# Patient Record
Sex: Female | Born: 1940 | Race: White | Hispanic: No | Marital: Married | Smoking: Never smoker
Health system: Southern US, Community
[De-identification: ages and names within clinical notes are randomized; demographics above are authoritative.]

## PROBLEM LIST (undated history)

## (undated) DIAGNOSIS — F419 Anxiety disorder, unspecified: Secondary | ICD-10-CM

## (undated) DIAGNOSIS — K219 Gastro-esophageal reflux disease without esophagitis: Secondary | ICD-10-CM

## (undated) DIAGNOSIS — G629 Polyneuropathy, unspecified: Secondary | ICD-10-CM

## (undated) DIAGNOSIS — I10 Essential (primary) hypertension: Secondary | ICD-10-CM

## (undated) DIAGNOSIS — I2510 Atherosclerotic heart disease of native coronary artery without angina pectoris: Secondary | ICD-10-CM

## (undated) DIAGNOSIS — M1990 Unspecified osteoarthritis, unspecified site: Secondary | ICD-10-CM

## (undated) DIAGNOSIS — T7840XA Allergy, unspecified, initial encounter: Secondary | ICD-10-CM

## (undated) DIAGNOSIS — J45909 Unspecified asthma, uncomplicated: Secondary | ICD-10-CM

## (undated) DIAGNOSIS — C801 Malignant (primary) neoplasm, unspecified: Secondary | ICD-10-CM

## (undated) DIAGNOSIS — F0390 Unspecified dementia without behavioral disturbance: Secondary | ICD-10-CM

## (undated) HISTORY — PX: ABDOMINAL HYSTERECTOMY: SHX81

## (undated) HISTORY — DX: Unspecified asthma, uncomplicated: J45.909

## (undated) HISTORY — DX: Polyneuropathy, unspecified: G62.9

## (undated) HISTORY — DX: Atherosclerotic heart disease of native coronary artery without angina pectoris: I25.10

## (undated) HISTORY — DX: Allergy, unspecified, initial encounter: T78.40XA

## (undated) HISTORY — DX: Gastro-esophageal reflux disease without esophagitis: K21.9

## (undated) HISTORY — DX: Unspecified osteoarthritis, unspecified site: M19.90

---

## 1965-06-25 HISTORY — PX: APPENDECTOMY: SHX54

## 2004-11-27 ENCOUNTER — Ambulatory Visit: Payer: Self-pay | Admitting: Internal Medicine

## 2005-05-24 ENCOUNTER — Ambulatory Visit: Payer: Self-pay | Admitting: Gastroenterology

## 2011-12-04 ENCOUNTER — Ambulatory Visit: Payer: Self-pay | Admitting: Ophthalmology

## 2011-12-18 ENCOUNTER — Ambulatory Visit: Payer: Self-pay | Admitting: Ophthalmology

## 2012-01-15 ENCOUNTER — Ambulatory Visit: Payer: Self-pay | Admitting: Ophthalmology

## 2012-08-28 ENCOUNTER — Encounter: Payer: Self-pay | Admitting: Internal Medicine

## 2012-08-28 ENCOUNTER — Ambulatory Visit (INDEPENDENT_AMBULATORY_CARE_PROVIDER_SITE_OTHER): Payer: Medicare Other | Admitting: Internal Medicine

## 2012-08-28 VITALS — BP 160/90 | HR 84 | Temp 97.8°F | Ht 61.5 in | Wt 131.5 lb

## 2012-08-28 DIAGNOSIS — K219 Gastro-esophageal reflux disease without esophagitis: Secondary | ICD-10-CM

## 2012-08-30 ENCOUNTER — Encounter: Payer: Self-pay | Admitting: Internal Medicine

## 2012-08-30 DIAGNOSIS — Z9109 Other allergy status, other than to drugs and biological substances: Secondary | ICD-10-CM | POA: Insufficient documentation

## 2012-08-30 DIAGNOSIS — Z91048 Other nonmedicinal substance allergy status: Secondary | ICD-10-CM | POA: Insufficient documentation

## 2012-08-30 NOTE — Assessment & Plan Note (Signed)
Had EGD previously.  Symptoms controlled.

## 2012-08-30 NOTE — Assessment & Plan Note (Signed)
Low cholesterol diet and exercise.  Check lipid panel.   

## 2012-08-30 NOTE — Progress Notes (Signed)
  Subjective:    Patient ID: Patricia Pacheco, female    DOB: 01/22/1941, 72 y.o.   MRN: [id number]  HPI 71 year old female with past history of GERD, allergies and hypercholesterolemia who comes in today to follow up on these issues as well as to establish care.  She states she has been doing relatively well.  Was previously seeing Dr Barnabas Lister.  He followed her mammograms and did her pelvics.  Stays active.  No cardiac symptoms with increased activity or exertion.  Previously saw Dr Juliann Pares.  Had negative stress test.  Eating and drinking well.  No nausea or vomiting.  Bowels stable. Does have some constipation.  Drinks prune juice and this helps to keep her regular.    Past Medical History  Diagnosis Date  . Asthma   . Arthritis   . GERD (gastroesophageal reflux disease)   . Allergy     allergy shots Elenore Rota)    Outpatient Encounter Prescriptions as of 08/28/2012  Medication Sig Dispense Refill  . aspirin 81 MG tablet Take 81 mg by mouth daily.      . Calcium Carbonate-Vit D-Min (CALCIUM 1200 PO) Take by mouth daily.      . Celecoxib (CELEBREX PO) Take by mouth as needed.      . COD LIVER OIL PO Take by mouth.      . Multiple Vitamin (MULTIVITAMIN) tablet Take 1 tablet by mouth daily.      . Naproxen Sodium (ALEVE PO) Take by mouth as needed.      . [DISCONTINUED] calcium & magnesium carbonates (MYLANTA) 311-232 MG per tablet Take 1 tablet by mouth daily.       No facility-administered encounter medications on file as of 08/28/2012.    Review of Systems Patient denies any headache, lightheadedness or dizziness.  Sees Dr Elenore Rota.  On allergy shots.  Does have frequent nose bleeds.  Seeing Dr Elenore Rota.  No chest pain, tightness or palpitations.  No increased shortness of breath, cough or congestion.  No nausea or vomiting.  No abdominal pain or cramping.  No bowel change, such as diarrhea, BRBPR or melana.  Constipation controlled with prune juice.  No urine change.        Objective:   Physical Exam Filed Vitals:   08/28/12 1314  BP: 160/90  Pulse: 84  Temp: 97.8 F (36.6 C)   Blood pressure recheck:  61/45  72 year old female in no acute distress.   HEENT:  Nares- clear.  Oropharynx - without lesions. NECK:  Supple.  Nontender.  No audible bruit.  HEART:  Appears to be regular. LUNGS:  No crackles or wheezing audible.  Respirations even and unlabored.  RADIAL PULSE:  Equal bilaterally.  ABDOMEN:  Soft, nontender.  Bowel sounds present and normal.  No audible abdominal bruit.  EXTREMITIES:  No increased edema present.  DP pulses palpable and equal bilaterally.          Assessment & Plan:  CARDIOVASCULAR.  Currently doing well.  Follow.  Had previous negative stress test.  FATIGUE.  Check cbc, met c and tsh.   GYN.  Previously followed by Dr Barnabas Lister.  Obtain records.  Schedule her to return for physical.   HEALTH MAINTENANCE.  Schedule a physical next visit.  Schedule mammogram.  Discussed need for colonoscopy.

## 2012-08-30 NOTE — Assessment & Plan Note (Signed)
Receiving allergy shots.  Seeing Dr Elenore Rota.  He is following her for her frequent nose bleeds.

## 2012-09-04 ENCOUNTER — Other Ambulatory Visit (INDEPENDENT_AMBULATORY_CARE_PROVIDER_SITE_OTHER): Payer: Medicare Other

## 2012-09-04 DIAGNOSIS — E7800 Pure hypercholesterolemia, unspecified: Secondary | ICD-10-CM

## 2012-09-04 DIAGNOSIS — R5383 Other fatigue: Secondary | ICD-10-CM

## 2012-09-04 LAB — COMPREHENSIVE METABOLIC PANEL
Albumin: 3.8 g/dL (ref 3.5–5.2)
Alkaline Phosphatase: 81 U/L (ref 39–117)
BUN: 20 mg/dL (ref 6–23)
Creatinine, Ser: 0.9 mg/dL (ref 0.4–1.2)
Glucose, Bld: 94 mg/dL (ref 70–99)
Potassium: 4.2 mEq/L (ref 3.5–5.1)
Total Bilirubin: 0.6 mg/dL (ref 0.3–1.2)

## 2012-09-04 LAB — CBC WITH DIFFERENTIAL/PLATELET
Basophils Relative: 0.4 % (ref 0.0–3.0)
Eosinophils Relative: 2.4 % (ref 0.0–5.0)
HCT: 36.4 % (ref 36.0–46.0)
Hemoglobin: 12.3 g/dL (ref 12.0–15.0)
Lymphs Abs: 1.4 10*3/uL (ref 0.7–4.0)
MCV: 88.8 fl (ref 78.0–100.0)
Monocytes Absolute: 0.3 10*3/uL (ref 0.1–1.0)
Neutro Abs: 3.2 10*3/uL (ref 1.4–7.7)
RBC: 4.1 Mil/uL (ref 3.87–5.11)
WBC: 5.1 10*3/uL (ref 4.5–10.5)

## 2012-09-04 LAB — LIPID PANEL
Cholesterol: 202 mg/dL — ABNORMAL HIGH (ref 0–200)
Total CHOL/HDL Ratio: 3
Triglycerides: 58 mg/dL (ref 0.0–149.0)

## 2012-09-05 ENCOUNTER — Encounter: Payer: Self-pay | Admitting: Internal Medicine

## 2012-09-05 DIAGNOSIS — M8580 Other specified disorders of bone density and structure, unspecified site: Secondary | ICD-10-CM

## 2012-09-18 ENCOUNTER — Telehealth: Payer: Self-pay | Admitting: Internal Medicine

## 2012-09-18 NOTE — Telephone Encounter (Signed)
Please advise 

## 2012-09-18 NOTE — Telephone Encounter (Signed)
Pt came in today brought her records from dr Barnabas Lister,  Pt also stated dr Lorin Picket was going to call her in a generic xanax  [facility]  [address]

## 2012-09-19 MED ORDER — ALPRAZOLAM 0.25 MG PO TABS: 0.2500 mg | ORAL_TABLET | ORAL | Status: DC | PRN

## 2012-09-19 NOTE — Telephone Encounter (Signed)
Need to confirm with pt that she has taken xanax previously.  (how often takes)?

## 2012-09-19 NOTE — Telephone Encounter (Signed)
Patient stated that she took the generic for xanax, the lowest dose, and it was prn.

## 2012-09-19 NOTE — Telephone Encounter (Signed)
Called in xanax .25mg  q day prn #20 with no refills

## 2012-10-02 ENCOUNTER — Encounter: Payer: Self-pay | Admitting: Internal Medicine

## 2012-10-10 ENCOUNTER — Encounter: Payer: Self-pay | Admitting: Internal Medicine

## 2012-10-10 DIAGNOSIS — M949 Disorder of cartilage, unspecified: Secondary | ICD-10-CM

## 2012-10-10 DIAGNOSIS — M8580 Other specified disorders of bone density and structure, unspecified site: Secondary | ICD-10-CM | POA: Insufficient documentation

## 2012-10-10 DIAGNOSIS — M899 Disorder of bone, unspecified: Secondary | ICD-10-CM | POA: Insufficient documentation

## 2012-10-13 ENCOUNTER — Encounter: Payer: Self-pay | Admitting: Internal Medicine

## 2012-10-13 ENCOUNTER — Ambulatory Visit (INDEPENDENT_AMBULATORY_CARE_PROVIDER_SITE_OTHER): Payer: Medicare Other | Admitting: Internal Medicine

## 2012-10-13 ENCOUNTER — Ambulatory Visit (INDEPENDENT_AMBULATORY_CARE_PROVIDER_SITE_OTHER)
Admission: RE | Admit: 2012-10-13 | Discharge: 2012-10-13 | Disposition: A | Discharge status: Home / Self Care | Payer: Medicare Other | Source: Ambulatory Visit | Attending: Internal Medicine | Admitting: Internal Medicine

## 2012-10-13 ENCOUNTER — Other Ambulatory Visit (HOSPITAL_COMMUNITY)
Admission: RE | Admit: 2012-10-13 | Discharge: 2012-10-13 | Disposition: A | Discharge status: Home / Self Care | Payer: Medicare Other | Source: Ambulatory Visit | Attending: Internal Medicine | Admitting: Internal Medicine

## 2012-10-13 VITALS — BP 142/78 | HR 78 | Temp 98.3°F | Resp 18 | Ht 62.0 in | Wt 133.5 lb

## 2012-10-13 DIAGNOSIS — M549 Dorsalgia, unspecified: Secondary | ICD-10-CM

## 2012-10-13 DIAGNOSIS — M949 Disorder of cartilage, unspecified: Secondary | ICD-10-CM

## 2012-10-13 DIAGNOSIS — M25552 Pain in left hip: Secondary | ICD-10-CM

## 2012-10-13 DIAGNOSIS — E7800 Pure hypercholesterolemia, unspecified: Secondary | ICD-10-CM

## 2012-10-13 DIAGNOSIS — M8580 Other specified disorders of bone density and structure, unspecified site: Secondary | ICD-10-CM

## 2012-10-13 DIAGNOSIS — M25559 Pain in unspecified hip: Secondary | ICD-10-CM

## 2012-10-13 DIAGNOSIS — Z9109 Other allergy status, other than to drugs and biological substances: Secondary | ICD-10-CM

## 2012-10-13 DIAGNOSIS — Z124 Encounter for screening for malignant neoplasm of cervix: Secondary | ICD-10-CM | POA: Insufficient documentation

## 2012-10-13 DIAGNOSIS — Z0000 Encounter for general adult medical examination without abnormal findings: Secondary | ICD-10-CM

## 2012-10-13 DIAGNOSIS — K219 Gastro-esophageal reflux disease without esophagitis: Secondary | ICD-10-CM

## 2012-10-16 ENCOUNTER — Encounter: Payer: Self-pay | Admitting: *Deleted

## 2012-10-17 ENCOUNTER — Telehealth: Payer: Self-pay | Admitting: *Deleted

## 2012-10-20 ENCOUNTER — Encounter: Payer: Self-pay | Admitting: Internal Medicine

## 2012-10-20 NOTE — Telephone Encounter (Signed)
error 

## 2012-10-20 NOTE — Progress Notes (Signed)
Subjective:    Patient ID: Patricia Pacheco, female    DOB: [birth year], 72 y.o.   MRN: [id number]  HPI 72 year old female with past history of GERD, allergies and hypercholesterolemia who comes in today to follow up on these issues as well as for a complete physical exam.  She states she has been doing relatively well.  Stays active.  No cardiac symptoms with increased activity or exertion.  Previously saw Dr Juliann Pares.  Had negative stress test.  Eating and drinking well.  No nausea or vomiting.  Bowels stable. Does have some constipation.  Drinks prune juice and this helps to keep her regular.  She stands on her feet a lot.  Describes a burning/stinging sensation in her feet.  Described as hot coals.  Some left hip pain.  She also reports since cataract surgery, her eyes have felt dry.  No dry mouth.   Seeing Dr Elenore Rota.  Receiving allergy shots.  No cough or congestion.     Past Medical History  Diagnosis Date  . Asthma   . Arthritis   . GERD (gastroesophageal reflux disease)     EGD (05/25/04) - slight presbyesophagus, mild esophagitis, mild gastritis, mild duodenitis - positive H. pylori.   . Allergy     allergy shots (Juengel)  . Neuropathy   . CAD (coronary artery disease)     cath 11/27/04 - moderated LAD disease    Outpatient Encounter Prescriptions as of 10/13/2012  Medication Sig Dispense Refill  . ALPRAZolam (XANAX) 0.25 MG tablet Take 1 tablet (0.25 mg total) by mouth every three (3) days as needed for sleep.  20 tablet  0  . aspirin 81 MG tablet Take 81 mg by mouth daily.      . Calcium Carbonate-Vit D-Min (CALCIUM 1200 PO) Take by mouth daily.      . Celecoxib (CELEBREX PO) Take by mouth as needed.      . COD LIVER OIL PO Take by mouth.      . Multiple Vitamin (MULTIVITAMIN) tablet Take 1 tablet by mouth daily.      . Naproxen Sodium (ALEVE PO) Take by mouth as needed.       No facility-administered encounter medications on file as of 10/13/2012.    Review of Systems Patient  denies any headache, lightheadedness or dizziness.  Sees Dr Elenore Rota.  On allergy shots.  Does have frequent nose bleeds.  Seeing Dr Elenore Rota.  No chest pain, tightness or palpitations.  No increased shortness of breath, cough or congestion.  No nausea or vomiting.  No abdominal pain or cramping.  No bowel change, such as diarrhea, BRBPR or melana.  Constipation controlled with prune juice.  No urine change.  Left hip pain.  Feet hot/burning sensation.  On her feet a lot.       Objective:   Physical Exam  Filed Vitals:   10/13/12 1329  BP: 142/78  Pulse: 78  Temp: 98.3 F (36.8 C)  Resp: 7   72 year old female in no acute distress.   HEENT:  Nares- clear.  Oropharynx - without lesions. NECK:  Supple.  Nontender.  No audible bruit.  HEART:  Appears to be regular. LUNGS:  No crackles or wheezing audible.  Respirations even and unlabored.  RADIAL PULSE:  Equal bilaterally.    BREASTS:  No nipple discharge or nipple retraction present.  Could not appreciate any distinct nodules or axillary adenopathy.  ABDOMEN:  Soft, nontender.  Bowel sounds present and normal.  No  audible abdominal bruit.  GU:  Normal external genitalia.  Vaginal vault without lesions.  Cervix identified.  Pap performed. Could not appreciate any adnexal masses or tenderness.   RECTAL:  Heme negative.   EXTREMITIES:  No increased edema present.  DP pulses palpable and equal bilaterally. NEURO:  Minimal decreased sensation toes/feet compared to legs.            Assessment & Plan:  CARDIOVASCULAR.  Currently doing well.  Follow.  Had previous negative stress test.  GYN.  Previously followed by Dr Barnabas Lister.  Pelvic today.    MSK.  Left hip pain.  Will check L-S spine xray and left hip xray.  Tylenol as directed.  Follow.  May need ortho referral or Pt referral.  With the foot burning/stinging.  Wants to hold on more medication.  Support hose.  Follow.      HEALTH MAINTENANCE.  Physical today.  Mammogram 09/04/12 - Birads  I.  Discussed need for colonoscopy.

## 2012-10-20 NOTE — Assessment & Plan Note (Signed)
Low cholesterol diet and exercise.  Follow lipid panel.   

## 2012-10-20 NOTE — Assessment & Plan Note (Signed)
Had EGD previously.  Symptoms controlled.

## 2012-10-20 NOTE — Assessment & Plan Note (Signed)
Receiving allergy shots.  Seeing Dr Elenore Rota.  He is following her for her frequent nose bleeds.

## 2012-10-20 NOTE — Assessment & Plan Note (Signed)
Calcium and vitamin D.  Follow.    

## 2012-10-21 ENCOUNTER — Other Ambulatory Visit (INDEPENDENT_AMBULATORY_CARE_PROVIDER_SITE_OTHER): Payer: Medicare Other

## 2012-10-21 DIAGNOSIS — Z1211 Encounter for screening for malignant neoplasm of colon: Secondary | ICD-10-CM

## 2012-10-22 ENCOUNTER — Other Ambulatory Visit: Payer: Self-pay | Admitting: *Deleted

## 2012-10-22 DIAGNOSIS — Z1211 Encounter for screening for malignant neoplasm of colon: Secondary | ICD-10-CM

## 2012-11-14 ENCOUNTER — Ambulatory Visit (INDEPENDENT_AMBULATORY_CARE_PROVIDER_SITE_OTHER): Payer: Medicare Other | Admitting: Internal Medicine

## 2012-11-14 ENCOUNTER — Encounter: Payer: Self-pay | Admitting: Internal Medicine

## 2012-11-14 VITALS — BP 120/70 | HR 79 | Temp 98.4°F | Ht 62.0 in | Wt 133.0 lb

## 2012-11-14 DIAGNOSIS — E7800 Pure hypercholesterolemia, unspecified: Secondary | ICD-10-CM

## 2012-11-14 DIAGNOSIS — M8580 Other specified disorders of bone density and structure, unspecified site: Secondary | ICD-10-CM

## 2012-11-14 DIAGNOSIS — M949 Disorder of cartilage, unspecified: Secondary | ICD-10-CM

## 2012-11-14 DIAGNOSIS — R030 Elevated blood-pressure reading, without diagnosis of hypertension: Secondary | ICD-10-CM

## 2012-11-14 DIAGNOSIS — K219 Gastro-esophageal reflux disease without esophagitis: Secondary | ICD-10-CM

## 2012-11-14 DIAGNOSIS — Z9109 Other allergy status, other than to drugs and biological substances: Secondary | ICD-10-CM

## 2012-11-16 ENCOUNTER — Encounter: Payer: Self-pay | Admitting: Internal Medicine

## 2012-11-16 DIAGNOSIS — R030 Elevated blood-pressure reading, without diagnosis of hypertension: Secondary | ICD-10-CM | POA: Insufficient documentation

## 2012-11-16 NOTE — Progress Notes (Signed)
Subjective:    Patient ID: Patricia Pacheco, female    DOB: Feb 21, 1941, 72 y.o.   MRN: [id number]  HPI 72 year old female with past history of GERD, allergies and hypercholesterolemia who comes in today for a scheduled follow up.  She states she has been doing relatively well.  Stays active.  No cardiac symptoms with increased activity or exertion.  Previously saw Dr Juliann Pares.  Had negative stress test.  Had a heart catheterization seven years ago.  States everything checked out fine.  Eating and drinking well.  No nausea or vomiting.  Bowels stable.  She stands on her feet a lot.  Still having foot pain.  Has seen Dr Charlsie Merles, Dr Orland Jarred and Dr Al Corpus.  No improvement.  Would like to see a chiropractor - Dr Jeannetta Ellis.  States she was told he could help her feet.  Still some left hip pain.  She feels like this is being aggravated by her feet.  Feels if she could get something to help her feet, her hip pain would resolve.  Desires no therapy for her back or hip.  Previous xray revealed some arthritis.  Seeing Dr Elenore Rota.  Receiving allergy shots.  No cough or congestion.     Past Medical History  Diagnosis Date  . Asthma   . Arthritis   . GERD (gastroesophageal reflux disease)     EGD (05/25/04) - slight presbyesophagus, mild esophagitis, mild gastritis, mild duodenitis - positive H. pylori.   . Allergy     allergy shots (Juengel)  . Neuropathy   . CAD (coronary artery disease)     cath 11/27/04 - moderated LAD disease    Outpatient Encounter Prescriptions as of 11/14/2012  Medication Sig Dispense Refill  . ALPRAZolam (XANAX) 0.25 MG tablet Take 1 tablet (0.25 mg total) by mouth every three (3) days as needed for sleep.  20 tablet  0  . aspirin 81 MG tablet Take 81 mg by mouth daily.      . Calcium Carbonate-Vit D-Min (CALCIUM 1200 PO) Take by mouth daily.      . Celecoxib (CELEBREX PO) Take by mouth as needed.      . COD LIVER OIL PO Take by mouth.      . fexofenadine (ALLEGRA) 180 MG tablet Take 180 mg  by mouth daily.      . Multiple Vitamin (MULTIVITAMIN) tablet Take 1 tablet by mouth daily.      . Naproxen Sodium (ALEVE PO) Take by mouth as needed.       No facility-administered encounter medications on file as of 11/14/2012.    Review of Systems Patient denies any headache, lightheadedness or dizziness.  Sees Dr Elenore Rota.  On allergy shots.  Does have frequent nose bleeds.  Seeing Dr Elenore Rota.  No chest pain, tightness or palpitations.  No increased shortness of breath, cough or congestion.  No nausea or vomiting.  No abdominal pain or cramping.  No bowel change, such as diarrhea, BRBPR or melana.  Constipation controlled with prune juice.  No urine change.  Left hip pain as outlined.  Desires no further intervention.  Feet still bother her.  Plans to see a chiropractor.   Overall she feels she is doing well. Blood pressures on outside checks averaging 120-140/70-80.       Objective:   Physical Exam  Filed Vitals:   11/14/12 1429  BP: 120/70  Pulse: 79  Temp: 98.4 F (36.9 C)   Blood pressure recheck: 40/2  72 year old female  in no acute distress.   HEENT:  Nares- clear.  Oropharynx - without lesions. NECK:  Supple.  Nontender.  No audible bruit.  HEART:  Appears to be regular. LUNGS:  No crackles or wheezing audible.  Respirations even and unlabored.  RADIAL PULSE:  Equal bilaterally.   ABDOMEN:  Soft, nontender.  Bowel sounds present and normal.  No audible abdominal bruit.    EXTREMITIES:  No increased edema present.  DP pulses palpable and equal bilaterally. MSK:  Minimal pulling and discomfort in her left hip with full extension of her left leg.           Assessment & Plan:  CARDIOVASCULAR.  Currently doing well.  Follow.  Had previous negative stress test.  Also reported heart cath (seven years ago) - looked good.  Follow.   MSK.  Left hip pain.  Had left hip xray and L-S spine xray.  Some arthritis.  Discussed referral to PT.  She wants to hold on this at this time.   Tylenol as directed.  Follow.  Wants to see the chiropractor for her feet.  Feels this will help her hip.       HEALTH MAINTENANCE.  Physical 10/13/12.  Mammogram 09/04/12 - Birads I.  Discussed need for colonoscopy last visit.

## 2012-11-16 NOTE — Assessment & Plan Note (Signed)
Receiving allergy shots.  Seeing Dr Juengel.  He is following her for her frequent nose bleeds.     

## 2012-11-16 NOTE — Assessment & Plan Note (Signed)
Blood pressure as outlined.  Continue to spot check - outside office.  Hold on medication.  Follow.

## 2012-11-16 NOTE — Assessment & Plan Note (Signed)
Had EGD previously.  Symptoms controlled.  

## 2012-11-16 NOTE — Assessment & Plan Note (Signed)
Low cholesterol diet and exercise.  Follow lipid panel.  Lipid panel checked 3/131/4 revealed total cholesterol 202, triglycerides 58, HDL 65 and LDL 115.    

## 2012-11-16 NOTE — Assessment & Plan Note (Signed)
Calcium and vitamin D.  Follow.

## 2013-01-20 ENCOUNTER — Ambulatory Visit: Payer: Medicare Other | Admitting: Pulmonary Disease

## 2013-02-17 ENCOUNTER — Ambulatory Visit: Payer: Medicare Other | Admitting: Internal Medicine

## 2013-04-14 ENCOUNTER — Ambulatory Visit: Payer: Medicare Other | Admitting: Internal Medicine

## 2013-04-23 ENCOUNTER — Ambulatory Visit (INDEPENDENT_AMBULATORY_CARE_PROVIDER_SITE_OTHER): Payer: Medicare Other | Admitting: Internal Medicine

## 2013-04-23 ENCOUNTER — Encounter: Payer: Self-pay | Admitting: Internal Medicine

## 2013-04-23 VITALS — BP 120/80 | HR 84 | Temp 98.5°F | Ht 62.0 in | Wt 128.8 lb

## 2013-04-23 DIAGNOSIS — M25552 Pain in left hip: Secondary | ICD-10-CM

## 2013-04-23 DIAGNOSIS — R030 Elevated blood-pressure reading, without diagnosis of hypertension: Secondary | ICD-10-CM

## 2013-04-23 DIAGNOSIS — Z9109 Other allergy status, other than to drugs and biological substances: Secondary | ICD-10-CM

## 2013-04-23 DIAGNOSIS — M25561 Pain in right knee: Secondary | ICD-10-CM

## 2013-04-23 DIAGNOSIS — E7800 Pure hypercholesterolemia, unspecified: Secondary | ICD-10-CM

## 2013-04-23 DIAGNOSIS — M899 Disorder of bone, unspecified: Secondary | ICD-10-CM

## 2013-04-23 DIAGNOSIS — IMO0001 Reserved for inherently not codable concepts without codable children: Secondary | ICD-10-CM

## 2013-04-23 DIAGNOSIS — M25569 Pain in unspecified knee: Secondary | ICD-10-CM

## 2013-04-23 DIAGNOSIS — M8580 Other specified disorders of bone density and structure, unspecified site: Secondary | ICD-10-CM

## 2013-04-23 DIAGNOSIS — K219 Gastro-esophageal reflux disease without esophagitis: Secondary | ICD-10-CM

## 2013-04-23 DIAGNOSIS — M25559 Pain in unspecified hip: Secondary | ICD-10-CM

## 2013-04-26 ENCOUNTER — Encounter: Payer: Self-pay | Admitting: Internal Medicine

## 2013-04-26 NOTE — Assessment & Plan Note (Signed)
Low cholesterol diet and exercise.  Follow lipid panel.  Lipid panel checked 3/131/4 revealed total cholesterol 202, triglycerides 58, HDL 65 and LDL 115.    

## 2013-04-26 NOTE — Progress Notes (Signed)
Subjective:    Patient ID: Patricia Pacheco, female    DOB: August 21, 1940, 72 y.o.   MRN: [id number]  HPI 72 year old female with past history of GERD, allergies and hypercholesterolemia who comes in today for a scheduled follow up.  She states she has been doing relatively well.  Stays active.  No cardiac symptoms with increased activity or exertion.  Previously saw Dr Juliann Pares.  Had negative stress test.  Had a heart catheterization seven years ago.  States everything checked out fine.  Eating and drinking well.  No nausea or vomiting.  Bowels stable.  She stands on her feet a lot.  Still having foot pain.  Has seen Dr Charlsie Merles, Dr Orland Jarred and Dr Al Corpus.  No improvement.  Would like to see a chiropractor - Dr Jeannetta Ellis.  States she was told he could help her feet.  She is contemplating going to see him.  Still some left hip pain.   Previous xray revealed some arthritis.  Seeing Dr Elenore Rota.  Receiving allergy shots.  No cough or congestion.  Overall doing relatively well.    Past Medical History  Diagnosis Date  . Asthma   . Arthritis   . GERD (gastroesophageal reflux disease)     EGD (05/25/04) - slight presbyesophagus, mild esophagitis, mild gastritis, mild duodenitis - positive H. pylori.   . Allergy     allergy shots (Juengel)  . Neuropathy   . CAD (coronary artery disease)     cath 11/27/04 - moderated LAD disease    Outpatient Encounter Prescriptions as of 04/23/2013  Medication Sig  . ALPRAZolam (XANAX) 0.25 MG tablet Take 1 tablet (0.25 mg total) by mouth every three (3) days as needed for sleep.  Marland Kitchen amoxicillin (AMOXIL) 875 MG tablet Take 875 mg by mouth 2 (two) times daily.  Marland Kitchen aspirin 81 MG tablet Take 81 mg by mouth daily.  . Calcium Carbonate-Vit D-Min (CALCIUM 1200 PO) Take by mouth daily.  . Celecoxib (CELEBREX PO) Take by mouth as needed.  . COD LIVER OIL PO Take by mouth.  . fexofenadine (ALLEGRA) 180 MG tablet Take 180 mg by mouth daily.  . Multiple Vitamin (MULTIVITAMIN) tablet Take 1  tablet by mouth daily.  . Naproxen Sodium (ALEVE PO) Take by mouth as needed.  . pseudoephedrine-guaifenesin (MUCINEX D) 60-600 MG per tablet Take 1 tablet by mouth every 12 (twelve) hours.    Review of Systems Patient denies any headache, lightheadedness or dizziness.  Sees Dr Elenore Rota.  On allergy shots.   No chest pain, tightness or palpitations.  No increased shortness of breath, cough or congestion.  No nausea or vomiting.  No abdominal pain or cramping.  No bowel change, such as diarrhea, BRBPR or melana.  Constipation controlled with prune juice.  No urine change.  Left hip pain as outlined.   Feet still bother her.  Plans to see a chiropractor.   Overall she feels she is doing well.       Objective:   Physical Exam  Filed Vitals:   04/23/13 0954  BP: 120/80  Pulse: 84  Temp: 98.5 F (36.9 C)   Blood pressure recheck: 69/11  72 year old female in no acute distress.   HEENT:  Nares- clear.  Oropharynx - without lesions. NECK:  Supple.  Nontender.  No audible bruit.  HEART:  Appears to be regular. LUNGS:  No crackles or wheezing audible.  Respirations even and unlabored.  RADIAL PULSE:  Equal bilaterally.   ABDOMEN:  Soft, nontender.  Bowel sounds present and normal.  No audible abdominal bruit.    EXTREMITIES:  No increased edema present.  DP pulses palpable and equal bilaterally. MSK:  Minimal pulling and discomfort in her left hip with full extension of her left leg.           Assessment & Plan:  CARDIOVASCULAR.  Currently doing well.  Follow.  Had previous negative stress test.  Also reported heart cath (seven years ago) - looked good.  Follow.   MSK.  Left hip pain.  Had left hip xray and L-S spine xray.  Some arthritis.  Discussed referral to PT.  She was agreeable.  Referral made.         HEALTH MAINTENANCE.  Physical 10/13/12.  Mammogram 09/04/12 - Birads I.  Have discussed need for colonoscopy.

## 2013-04-26 NOTE — Assessment & Plan Note (Signed)
Had EGD previously.  Symptoms controlled.

## 2013-04-26 NOTE — Assessment & Plan Note (Signed)
Receiving allergy shots.  Seeing Dr Elenore Rota.  He is following her for her frequent nose bleeds.  Symptoms controlled.

## 2013-04-26 NOTE — Assessment & Plan Note (Signed)
Blood pressure as outlined.  Continue to spot check - outside office.  Hold on medication.  Follow.  Better today.     

## 2013-04-26 NOTE — Assessment & Plan Note (Signed)
Continue vitamin D.  Follow.    

## 2013-06-23 ENCOUNTER — Telehealth: Payer: Self-pay | Admitting: Emergency Medicine

## 2013-06-23 ENCOUNTER — Ambulatory Visit: Payer: Medicare Other

## 2013-06-23 ENCOUNTER — Encounter (INDEPENDENT_AMBULATORY_CARE_PROVIDER_SITE_OTHER): Payer: Self-pay

## 2013-06-23 DIAGNOSIS — Z23 Encounter for immunization: Secondary | ICD-10-CM

## 2013-06-23 NOTE — Telephone Encounter (Signed)
Pt insurance changing to [employer]. Pt needs referral for [facility], [facility] and [facility]. Please advise as we do not have any referrals on file for the pt. We will also need a progress note or some documentation of why the patient needs to be seen by these other MD's.

## 2013-06-24 NOTE — Telephone Encounter (Signed)
See my last note.

## 2013-06-29 NOTE — Telephone Encounter (Signed)
Noted.  Just let me know what I need to do.

## 2013-06-29 NOTE — Telephone Encounter (Signed)
Referral for [facility] for the below MD's are in progress.

## 2013-07-02 NOTE — Telephone Encounter (Signed)
Patient has been approved to see Dr. Francisca December, 4 visits and the referral exp on 09/28/13. Patient was approved to see Dr. Kathyrn Sheriff for 4 visits and its exp 09/28/13. Pt does not need referral for eye glasses or eye exam.

## 2013-07-22 ENCOUNTER — Encounter: Payer: Self-pay | Admitting: Internal Medicine

## 2013-07-22 ENCOUNTER — Telehealth: Payer: Self-pay | Admitting: Internal Medicine

## 2013-07-22 ENCOUNTER — Ambulatory Visit (INDEPENDENT_AMBULATORY_CARE_PROVIDER_SITE_OTHER): Payer: Medicare HMO | Admitting: Internal Medicine

## 2013-07-22 VITALS — BP 130/90 | HR 81 | Temp 98.4°F | Ht 62.0 in | Wt 127.5 lb

## 2013-07-22 DIAGNOSIS — IMO0001 Reserved for inherently not codable concepts without codable children: Secondary | ICD-10-CM

## 2013-07-22 DIAGNOSIS — M25552 Pain in left hip: Secondary | ICD-10-CM

## 2013-07-22 DIAGNOSIS — M899 Disorder of bone, unspecified: Secondary | ICD-10-CM

## 2013-07-22 DIAGNOSIS — Z1211 Encounter for screening for malignant neoplasm of colon: Secondary | ICD-10-CM

## 2013-07-22 DIAGNOSIS — M79673 Pain in unspecified foot: Secondary | ICD-10-CM

## 2013-07-22 DIAGNOSIS — M949 Disorder of cartilage, unspecified: Secondary | ICD-10-CM

## 2013-07-22 DIAGNOSIS — M79609 Pain in unspecified limb: Secondary | ICD-10-CM

## 2013-07-22 DIAGNOSIS — Z9109 Other allergy status, other than to drugs and biological substances: Secondary | ICD-10-CM

## 2013-07-22 DIAGNOSIS — K219 Gastro-esophageal reflux disease without esophagitis: Secondary | ICD-10-CM

## 2013-07-22 DIAGNOSIS — R030 Elevated blood-pressure reading, without diagnosis of hypertension: Secondary | ICD-10-CM

## 2013-07-22 DIAGNOSIS — M8580 Other specified disorders of bone density and structure, unspecified site: Secondary | ICD-10-CM

## 2013-07-22 DIAGNOSIS — M25559 Pain in unspecified hip: Secondary | ICD-10-CM

## 2013-07-22 DIAGNOSIS — E7800 Pure hypercholesterolemia, unspecified: Secondary | ICD-10-CM

## 2013-07-22 MED ORDER — CELECOXIB 200 MG PO CAPS: 200.0000 mg | ORAL_CAPSULE | Freq: Every day | ORAL | Status: DC | PRN

## 2013-07-22 MED ORDER — ALPRAZOLAM 0.25 MG PO TABS: 0.2500 mg | ORAL_TABLET | Freq: Every day | ORAL | Status: DC | PRN

## 2013-07-22 NOTE — Progress Notes (Signed)
Pre-visit discussion using our clinic review tool. No additional management support is needed unless otherwise documented below in the visit note.  

## 2013-07-22 NOTE — Progress Notes (Signed)
Subjective:    Patient ID: Patricia Pacheco, female    DOB: Dec 17, 1940, 73 y.o.   MRN: [id number]  HPI 73 year old female with past history of GERD, allergies and hypercholesterolemia who comes in today for a scheduled follow up.  She states she has been doing relatively well.  Stays active.  No cardiac symptoms with increased activity or exertion.  Previously saw Dr Clayborn Bigness.  Had negative stress test.  Had a heart catheterization seven years ago.  States everything checked out fine.  Eating and drinking well.  No nausea or vomiting.  Bowels stable.  She stands on her feet a lot.  Still having foot pain.  Has seen Dr Paulla Dolly, Dr Elvina Mattes and Dr Milinda Pointer.  No improvement.  Now seeing a chiropractor - Dr Joyce Copa.  He is helping her pain.  He has been working with her hip.  Also has ordered pads for her feet.  Due to come in soon.  She notices improvement in her hip, until she is on her feet.  She is hoping if he can continue to work with her hip and help her with the pads and her feet, that she will note continued improvement.   Seeing Dr Kathyrn Sheriff.  Receiving allergy shots.  No cough or congestion.  Overall doing relatively well.  Recently evaluated by ENT.  Recommended sudafed for her congestion and stuffiness.  Was started on omeprazole for acid reflux.  Doing better.     Past Medical History  Diagnosis Date  . Asthma   . Arthritis   . GERD (gastroesophageal reflux disease)     EGD (05/25/04) - slight presbyesophagus, mild esophagitis, mild gastritis, mild duodenitis - positive H. pylori.   . Allergy     allergy shots (Juengel)  . Neuropathy   . CAD (coronary artery disease)     cath 11/27/04 - moderated LAD disease    Outpatient Encounter Prescriptions as of 07/22/2013  Medication Sig  . ALPRAZolam (XANAX) 0.25 MG tablet Take 1 tablet (0.25 mg total) by mouth every three (3) days as needed for sleep.  Marland Kitchen aspirin 81 MG tablet Take 81 mg by mouth daily.  . Calcium Carbonate-Vit D-Min (CALCIUM 1200 PO) Take by  mouth daily.  . Celecoxib (CELEBREX PO) Take by mouth as needed.  . COD LIVER OIL PO Take by mouth.  . fexofenadine (ALLEGRA) 180 MG tablet Take 180 mg by mouth daily.  . Multiple Vitamin (MULTIVITAMIN) tablet Take 1 tablet by mouth daily.  . Naproxen Sodium (ALEVE PO) Take by mouth as needed.  Marland Kitchen omeprazole (PRILOSEC) 40 MG capsule Take 40 mg by mouth daily.  . pseudoephedrine (SUDAFED) 60 MG tablet Take 60 mg by mouth every 4 (four) hours as needed for congestion.  . [DISCONTINUED] amoxicillin (AMOXIL) 875 MG tablet Take 875 mg by mouth 2 (two) times daily.  . [DISCONTINUED] pseudoephedrine-guaifenesin (MUCINEX D) 60-600 MG per tablet Take 1 tablet by mouth every 12 (twelve) hours.    Review of Systems Patient denies any headache, lightheadedness or dizziness.  Sees Dr Kathyrn Sheriff.  On allergy shots.   No chest pain, tightness or palpitations.  No increased shortness of breath, cough or congestion.  No nausea or vomiting.  No abdominal pain or cramping.  No bowel change, such as diarrhea, BRBPR or melana.  Constipation controlled with prune juice.  No urine change.  Left hip pain as outlined.   Feet still bother her.  Seeing a Restaurant manager, fast food.  See above.  Objective:   Physical Exam  Filed Vitals:   07/22/13 1339  BP: 130/90  Pulse: 81  Temp: 98.4 F (36.9 C)   Blood pressure recheck: 28/33  73 year old female in no acute distress.   HEENT:  Nares- clear.  Oropharynx - without lesions. NECK:  Supple.  Nontender.  No audible bruit.  HEART:  Appears to be regular. LUNGS:  No crackles or wheezing audible.  Respirations even and unlabored.  RADIAL PULSE:  Equal bilaterally.   ABDOMEN:  Soft, nontender.  Bowel sounds present and normal.  No audible abdominal bruit.    EXTREMITIES:  No increased edema present.  DP pulses palpable and equal bilaterally. MSK:  Minimal pulling and discomfort in her left hip with full extension of her left leg.           Assessment & Plan:   CARDIOVASCULAR.  Currently doing well.  Follow.  Had previous negative stress test.  Also reported heart cath (seven years ago) - looked good.  Follow.   MSK.  Left hip pain.  Had left hip xray and L-S spine xray.  Some arthritis.  Has tried physical therapy.  Seeing a chiropractor now.  Working with her hip and feet.  Planning to get pads for her feet.  Continue her f/u with the chiropractor.     HEALTH MAINTENANCE.  Physical 10/13/12.  Mammogram 09/04/12 - Birads I.  Have discussed need for colonoscopy.  She is agreeable.  Will refer to GI for evaluation.

## 2013-07-22 NOTE — Telephone Encounter (Signed)
Pt has already been approved 4 visits by her insurance company that expires in April- this was all that was approved. Since having the referral on file as this point, the office will need to contact us if any further visits are needed. I have lvm for the patient to call our office to make her aware.

## 2013-07-22 NOTE — Assessment & Plan Note (Addendum)
Blood pressure as outlined.  Continue to spot check - outside office.  Hold on medication.  Follow.  Better today.

## 2013-07-22 NOTE — Assessment & Plan Note (Addendum)
Low cholesterol diet and exercise.  Follow lipid panel.  Lipid panel checked 3/131/4 revealed total cholesterol 202, triglycerides 58, HDL 65 and LDL 115.

## 2013-07-22 NOTE — Telephone Encounter (Signed)
Pt came in today needing to get a referral for her allergy shots.  She gets these every week [facility]  They told her she would have to get approval for 5 visits in February  4 visit are for allergy shots and 1 is to see dr Elane Fritz in [place] Please advise pt when this is done  Pt wanted to know if she needs to do this every month since she gets shots every week ???

## 2013-07-24 ENCOUNTER — Ambulatory Visit: Payer: Medicare Other | Admitting: Internal Medicine

## 2013-07-26 ENCOUNTER — Encounter: Payer: Self-pay | Admitting: Internal Medicine

## 2013-07-26 NOTE — Assessment & Plan Note (Signed)
Had EGD previously.  On omeprazole.  Follow.   

## 2013-07-26 NOTE — Assessment & Plan Note (Signed)
Receiving allergy shots.  Seeing Dr Juengel.   Symptoms controlled.  Follow.    

## 2013-07-26 NOTE — Assessment & Plan Note (Signed)
Continue vitamin D and weight bearing exercise.  Follow.   

## 2013-07-29 NOTE — Telephone Encounter (Signed)
Pt was approved to see [facility] with 12 visits, exp 09/28/2013. Auth # [id number]

## 2013-07-30 NOTE — Telephone Encounter (Signed)
Patient has also been approved to see Dr. Vira Agar with 4 visits, exp 10/31/13. Auth # [id number]

## 2013-08-28 ENCOUNTER — Telehealth: Payer: Self-pay | Admitting: Emergency Medicine

## 2013-08-28 NOTE — Telephone Encounter (Signed)
Pt has been approved to see [facility] with 4 visits exp 11/27/13

## 2013-10-01 ENCOUNTER — Telehealth: Payer: Self-pay | Admitting: Emergency Medicine

## 2013-10-01 NOTE — Telephone Encounter (Signed)
Patient came in to ask for her referral to be extended for the next 3 months for [facility] for allergy shots. Referral underway to [facility]

## 2013-10-01 NOTE — Telephone Encounter (Signed)
Pt approved with 12 visits exp 12/28/13 to see [facility]. Auth # [id number]

## 2013-10-08 ENCOUNTER — Ambulatory Visit: Payer: Self-pay | Admitting: Gastroenterology

## 2013-10-08 LAB — HM COLONOSCOPY

## 2013-10-08 NOTE — Telephone Encounter (Signed)
Pt called anxious because she wants her allergy shots and [facility] is not scheduling b/c the dx code on the referral is different. They stated the dx code provided was for a kidney issue. I fixed this issue when the fax came back across on 10/01/13 and ask [employer] to fix the issue. They have fixed the issue per their office and have faxed the auth back. I have received it as well on Friday 10/02/13, it is in the pile to be scanned. I have spoken with [employer] again to ask them to re-fax the referral auth to [facility] so they can schedule the patient. I called [facility] to make them aware [employer] has faxed the updated dx code but same Auth # [id number], including the last time just now that it has been faxed. I explained to them that the pt is very anxious to get this shot and I would greatly appreciate if they schedule her asap.

## 2013-10-09 NOTE — Telephone Encounter (Signed)
Pt approved to se Dr. Candace Cruise with 4 visits exp 01/01/14. Auth # [id number]

## 2013-10-09 NOTE — Telephone Encounter (Signed)
Referral to [employer] underway for Dr.Oh

## 2013-10-14 ENCOUNTER — Encounter: Payer: Self-pay | Admitting: Internal Medicine

## 2013-10-15 ENCOUNTER — Encounter: Payer: Self-pay | Admitting: Internal Medicine

## 2013-10-15 DIAGNOSIS — K5730 Diverticulosis of large intestine without perforation or abscess without bleeding: Secondary | ICD-10-CM | POA: Insufficient documentation

## 2013-10-15 DIAGNOSIS — K5790 Diverticulosis of intestine, part unspecified, without perforation or abscess without bleeding: Secondary | ICD-10-CM | POA: Insufficient documentation

## 2013-10-29 ENCOUNTER — Telehealth: Payer: Self-pay

## 2013-10-29 ENCOUNTER — Ambulatory Visit (INDEPENDENT_AMBULATORY_CARE_PROVIDER_SITE_OTHER): Payer: Medicare HMO | Admitting: Internal Medicine

## 2013-10-29 ENCOUNTER — Encounter: Payer: Self-pay | Admitting: Internal Medicine

## 2013-10-29 VITALS — BP 130/80 | HR 74 | Temp 98.3°F | Ht 61.0 in | Wt 124.8 lb

## 2013-10-29 DIAGNOSIS — Z9109 Other allergy status, other than to drugs and biological substances: Secondary | ICD-10-CM

## 2013-10-29 DIAGNOSIS — L989 Disorder of the skin and subcutaneous tissue, unspecified: Secondary | ICD-10-CM

## 2013-10-29 DIAGNOSIS — K5730 Diverticulosis of large intestine without perforation or abscess without bleeding: Secondary | ICD-10-CM

## 2013-10-29 DIAGNOSIS — R079 Chest pain, unspecified: Secondary | ICD-10-CM

## 2013-10-29 DIAGNOSIS — E7800 Pure hypercholesterolemia, unspecified: Secondary | ICD-10-CM

## 2013-10-29 DIAGNOSIS — K5790 Diverticulosis of intestine, part unspecified, without perforation or abscess without bleeding: Secondary | ICD-10-CM

## 2013-10-29 DIAGNOSIS — M899 Disorder of bone, unspecified: Secondary | ICD-10-CM

## 2013-10-29 DIAGNOSIS — M949 Disorder of cartilage, unspecified: Secondary | ICD-10-CM

## 2013-10-29 DIAGNOSIS — IMO0001 Reserved for inherently not codable concepts without codable children: Secondary | ICD-10-CM

## 2013-10-29 DIAGNOSIS — K219 Gastro-esophageal reflux disease without esophagitis: Secondary | ICD-10-CM

## 2013-10-29 DIAGNOSIS — R030 Elevated blood-pressure reading, without diagnosis of hypertension: Secondary | ICD-10-CM

## 2013-10-29 DIAGNOSIS — M8580 Other specified disorders of bone density and structure, unspecified site: Secondary | ICD-10-CM

## 2013-10-29 NOTE — Progress Notes (Signed)
Pre visit review using our clinic review tool, if applicable. No additional management support is needed unless otherwise documented below in the visit note. 

## 2013-10-29 NOTE — Progress Notes (Addendum)
Subjective:    Patient ID: Patricia Pacheco, female    DOB: October 13, 1940, 73 y.o.   MRN: [id number]  HPI 73 year old female with past history of GERD, allergies and hypercholesterolemia who comes in today to follow up on these issues as well as for a complete physical exam.  She states she has been doing relatively well.  Has noticed some chest pain recently.  Feels related to stress.  Intermittent.   Previously saw Dr Clayborn Bigness.  Had negative stress test.  Had a heart catheterization seven years ago.  States everything checked out fine.  Eating and drinking well.  No nausea or vomiting.  Bowels stable.  She stands on her feet a lot.  Still having foot pain.  Has seen Dr Paulla Dolly, Dr Elvina Mattes and Dr Milinda Pointer. No improvement.  Now seeing a chiropractor - Dr Joyce Copa.  He is helping her pain.  He has been working with her hip.  Also has ordered pads for her feet. Seeing Dr Kathyrn Sheriff.  Receiving allergy shots.  No cough or congestion.  Recently evaluated by ENT.  Was started on omeprazole for acid reflux.  Doing better.     Past Medical History  Diagnosis Date  . Asthma   . Arthritis   . GERD (gastroesophageal reflux disease)     EGD (05/25/04) - slight presbyesophagus, mild esophagitis, mild gastritis, mild duodenitis - positive H. pylori.   . Allergy     allergy shots (Juengel)  . Neuropathy   . CAD (coronary artery disease)     cath 11/27/04 - moderated LAD disease    Outpatient Encounter Prescriptions as of 10/29/2013  Medication Sig  . ALPRAZolam (XANAX) 0.25 MG tablet Take 1 tablet (0.25 mg total) by mouth daily as needed for sleep.  Marland Kitchen aspirin 81 MG tablet Take 81 mg by mouth daily.  . Calcium Carbonate-Vit D-Min (CALCIUM 1200 PO) Take by mouth daily.  . celecoxib (CELEBREX) 200 MG capsule Take 1 capsule (200 mg total) by mouth daily as needed.  . COD LIVER OIL PO Take by mouth.  . fexofenadine (ALLEGRA) 180 MG tablet Take 180 mg by mouth daily.  . Multiple Vitamin (MULTIVITAMIN) tablet Take 1 tablet by  mouth daily.  . Naproxen Sodium (ALEVE PO) Take by mouth as needed.  Marland Kitchen omeprazole (PRILOSEC) 40 MG capsule Take 40 mg by mouth daily.  . pseudoephedrine (SUDAFED) 60 MG tablet Take 60 mg by mouth every 4 (four) hours as needed for congestion.    Review of Systems Patient denies any headache, lightheadedness or dizziness.  Sees Dr Kathyrn Sheriff.  On allergy shots.   Does report some chest pain.  She feels related to stress.   No increased shortness of breath, cough or congestion.  No nausea or vomiting.  No abdominal pain or cramping.  No bowel change, such as diarrhea, BRBPR or melana.  Constipation controlled with prune juice.  No urine change.  Seeing a Restaurant manager, fast food.  See above.        Objective:   Physical Exam  Filed Vitals:   10/29/13 1332  BP: 130/80  Pulse: 74  Temp: 98.3 F (80.43 C)   73 year old female in no acute distress.   HEENT:  Nares- clear.  Oropharynx - without lesions. NECK:  Supple.  Nontender.  No audible bruit.  HEART:  Appears to be regular. LUNGS:  No crackles or wheezing audible.  Respirations even and unlabored.  RADIAL PULSE:  Equal bilaterally.    BREASTS:  No nipple discharge or  nipple retraction present.  Could not appreciate any distinct nodules or axillary adenopathy.  ABDOMEN:  Soft, nontender.  Bowel sounds present and normal.  No audible abdominal bruit.  GU:  Not performed.    EXTREMITIES:  No increased edema present.  DP pulses palpable and equal bilaterally.          Assessment & Plan:  CARDIOVASCULAR.  Had previous negative stress test.  Also reported heart cath (seven years ago) - looked good.  Has noticed chest pain recently.  EKG obtained and revealed SR with no acute ischemic changes.  Given symptoms and given has been seven years since last cardiac evaluation, wanted to refer to cardiology for further evaluation.  She declined.  Wants to monitor for now.  Will notify me if she changes her mind or if problems.    MSK.  Left hip pain.  Had left  hip xray and L-S spine xray.  Some arthritis.  Has tried physical therapy.  Seeing a chiropractor now.  Has been working with her hip and feet.  Continues to f/u with her chiropractor.      HEALTH MAINTENANCE.  Physical today.  Mammogram 09/04/12 - Birads I.  She will schedule mammogram.  States had a colonoscopy.  Performed by Dr Candace Cruise.  Colonoscopy 10/08/13 - few small mouthed diverticula.

## 2013-11-02 ENCOUNTER — Encounter: Payer: Self-pay | Admitting: Internal Medicine

## 2013-11-02 NOTE — Assessment & Plan Note (Signed)
Low cholesterol diet and exercise.  Follow lipid panel.   

## 2013-11-02 NOTE — Assessment & Plan Note (Signed)
Had EGD previously.  On omeprazole.  Follow.   

## 2013-11-02 NOTE — Assessment & Plan Note (Signed)
Colonoscopy as outlined.  Recommended f/u colonoscopy in 5 years.

## 2013-11-02 NOTE — Assessment & Plan Note (Signed)
Receiving allergy shots.  Seeing Dr Juengel.   Symptoms controlled.  Follow.    

## 2013-11-02 NOTE — Assessment & Plan Note (Signed)
Continue vitamin D and weight bearing exercise.  Follow.   

## 2013-11-02 NOTE — Assessment & Plan Note (Signed)
Blood pressure as outlined.  Continue to spot check - outside office.  Hold on medication.  Follow.  Better today.

## 2013-11-04 DIAGNOSIS — L989 Disorder of the skin and subcutaneous tissue, unspecified: Secondary | ICD-10-CM | POA: Insufficient documentation

## 2013-11-04 NOTE — Telephone Encounter (Signed)
Error

## 2013-11-04 NOTE — Addendum Note (Signed)
Addended by: Alisa Graff on: 11/04/2013 06:20 PM   Modules accepted: Orders

## 2013-11-04 NOTE — Assessment & Plan Note (Signed)
Has multiple lesions on her legs that she would like evaluated.  Will refer to Dr Phillip Heal.

## 2013-11-10 ENCOUNTER — Telehealth: Payer: Self-pay | Admitting: Internal Medicine

## 2013-11-10 NOTE — Telephone Encounter (Signed)
Received fax from [facility] back Auth# [id number] Treating Provider Ana Benitez-Graham # of Visits 6 Start Date 5.20.15 End Date 8.18.15 CPT [phone] DX 709.9

## 2013-11-27 LAB — HM MAMMOGRAPHY: HM Mammogram: NEGATIVE

## 2013-12-01 ENCOUNTER — Encounter: Payer: Self-pay | Admitting: Internal Medicine

## 2013-12-07 ENCOUNTER — Telehealth: Payer: Self-pay

## 2013-12-07 NOTE — Telephone Encounter (Signed)
[employer] Faxed authorization for treating provider - Dr.Paul Kathyrn Sheriff  #[id number] 6 visits authorized - exp 06/03/14 DX- 995.3 Allergy

## 2013-12-07 NOTE — Telephone Encounter (Signed)
Noted  

## 2013-12-24 ENCOUNTER — Other Ambulatory Visit (INDEPENDENT_AMBULATORY_CARE_PROVIDER_SITE_OTHER): Payer: Medicare HMO

## 2013-12-24 DIAGNOSIS — IMO0001 Reserved for inherently not codable concepts without codable children: Secondary | ICD-10-CM

## 2013-12-24 DIAGNOSIS — M899 Disorder of bone, unspecified: Secondary | ICD-10-CM

## 2013-12-24 DIAGNOSIS — K219 Gastro-esophageal reflux disease without esophagitis: Secondary | ICD-10-CM

## 2013-12-24 DIAGNOSIS — M8580 Other specified disorders of bone density and structure, unspecified site: Secondary | ICD-10-CM

## 2013-12-24 DIAGNOSIS — M949 Disorder of cartilage, unspecified: Secondary | ICD-10-CM

## 2013-12-24 DIAGNOSIS — R030 Elevated blood-pressure reading, without diagnosis of hypertension: Secondary | ICD-10-CM

## 2013-12-24 DIAGNOSIS — E7800 Pure hypercholesterolemia, unspecified: Secondary | ICD-10-CM

## 2013-12-24 LAB — LIPID PANEL
Cholesterol: 196 mg/dL (ref 0–200)
HDL: 62 mg/dL (ref >39.00)
LDL CALC: 117 mg/dL — AB (ref 0–99)
NONHDL: 134
Total CHOL/HDL Ratio: 3
Triglycerides: 84 mg/dL (ref 0.0–149.0)
VLDL: 16.8 mg/dL (ref 0.0–40.0)

## 2013-12-24 LAB — COMPREHENSIVE METABOLIC PANEL
ALBUMIN: 4 g/dL (ref 3.5–5.2)
ALK PHOS: 77 U/L (ref 39–117)
ALT: 13 U/L (ref 0–35)
AST: 21 U/L (ref 0–37)
BILIRUBIN TOTAL: 0.5 mg/dL (ref 0.2–1.2)
BUN: 17 mg/dL (ref 6–23)
CO2: 31 mEq/L (ref 19–32)
Calcium: 9.4 mg/dL (ref 8.4–10.5)
Chloride: 106 mEq/L (ref 96–112)
Creatinine, Ser: 1 mg/dL (ref 0.4–1.2)
GFR: 60.33 mL/min (ref >60.00)
GLUCOSE: 96 mg/dL (ref 70–99)
POTASSIUM: 3.8 meq/L (ref 3.5–5.1)
SODIUM: 141 meq/L (ref 135–145)
TOTAL PROTEIN: 7.1 g/dL (ref 6.0–8.3)

## 2013-12-24 LAB — CBC WITH DIFFERENTIAL/PLATELET
BASOS ABS: 0 10*3/uL (ref 0.0–0.1)
Basophils Relative: 0.6 % (ref 0.0–3.0)
EOS ABS: 0.2 10*3/uL (ref 0.0–0.7)
Eosinophils Relative: 4.3 % (ref 0.0–5.0)
HEMATOCRIT: 35.8 % — AB (ref 36.0–46.0)
Hemoglobin: 12.1 g/dL (ref 12.0–15.0)
LYMPHS ABS: 1.7 10*3/uL (ref 0.7–4.0)
Lymphocytes Relative: 41 % (ref 12.0–46.0)
MCHC: 34 g/dL (ref 30.0–36.0)
MCV: 88.1 fl (ref 78.0–100.0)
MONO ABS: 0.3 10*3/uL (ref 0.1–1.0)
Monocytes Relative: 6.9 % (ref 3.0–12.0)
NEUTROS PCT: 47.2 % (ref 43.0–77.0)
Neutro Abs: 1.9 10*3/uL (ref 1.4–7.7)
PLATELETS: 174 10*3/uL (ref 150.0–400.0)
RBC: 4.06 Mil/uL (ref 3.87–5.11)
RDW: 13.8 % (ref 11.5–15.5)
WBC: 4.1 10*3/uL (ref 4.0–10.5)

## 2013-12-24 LAB — TSH: TSH: 2.06 u[IU]/mL (ref 0.35–4.50)

## 2013-12-25 LAB — VITAMIN D 25 HYDROXY (VIT D DEFICIENCY, FRACTURES): VIT D 25 HYDROXY: 35 ng/mL (ref 30–89)

## 2013-12-29 ENCOUNTER — Ambulatory Visit (INDEPENDENT_AMBULATORY_CARE_PROVIDER_SITE_OTHER): Payer: Medicare HMO | Admitting: Internal Medicine

## 2013-12-29 ENCOUNTER — Encounter: Payer: Self-pay | Admitting: Internal Medicine

## 2013-12-29 VITALS — BP 120/70 | HR 71 | Temp 98.3°F | Ht 61.0 in | Wt 126.5 lb

## 2013-12-29 DIAGNOSIS — M899 Disorder of bone, unspecified: Secondary | ICD-10-CM

## 2013-12-29 DIAGNOSIS — M8580 Other specified disorders of bone density and structure, unspecified site: Secondary | ICD-10-CM

## 2013-12-29 DIAGNOSIS — R030 Elevated blood-pressure reading, without diagnosis of hypertension: Secondary | ICD-10-CM

## 2013-12-29 DIAGNOSIS — L989 Disorder of the skin and subcutaneous tissue, unspecified: Secondary | ICD-10-CM

## 2013-12-29 DIAGNOSIS — M949 Disorder of cartilage, unspecified: Secondary | ICD-10-CM

## 2013-12-29 DIAGNOSIS — Z9109 Other allergy status, other than to drugs and biological substances: Secondary | ICD-10-CM

## 2013-12-29 DIAGNOSIS — R5383 Other fatigue: Secondary | ICD-10-CM

## 2013-12-29 DIAGNOSIS — IMO0001 Reserved for inherently not codable concepts without codable children: Secondary | ICD-10-CM

## 2013-12-29 DIAGNOSIS — R5381 Other malaise: Secondary | ICD-10-CM

## 2013-12-29 DIAGNOSIS — E7800 Pure hypercholesterolemia, unspecified: Secondary | ICD-10-CM

## 2013-12-29 DIAGNOSIS — K219 Gastro-esophageal reflux disease without esophagitis: Secondary | ICD-10-CM

## 2013-12-29 DIAGNOSIS — K5730 Diverticulosis of large intestine without perforation or abscess without bleeding: Secondary | ICD-10-CM

## 2013-12-29 NOTE — Progress Notes (Signed)
Pre visit review using our clinic review tool, if applicable. No additional management support is needed unless otherwise documented below in the visit note. 

## 2013-12-31 ENCOUNTER — Telehealth: Payer: Self-pay

## 2013-12-31 NOTE — Telephone Encounter (Signed)
The patient called hoping to get her Mcarthur Rossetti authorized for shots.  I called the pt back to find out the exact shots she was getting and which dr she is using.  Hoping to hear back from the pt asap.   Thanks!

## 2014-01-03 ENCOUNTER — Encounter: Payer: Self-pay | Admitting: Internal Medicine

## 2014-01-03 DIAGNOSIS — R5381 Other malaise: Secondary | ICD-10-CM | POA: Insufficient documentation

## 2014-01-03 DIAGNOSIS — R5383 Other fatigue: Secondary | ICD-10-CM | POA: Insufficient documentation

## 2014-01-03 NOTE — Assessment & Plan Note (Signed)
Continue vitamin D and weight bearing exercise.  Follow.   

## 2014-01-03 NOTE — Assessment & Plan Note (Signed)
Seeing Dr Phillip Heal.

## 2014-01-03 NOTE — Assessment & Plan Note (Signed)
Low cholesterol diet and exercise.  Follow lipid panel.  Recent LDL 111.  Follow.

## 2014-01-03 NOTE — Progress Notes (Signed)
Subjective:    Patient ID: Patricia Pacheco, female    DOB: 09-Nov-1940, 73 y.o.   MRN: [id number]  HPI 73 year old female with past history of GERD, allergies and hypercholesterolemia who comes in today for a scheduled follow up.   She states she has been doing relatively well.  Had previously noticed some chest pain recently.  See last note for details.  Felt to be related to stress.  Intermittent.   Previously saw Dr Clayborn Bigness.  Had negative stress test.  Had a heart catheterization seven years ago.  States everything checked out fine.  Discussed with her today.  She reports fatigue, but no significant chest pain.  Still desires no further w/up.   Eating and drinking well.  No nausea or vomiting.  Bowels stable. Seeing Dr Kathyrn Sheriff.  Receiving allergy shots.  No cough or congestion.  On omeprazole for acid reflux.  Doing better.  Injured her left index finger.  Had stitches.  Removed.  Still some tender.  Overall better.     Past Medical History  Diagnosis Date  . Asthma   . Arthritis   . GERD (gastroesophageal reflux disease)     EGD (05/25/04) - slight presbyesophagus, mild esophagitis, mild gastritis, mild duodenitis - positive H. pylori.   . Allergy     allergy shots (Juengel)  . Neuropathy   . CAD (coronary artery disease)     cath 11/27/04 - moderated LAD disease    Outpatient Encounter Prescriptions as of 12/29/2013  Medication Sig  . ALPRAZolam (XANAX) 0.25 MG tablet Take 1 tablet (0.25 mg total) by mouth daily as needed for sleep.  Marland Kitchen aspirin 81 MG tablet Take 81 mg by mouth daily.  . Calcium Carbonate-Vit D-Min (CALCIUM 1200 PO) Take by mouth daily.  . celecoxib (CELEBREX) 200 MG capsule Take 1 capsule (200 mg total) by mouth daily as needed.  . COD LIVER OIL PO Take by mouth.  . fexofenadine (ALLEGRA) 180 MG tablet Take 180 mg by mouth daily.  . Multiple Vitamin (MULTIVITAMIN) tablet Take 1 tablet by mouth daily.  . Naproxen Sodium (ALEVE PO) Take by mouth as needed.  Marland Kitchen omeprazole  (PRILOSEC) 40 MG capsule Take 40 mg by mouth daily.  . pseudoephedrine (SUDAFED) 60 MG tablet Take 60 mg by mouth every 4 (four) hours as needed for congestion.    Review of Systems Patient denies any headache, lightheadedness or dizziness.  Sees Dr Kathyrn Sheriff.  On allergy shots.   No significant chest pain reported today.   No increased shortness of breath, cough or congestion.  No nausea or vomiting.  No abdominal pain or cramping.  On omeprazole for acid reflux.  No bowel change, such as diarrhea, BRBPR or melana.  Constipation controlled with prune juice.  No urine change.  Fatigue as outlined.        Objective:   Physical Exam  Filed Vitals:   12/29/13 1139  BP: 120/70  Pulse: 71  Temp: 98.3 F (45.92 C)   73 year old female in no acute distress.   HEENT:  Nares- clear.  Oropharynx - without lesions. NECK:  Supple.  Nontender.  No audible bruit.  HEART:  Appears to be regular. LUNGS:  No crackles or wheezing audible.  Respirations even and unlabored.  RADIAL PULSE:  Equal bilaterally.   ABDOMEN:  Soft, nontender.  Bowel sounds present and normal.  No audible abdominal bruit.   EXTREMITIES:  No increased edema present.  DP pulses palpable and equal bilaterally.  Assessment & Plan:  CARDIOVASCULAR.  Had previous negative stress test.  Also reported heart cath (seven years ago) - looked good.  Given previous symptoms and given has been seven years since last cardiac evaluation, wanted to refer to cardiology for further evaluation.  She declined.  Discussed again today.  Wants to monitor for now.  Will notify me if she changes her mind or if problems.  No significant pain reported today.    MSK.  Left hip pain.  Had left hip xray and L-S spine xray.  Some arthritis.  Has tried physical therapy.  Has seen a chiropractor.  Appears to be stable.      HEALTH MAINTENANCE.  Physical 10/29/13.  Mammogram 11/27/13 - Birads I.   States had a colonoscopy.  Performed by Dr Candace Cruise.  Colonoscopy  10/08/13 - few small mouthed diverticula.     I spent 25 minutes with the patient and more than 50% of the time was spent in consultation regarding the above.

## 2014-01-03 NOTE — Assessment & Plan Note (Signed)
Felt to be multifactorial.  Recent cbc, tsh and metabolic panel ok.  She desires to monitor for now.  Follow.  Desires no further w/up.

## 2014-01-03 NOTE — Assessment & Plan Note (Signed)
Had EGD previously.  On omeprazole.  Follow.

## 2014-01-03 NOTE — Assessment & Plan Note (Signed)
Colonoscopy as outlined.  Recommended f/u colonoscopy in 5 years.  Due 09/2018.

## 2014-01-03 NOTE — Assessment & Plan Note (Signed)
Receiving allergy shots.  Seeing Dr Kathyrn Sheriff.   Symptoms controlled.  Follow.

## 2014-01-03 NOTE — Assessment & Plan Note (Signed)
Blood pressure as outlined.  Appears to be under reasonable control.  Continue to spot check - outside office.  Hold on medication.  Follow.

## 2014-04-13 ENCOUNTER — Telehealth: Payer: Self-pay | Admitting: Internal Medicine

## 2014-04-13 NOTE — Telephone Encounter (Signed)
FYI

## 2014-04-13 NOTE — Telephone Encounter (Signed)
Patient Information:  Caller Name: [place]  Phone: [phone]  Patient: Patricia Pacheco  Gender: Female  DOB: Jun 29, 1940  Age: 73 Years  PCP: Einar Pheasant  Office Follow Up:  Does the office need to follow up with this patient?: No  Instructions For The Office: N/Pacheco   Symptoms  Reason For Call & Symptoms: Martin Majestic out to put sheets on plants Sunday 10/18 to prevent freeze.  Dropped Pacheco sheet and felt it get tangled around feet, knew she was going down - landed on concrete rocki proch.  Landed  on her Right side with injury to shoulder, arm, hip and side to pack of head hit hard.  Immediately knot came up so applied ice which helped headache and to decrease swelling, currently size of golf ball.  No LOC.  Has had headache since fall that was worse last night 10/19 and hurt to even lay head on pillow.  Better since getting up today 10/20.  Reviewed Health History In EMR: Yes  Reviewed Medications In EMR: Yes  Reviewed Allergies In EMR: Yes  Reviewed Surgeries / Procedures: Yes  Date of Onset of Symptoms: 04/11/2014  Guideline(s) Used:  Head Injury  Disposition Per Guideline:   Home Care  Reason For Disposition Reached:   Minor head injury  Advice Given:  Reassurance - Direct Blow (Contusion, Bruise)  This sounds like Pacheco scalp injury rather than Pacheco brain injury or concussion. Treatment at home should be safe. Pacheco direct blow to your scalp can cause Pacheco contusion. Contusion is the medical term for bruise.  Symptoms are mild pain, swelling, and/or bruising. Sometimes there can also be mild dizziness and nausea.  Apply Pacheco Cold Pack:  Apply Pacheco cold pack or an ice bag (wrapped in Pacheco moist towel) to the area for 20 minutes. Repeat in 1 hour, then every 4 hours while awake.  Continue this for the first 48 hours after an injury.  This will help decrease pain and swelling.  Apply Heat to the Area:  Beginning 48 hours after an injury, apply Pacheco warm washcloth or heating pad for 10 minutes three times Pacheco  day.  Expected Course:  Most head trauma only causes an injury to the scalp. Pain, swelling, and bruising usually start to get better 2 to 3 days after an injury.  Swelling most often is gone after 1 week.  Bruises fade away slowly over 1-2 weeks.  It may take 2 weeks for pain and tenderness of the injured area to go away.  Call Back If:  Severe headache  Extremity weakness or numbness occurs  Slurred speech or blurred vision occurs  Vomiting occurs  You become worse.  Patient Will Follow Care Advice:  YES

## 2014-04-13 NOTE — Telephone Encounter (Signed)
Reviewed call a nurse message.  Pt needs to keep [place] posted.  If any persistent pain or problems, needs eval.  Call with update tomorrow.

## 2014-04-15 NOTE — Telephone Encounter (Signed)
LMTCB with update from yesterdays incident/call

## 2014-04-16 NOTE — Telephone Encounter (Signed)
Pt is still having headaches & soreness but they are improving daily. Doesn't feel that anything is broken. Pt states that she has followed CAN recommendations.

## 2014-04-16 NOTE — Telephone Encounter (Signed)
Pt states that she does not need to be seen.

## 2014-04-16 NOTE — Telephone Encounter (Signed)
Confirm pt does not feel needs to be seen.  If any acute change or problems, needs eval.

## 2014-04-27 ENCOUNTER — Emergency Department: Payer: Self-pay | Admitting: Emergency Medicine

## 2014-04-27 NOTE — Telephone Encounter (Signed)
If persistent headache, I do feel needs to be evaluated.  I am unable to work in today, but with the fall and persistent headache, I would like for her to be seen today.  Urgent care and I can f/u after.  Thanks.

## 2014-04-27 NOTE — Telephone Encounter (Signed)
Left message for pt to return my call.

## 2014-04-27 NOTE — Telephone Encounter (Signed)
Patient Information:  Caller Name: [place]  Phone: [phone]  Patient: Jake Bathe  Gender: Female  DOB: August 17, 1940  Age: 73 Years  PCP: Einar Pheasant  Office Follow Up:  Does the office need to follow up with this patient?: Yes  Instructions For The Office: Please call for an appointment today or tomorrow. Pt would like to be seen.  RN Note:  Pt is calling back after a fall on 04/11/14 she she got tangled up in a sheet while winertizing her flowers. Hit very hard on her R side. Bruising to her R side has lessened. Still having a mild headache.   Symptoms  Reason For Call & Symptoms: Fall  Reviewed Health History In EMR: Yes  Reviewed Medications In EMR: Yes  Reviewed Allergies In EMR: Yes  Reviewed Surgeries / Procedures: Yes  Date of Onset of Symptoms: 04/17/2014  Guideline(s) Used:  Head Injury  Disposition Per Guideline:   See Today or Tomorrow in Office  Reason For Disposition Reached:   After 3 days and headache persists  Advice Given:  Call Back If:  Severe headache  Extremity weakness or numbness occurs  Slurred speech or blurred vision occurs  Vomiting occurs  You become worse.  Patient Will Follow Care Advice:  YES

## 2014-04-27 NOTE — Telephone Encounter (Signed)
Please advise 

## 2014-04-27 NOTE — Telephone Encounter (Signed)
Pt notified and  verbalized understanding. Will call back to schedule follow up after UC evaluation today.

## 2014-04-28 ENCOUNTER — Telehealth: Payer: Self-pay | Admitting: Internal Medicine

## 2014-04-28 NOTE — Telephone Encounter (Signed)
Pt called to stated that she was seen in the ER after falling and hitting her head. Pt stated that she had spoken with the triage nurse and Latoya-CMA who advise her to go to the ER. Pt stated she has a CT scan and was diagnosed with a concussion/msn

## 2014-04-28 NOTE — Telephone Encounter (Signed)
Sent for records

## 2014-04-28 NOTE — Telephone Encounter (Signed)
Pt states that no appt is needed at this time.

## 2014-04-28 NOTE — Telephone Encounter (Signed)
Ok.  Thanks.  I am glad that she went and now we know CT ok.  Does she need f/u with me?  Keep [place] posted.

## 2014-05-03 ENCOUNTER — Ambulatory Visit: Payer: Medicare HMO

## 2014-05-03 ENCOUNTER — Ambulatory Visit (INDEPENDENT_AMBULATORY_CARE_PROVIDER_SITE_OTHER): Payer: Medicare HMO | Admitting: *Deleted

## 2014-05-03 ENCOUNTER — Other Ambulatory Visit (INDEPENDENT_AMBULATORY_CARE_PROVIDER_SITE_OTHER): Payer: Medicare HMO

## 2014-05-03 DIAGNOSIS — E7800 Pure hypercholesterolemia, unspecified: Secondary | ICD-10-CM

## 2014-05-03 DIAGNOSIS — Z23 Encounter for immunization: Secondary | ICD-10-CM

## 2014-05-03 LAB — LIPID PANEL
CHOL/HDL RATIO: 3
CHOLESTEROL: 194 mg/dL (ref 0–200)
HDL: 55.6 mg/dL (ref >39.00)
LDL Cholesterol: 123 mg/dL — ABNORMAL HIGH (ref 0–99)
NonHDL: 138.4
TRIGLYCERIDES: 77 mg/dL (ref 0.0–149.0)
VLDL: 15.4 mg/dL (ref 0.0–40.0)

## 2014-05-03 LAB — COMPREHENSIVE METABOLIC PANEL
ALT: 23 U/L (ref 0–35)
AST: 24 U/L (ref 0–37)
Albumin: 3.6 g/dL (ref 3.5–5.2)
Alkaline Phosphatase: 75 U/L (ref 39–117)
BUN: 20 mg/dL (ref 6–23)
CALCIUM: 9.3 mg/dL (ref 8.4–10.5)
CHLORIDE: 107 meq/L (ref 96–112)
CO2: 24 mEq/L (ref 19–32)
Creatinine, Ser: 0.8 mg/dL (ref 0.4–1.2)
GFR: 80.14 mL/min (ref >60.00)
Glucose, Bld: 88 mg/dL (ref 70–99)
POTASSIUM: 3.7 meq/L (ref 3.5–5.1)
SODIUM: 142 meq/L (ref 135–145)
Total Bilirubin: 0.6 mg/dL (ref 0.2–1.2)
Total Protein: 7.1 g/dL (ref 6.0–8.3)

## 2014-05-04 ENCOUNTER — Ambulatory Visit: Payer: Medicare HMO

## 2014-05-05 ENCOUNTER — Ambulatory Visit: Payer: Medicare HMO | Admitting: Internal Medicine

## 2014-05-10 ENCOUNTER — Telehealth: Payer: Self-pay | Admitting: Internal Medicine

## 2014-05-10 NOTE — Telephone Encounter (Signed)
left msg to call office to reschedule appt 11/16.msn

## 2014-05-27 ENCOUNTER — Ambulatory Visit (INDEPENDENT_AMBULATORY_CARE_PROVIDER_SITE_OTHER): Payer: Medicare HMO | Admitting: Internal Medicine

## 2014-05-27 VITALS — BP 130/80 | HR 83 | Temp 98.5°F | Ht 61.0 in | Wt 128.5 lb

## 2014-05-27 DIAGNOSIS — K219 Gastro-esophageal reflux disease without esophagitis: Secondary | ICD-10-CM

## 2014-05-27 DIAGNOSIS — J019 Acute sinusitis, unspecified: Secondary | ICD-10-CM

## 2014-05-27 MED ORDER — AMOXICILLIN-POT CLAVULANATE 875-125 MG PO TABS: 1.0000 | ORAL_TABLET | Freq: Two times a day (BID) | ORAL | Status: DC

## 2014-05-27 NOTE — Progress Notes (Signed)
Pre visit review using our clinic review tool, if applicable. No additional management support is needed unless otherwise documented below in the visit note. 

## 2014-05-27 NOTE — Patient Instructions (Signed)
Saline nasal spray - flush nose at least 2-3x/day  nasacort nasal spray - 2 sprays each nostril one time per day.  Do this in the evening.   Robitussin as directed.   

## 2014-05-31 ENCOUNTER — Encounter: Payer: Self-pay | Admitting: Internal Medicine

## 2014-05-31 DIAGNOSIS — J329 Chronic sinusitis, unspecified: Secondary | ICD-10-CM | POA: Insufficient documentation

## 2014-05-31 NOTE — Progress Notes (Signed)
Subjective:    Patient ID: Patricia Pacheco, female    DOB: [birth year], 73 y.o.   MRN: [id number]  Sinusitis  73 year old female with past history of GERD, allergies and hypercholesterolemia who comes in today as a work in with concerns regarding a sinus infection.   She states she has been doing relatively well.  Has problems intermittently with sinus infections.  States her current symptoms feel similar.  Has noticed increased post nasal drainage.  Increased nasal dripping.  Increased drainage.  Increased nasal congestion.  Some cough - productive of yellow mucus.  No sob or chest tightness.  Right ear discomfort.  No fever.  No vomiting.  Has been taking tylenol.       Past Medical History  Diagnosis Date  . Asthma   . Arthritis   . GERD (gastroesophageal reflux disease)     EGD (05/25/04) - slight presbyesophagus, mild esophagitis, mild gastritis, mild duodenitis - positive H. pylori.   . Allergy     allergy shots (Juengel)  . Neuropathy   . CAD (coronary artery disease)     cath 11/27/04 - moderated LAD disease    Outpatient Encounter Prescriptions as of 05/27/2014  Medication Sig  . ALPRAZolam (XANAX) 0.25 MG tablet Take 1 tablet (0.25 mg total) by mouth daily as needed for sleep.  Marland Kitchen aspirin 81 MG tablet Take 81 mg by mouth daily.  . Calcium Carbonate-Vit D-Min (CALCIUM 1200 PO) Take by mouth daily.  . celecoxib (CELEBREX) 200 MG capsule Take 1 capsule (200 mg total) by mouth daily as needed.  . COD LIVER OIL PO Take by mouth.  . fexofenadine (ALLEGRA) 180 MG tablet Take 180 mg by mouth daily.  . Multiple Vitamin (MULTIVITAMIN) tablet Take 1 tablet by mouth daily.  . Naproxen Sodium (ALEVE PO) Take by mouth as needed.  Marland Kitchen omeprazole (PRILOSEC) 40 MG capsule Take 40 mg by mouth daily.  . pseudoephedrine (SUDAFED) 60 MG tablet Take 60 mg by mouth every 4 (four) hours as needed for congestion.  Marland Kitchen amoxicillin-clavulanate (AUGMENTIN) 875-125 MG per tablet Take 1 tablet by mouth 2 (two)  times daily.    Review of Systems Patient denies any headache, lightheadedness or dizziness.  Sees Dr Kathyrn Sheriff.  On allergy shots.   Has the sinus symptoms as outlined.  Some increased cough.  Occasionally productive of yellow mucus.  No increased shortness of breath or chest tightness.   No nausea or vomiting.  Symptoms present over the last week and progressing.  Feels similar to her previous sinus infections.        Objective:   Physical Exam  Filed Vitals:   05/27/14 1640  BP: 130/80  Pulse: 83  Temp: 98.5 F (12.36 C)   73 year old female in no acute distress.   HEENT:  Nares- erythematous turbinates.  Oropharynx - without lesions.  Increased tenderness to palpation over the sinuses.  TMs visualized - no erythema.   NECK:  Supple.  Nontender.  HEART:  Appears to be regular. LUNGS:  No crackles or wheezing audible.  Respirations even and unlabored.         Assessment & Plan:  1. Acute sinusitis, recurrence not specified, unspecified location Treat with augmentin as directed.  Saline nasal spray and nasacort nasal spray as directed.  Unable to take mucinex, but tolerates robitussin.  Start robitussin as directed.  Rest.  Fluids.  Notify me if symptoms do not resolve.     2. Gastroesophageal reflux disease, esophagitis  presence not specified Controlled.

## 2014-06-02 ENCOUNTER — Ambulatory Visit: Payer: Medicare HMO | Admitting: Internal Medicine

## 2014-06-10 ENCOUNTER — Ambulatory Visit: Payer: Medicare HMO | Admitting: Internal Medicine

## 2014-07-26 ENCOUNTER — Encounter: Payer: Self-pay | Admitting: Internal Medicine

## 2014-07-26 ENCOUNTER — Ambulatory Visit (INDEPENDENT_AMBULATORY_CARE_PROVIDER_SITE_OTHER): Payer: PPO | Admitting: Internal Medicine

## 2014-07-26 VITALS — BP 132/70 | HR 82 | Temp 98.3°F | Ht 61.0 in

## 2014-07-26 DIAGNOSIS — E7800 Pure hypercholesterolemia, unspecified: Secondary | ICD-10-CM

## 2014-07-26 DIAGNOSIS — Z9109 Other allergy status, other than to drugs and biological substances: Secondary | ICD-10-CM

## 2014-07-26 DIAGNOSIS — Z91048 Other nonmedicinal substance allergy status: Secondary | ICD-10-CM

## 2014-07-26 DIAGNOSIS — IMO0001 Reserved for inherently not codable concepts without codable children: Secondary | ICD-10-CM

## 2014-07-26 DIAGNOSIS — M8580 Other specified disorders of bone density and structure, unspecified site: Secondary | ICD-10-CM

## 2014-07-26 DIAGNOSIS — R030 Elevated blood-pressure reading, without diagnosis of hypertension: Secondary | ICD-10-CM

## 2014-07-26 DIAGNOSIS — K219 Gastro-esophageal reflux disease without esophagitis: Secondary | ICD-10-CM

## 2014-07-26 MED ORDER — ALPRAZOLAM 0.25 MG PO TABS
0.2500 mg | ORAL_TABLET | Freq: Every day | ORAL | Status: DC | PRN
Start: 2014-07-26 — End: 2014-11-02

## 2014-07-26 NOTE — Progress Notes (Signed)
Pre visit review using our clinic review tool, if applicable. No additional management support is needed unless otherwise documented below in the visit note. 

## 2014-07-26 NOTE — Progress Notes (Signed)
Patient ID: Patricia Pacheco, female   DOB: 10-26-1940, 74 y.o.   MRN: [id number]   Subjective:    Patient ID: Patricia Pacheco, female    DOB: 1940/09/19, 74 y.o.   MRN: [id number]  HPI  Patient here for a scheduled follow up.  Has been having increased problems with allergy and sinus issues.  Recently treated for a sinus infection.  See last note for details.  Persistent increased drainage.  Feels her head is under water.  No fever.  Persistent issues despite nasal sprays and antihistamine.  Has seen Dr Kathyrn Sheriff previously.  Reports eating and drinking well.  Bowels stable.  Taking a stool softener.     Past Medical History  Diagnosis Date  . Asthma   . Arthritis   . GERD (gastroesophageal reflux disease)     EGD (05/25/04) - slight presbyesophagus, mild esophagitis, mild gastritis, mild duodenitis - positive H. pylori.   . Allergy     allergy shots (Juengel)  . Neuropathy   . CAD (coronary artery disease)     cath 11/27/04 - moderated LAD disease    Current Outpatient Prescriptions on File Prior to Visit  Medication Sig Dispense Refill  . aspirin 81 MG tablet Take 81 mg by mouth daily.    . Calcium Carbonate-Vit D-Min (CALCIUM 1200 PO) Take by mouth daily.    . celecoxib (CELEBREX) 200 MG capsule Take 1 capsule (200 mg total) by mouth daily as needed. 30 capsule 1  . COD LIVER OIL PO Take by mouth.    . fexofenadine (ALLEGRA) 180 MG tablet Take 180 mg by mouth daily.    . Multiple Vitamin (MULTIVITAMIN) tablet Take 1 tablet by mouth daily.    . Naproxen Sodium (ALEVE PO) Take by mouth as needed.    Marland Kitchen omeprazole (PRILOSEC) 40 MG capsule Take 40 mg by mouth daily.    . pseudoephedrine (SUDAFED) 60 MG tablet Take 60 mg by mouth every 4 (four) hours as needed for congestion.     No current facility-administered medications on file prior to visit.    Review of Systems  Constitutional: Negative for fever and unexpected weight change.  HENT: Positive for congestion, postnasal drip and sinus  pressure.        Feels like her head is under water.  Ear fullness.  No pain.   Respiratory: Negative for cough, chest tightness, shortness of breath and wheezing.   Cardiovascular: Negative for chest pain and palpitations.  Gastrointestinal: Negative for nausea, vomiting, abdominal pain and constipation (controlled with taking a stool softener).  Neurological: Negative for weakness and light-headedness.       Objective:    Physical Exam  Constitutional: She appears well-developed and well-nourished. No distress.  HENT:  Mouth/Throat: Oropharynx is clear and moist.  Erythematous turbinates.   Neck: Neck supple. No thyromegaly present.  Cardiovascular: Normal rate and regular rhythm.   Pulmonary/Chest: Breath sounds normal. No respiratory distress. She has no wheezes.  Abdominal: Soft. Bowel sounds are normal. There is no tenderness.  Musculoskeletal: She exhibits no edema or tenderness.  Lymphadenopathy:    She has no cervical adenopathy.  Skin: Skin is warm and dry. No rash noted.    BP 132/70 mmHg  Pulse 82  Temp(Src) 98.3 F (36.8 C) (Oral)  Ht 5\' 1"  (1.549 m)  SpO2 96%  LMP 08/29/1995 Wt Readings from Last 3 Encounters:  05/27/14 128 lb 8 oz (58.287 kg)  12/29/13 126 lb 8 oz (57.38 kg)  10/29/13 124 lb  12 oz (56.586 kg)     Lab Results  Component Value Date   WBC 4.1 12/24/2013   HGB 12.1 12/24/2013   HCT 35.8* 12/24/2013   PLT 174.0 12/24/2013   GLUCOSE 88 05/03/2014   CHOL 194 05/03/2014   TRIG 77.0 05/03/2014   HDL 55.60 05/03/2014   LDLDIRECT 115.0 09/04/2012   LDLCALC 123* 05/03/2014   ALT 23 05/03/2014   AST 24 05/03/2014   NA 142 05/03/2014   K 3.7 05/03/2014   CL 107 05/03/2014   CREATININE 0.8 05/03/2014   BUN 20 05/03/2014   CO2 24 05/03/2014   TSH 2.06 12/24/2013       Assessment & Plan:   Problem List Items Addressed This Visit    Elevated blood pressure    Blood pressure is doing well.  Follow.        Relevant Orders   Basic  metabolic panel   Environmental allergies    Persistent symptoms despite nasal sprays and antihistamine, etc.  Will refer back to ENT.  Has seen Dr Kathyrn Sheriff.        Relevant Orders   Ambulatory referral to ENT   GERD (gastroesophageal reflux disease) - Primary    Reports symptoms controlled.  Had EGD previously.  On omeprazole.        Osteopenia    Continue vitamin D and weight bearing exercise.  Last vitamin D level 12/2013 - wnl.       Pure hypercholesterolemia    Low cholesterol diet and exercise.  On niacin.  Last LDL 123.  Follow.        Relevant Orders   Hepatic function panel   Lipid panel     I spent 25 minutes with the patient and more than 50% of the time was spent in consultation regarding the above.     Einar Pheasant, MD

## 2014-07-28 ENCOUNTER — Encounter: Payer: Self-pay | Admitting: Internal Medicine

## 2014-07-28 NOTE — Assessment & Plan Note (Signed)
Reports symptoms controlled.  Had EGD previously.  On omeprazole.

## 2014-07-28 NOTE — Assessment & Plan Note (Signed)
Blood pressure is doing well.  Follow.

## 2014-07-28 NOTE — Assessment & Plan Note (Signed)
Continue vitamin D and weight bearing exercise.  Last vitamin D level 12/2013 - wnl.

## 2014-07-28 NOTE — Assessment & Plan Note (Signed)
Persistent symptoms despite nasal sprays and antihistamine, etc.  Will refer back to ENT.  Has seen Dr Kathyrn Sheriff.

## 2014-07-28 NOTE — Assessment & Plan Note (Signed)
Low cholesterol diet and exercise.  On niacin.  Last LDL 123.  Follow.

## 2014-10-17 NOTE — Op Note (Signed)
PATIENT NAME:  Patricia Pacheco, Patricia Pacheco MR#:  [id number] DATE OF BIRTH:  [birth year]  DATE OF PROCEDURE:  12/18/2011  PREOPERATIVE DIAGNOSIS: Visually significant cataract of the right eye.   POSTOPERATIVE DIAGNOSIS: Visually significant cataract of the right eye.   OPERATIVE PROCEDURE: Cataract extraction by phacoemulsification with implant of intraocular lens to right eye.   SURGEON: Birder Robson, Patricia Pacheco.   ANESTHESIA:  1. Managed anesthesia care.  2. Topical tetracaine drops followed by 2% Xylocaine jelly applied in the preoperative holding area.   COMPLICATIONS: None.   TECHNIQUE:  Stop-and-chop    DESCRIPTION OF PROCEDURE: The patient was examined and consented in the preoperative holding area where the aforementioned topical anesthesia was applied to the right eye and then brought back to the Operating Room where the right eye was prepped and draped in the usual sterile ophthalmic fashion and a lid speculum was placed. A paracentesis was created with the side port blade and the anterior chamber was filled with viscoelastic. A near clear corneal incision was performed with the steel keratome. A continuous curvilinear capsulorrhexis was performed with a cystotome followed by the capsulorrhexis forceps. Hydrodissection and hydrodelineation were carried out with BSS on a blunt cannula. The lens was removed in a stop-and-chop technique and the remaining cortical material was removed with the irrigation-aspiration handpiece. The capsular bag was inflated with viscoelastic and the Technus ZCB00 22.0-diopter lens, serial number [id number] was placed in the capsular bag without complication. The remaining viscoelastic was removed from the eye with the irrigation-aspiration handpiece. The wounds were hydrated. The anterior chamber was flushed with Miostat and the eye was inflated to physiologic pressure. The wounds were found to be water tight. The eye was dressed with Vigamox. The patient was given protective  glasses to wear throughout the day and a shield with which to sleep tonight. The patient was also given drops with which to begin a drop regimen today and will follow-up with me in one day.   ____________________________ Patricia Diones. Robinson Brinkley, Patricia Pacheco wlp:drc D: 12/18/2011 11:20:31 ET T: 12/18/2011 11:31:30 ET JOB#: [id number]  cc: Patricia Lahey L. Balian Schaller, Patricia Pacheco, <Dictator> Patricia Pacheco ELECTRONICALLY SIGNED 12/19/2011 15:07

## 2014-10-29 ENCOUNTER — Other Ambulatory Visit (INDEPENDENT_AMBULATORY_CARE_PROVIDER_SITE_OTHER): Payer: PPO

## 2014-10-29 ENCOUNTER — Telehealth: Payer: Self-pay | Admitting: Internal Medicine

## 2014-10-29 DIAGNOSIS — E7800 Pure hypercholesterolemia, unspecified: Secondary | ICD-10-CM

## 2014-10-29 DIAGNOSIS — IMO0001 Reserved for inherently not codable concepts without codable children: Secondary | ICD-10-CM

## 2014-10-29 DIAGNOSIS — R030 Elevated blood-pressure reading, without diagnosis of hypertension: Secondary | ICD-10-CM | POA: Diagnosis not present

## 2014-10-29 LAB — LIPID PANEL
Cholesterol: 193 mg/dL (ref 0–200)
HDL: 64 mg/dL (ref >39.00)
LDL Cholesterol: 105 mg/dL — ABNORMAL HIGH (ref 0–99)
NonHDL: 129
TRIGLYCERIDES: 120 mg/dL (ref 0.0–149.0)
Total CHOL/HDL Ratio: 3
VLDL: 24 mg/dL (ref 0.0–40.0)

## 2014-10-29 LAB — HEPATIC FUNCTION PANEL
ALBUMIN: 4 g/dL (ref 3.5–5.2)
ALK PHOS: 75 U/L (ref 39–117)
ALT: 15 U/L (ref 0–35)
AST: 20 U/L (ref 0–37)
BILIRUBIN DIRECT: 0.1 mg/dL (ref 0.0–0.3)
TOTAL PROTEIN: 6.7 g/dL (ref 6.0–8.3)
Total Bilirubin: 0.5 mg/dL (ref 0.2–1.2)

## 2014-10-29 LAB — BASIC METABOLIC PANEL
BUN: 16 mg/dL (ref 6–23)
CALCIUM: 9.3 mg/dL (ref 8.4–10.5)
CO2: 33 mEq/L — ABNORMAL HIGH (ref 19–32)
CREATININE: 0.86 mg/dL (ref 0.40–1.20)
Chloride: 105 mEq/L (ref 96–112)
GFR: 68.34 mL/min (ref >60.00)
GLUCOSE: 91 mg/dL (ref 70–99)
Potassium: 4 mEq/L (ref 3.5–5.1)
Sodium: 141 mEq/L (ref 135–145)

## 2014-10-29 NOTE — Telephone Encounter (Signed)
Pt wanted to know if she needed a referral for [facility]. Pt has an appt on 11/03/14. Please advise pt/msn

## 2014-10-29 NOTE — Telephone Encounter (Signed)
Per Lorriane Shire, no referral needed. Pt notified.

## 2014-11-01 ENCOUNTER — Ambulatory Visit (INDEPENDENT_AMBULATORY_CARE_PROVIDER_SITE_OTHER): Payer: PPO | Admitting: Internal Medicine

## 2014-11-01 ENCOUNTER — Encounter: Payer: Self-pay | Admitting: Internal Medicine

## 2014-11-01 VITALS — BP 152/78 | HR 76 | Temp 97.9°F | Ht 61.0 in | Wt 130.5 lb

## 2014-11-01 DIAGNOSIS — R51 Headache: Secondary | ICD-10-CM | POA: Diagnosis not present

## 2014-11-01 DIAGNOSIS — E7800 Pure hypercholesterolemia, unspecified: Secondary | ICD-10-CM

## 2014-11-01 DIAGNOSIS — M8580 Other specified disorders of bone density and structure, unspecified site: Secondary | ICD-10-CM

## 2014-11-01 DIAGNOSIS — R030 Elevated blood-pressure reading, without diagnosis of hypertension: Secondary | ICD-10-CM

## 2014-11-01 DIAGNOSIS — Z9109 Other allergy status, other than to drugs and biological substances: Secondary | ICD-10-CM

## 2014-11-01 DIAGNOSIS — R109 Unspecified abdominal pain: Secondary | ICD-10-CM | POA: Diagnosis not present

## 2014-11-01 DIAGNOSIS — H9191 Unspecified hearing loss, right ear: Secondary | ICD-10-CM | POA: Diagnosis not present

## 2014-11-01 DIAGNOSIS — Z0000 Encounter for general adult medical examination without abnormal findings: Secondary | ICD-10-CM

## 2014-11-01 DIAGNOSIS — F419 Anxiety disorder, unspecified: Secondary | ICD-10-CM | POA: Diagnosis not present

## 2014-11-01 DIAGNOSIS — Z91048 Other nonmedicinal substance allergy status: Secondary | ICD-10-CM

## 2014-11-01 DIAGNOSIS — R519 Headache, unspecified: Secondary | ICD-10-CM

## 2014-11-01 DIAGNOSIS — K219 Gastro-esophageal reflux disease without esophagitis: Secondary | ICD-10-CM

## 2014-11-01 DIAGNOSIS — IMO0001 Reserved for inherently not codable concepts without codable children: Secondary | ICD-10-CM

## 2014-11-01 MED ORDER — SERTRALINE HCL 50 MG PO TABS: 50.0000 mg | ORAL_TABLET | Freq: Every day | ORAL | Status: DC

## 2014-11-01 NOTE — Progress Notes (Signed)
Pre visit review using our clinic review tool, if applicable. No additional management support is needed unless otherwise documented below in the visit note. 

## 2014-11-01 NOTE — Progress Notes (Signed)
Patient ID: Patricia Pacheco, female   DOB: 1940-07-11, 74 y.o.   MRN: [id number]   Subjective:    Patient ID: Patricia Pacheco, female    DOB: [birth year], 74 y.o.   MRN: [id number]  HPI  Patient here for follow up on her current medical issues and for her physical exam.  She fell in 04/2014.  Was evaluated in ER.  Scan of head unrevealing.  Since the fall has continued to have persistent head pain and intermittent headache.  Headache is localized to left lateral head.  Has also noticed decreased hearing in left ear.  Saw Dr Kathyrn Sheriff.  Had hearing test.  Did have decreased hearing.  She also feels she has trouble remembering things.  Able to answer questions appropriately.  She has been receiving allergy injections from ENT.  Does not feel helping.  Still some allergy issues.  Continues to f/u with Dr Kathyrn Sheriff for this.  Also feels more anxious.  Anxious about the above - headache, etc.  Also increased anxiety with her husband's medical issues.  Feels she needs something to help level things out.  No depression.  Also reports lower pelvic/abdominal pain.  When eats, feels more bloated.  No nausea or vomiting.  Taking probiotic.  Helps some.  Acid appears to be controlled.  Bowels ok.  Takes stool softener.  Concern about prolapse.  Feels getting worse.  Request to see Dr Sharlett Iles.     Past Medical History  Diagnosis Date  . Asthma   . Arthritis   . GERD (gastroesophageal reflux disease)     EGD (05/25/04) - slight presbyesophagus, mild esophagitis, mild gastritis, mild duodenitis - positive H. pylori.   . Allergy     allergy shots (Juengel)  . Neuropathy   . CAD (coronary artery disease)     cath 11/27/04 - moderated LAD disease    Current Outpatient Prescriptions on File Prior to Visit  Medication Sig Dispense Refill  . aspirin 81 MG tablet Take 81 mg by mouth daily.    . Calcium Carbonate-Vit D-Min (CALCIUM 1200 PO) Take by mouth daily.    . celecoxib (CELEBREX) 200 MG capsule Take 1 capsule (200 mg  total) by mouth daily as needed. 30 capsule 1  . COD LIVER OIL PO Take by mouth.    . fexofenadine (ALLEGRA) 180 MG tablet Take 180 mg by mouth daily.    . Multiple Vitamin (MULTIVITAMIN) tablet Take 1 tablet by mouth daily.    . Naproxen Sodium (ALEVE PO) Take by mouth as needed.    . Omega-3 Fatty Acids (FISH OIL PO) Take by mouth daily.    . pseudoephedrine (SUDAFED) 60 MG tablet Take 60 mg by mouth every 4 (four) hours as needed for congestion.    . niacin 100 MG tablet Take 200 mg by mouth every morning.     No current facility-administered medications on file prior to visit.    Review of Systems  Constitutional: Negative for appetite change and unexpected weight change.  HENT: Positive for congestion and postnasal drip. Negative for sinus pressure.   Eyes: Negative for pain and visual disturbance.  Respiratory: Negative for cough, chest tightness and shortness of breath.   Cardiovascular: Negative for chest pain, palpitations and leg swelling.  Gastrointestinal: Positive for abdominal pain (lower abdominal pain and pelvic pain.). Negative for nausea, vomiting and diarrhea.  Genitourinary: Negative for dysuria and difficulty urinating.  Musculoskeletal: Negative for back pain and joint swelling.  Skin: Negative for color change and  rash.  Neurological: Positive for headaches. Negative for dizziness and light-headedness.  Hematological: Negative for adenopathy. Does not bruise/bleed easily.  Psychiatric/Behavioral: Negative for dysphoric mood and agitation.       Objective:    Physical Exam  Constitutional: She is oriented to person, place, and time. She appears well-developed and well-nourished. No distress.  HENT:  Nose: Nose normal.  Mouth/Throat: Oropharynx is clear and moist.  Eyes: Right eye exhibits no discharge. Left eye exhibits no discharge. No scleral icterus.  Neck: Neck supple. No thyromegaly present.  Cardiovascular: Normal rate and regular rhythm.     Pulmonary/Chest: Breath sounds normal. No accessory muscle usage. No tachypnea. No respiratory distress. She has no decreased breath sounds. She has no wheezes. She has no rhonchi. Right breast exhibits no inverted nipple, no mass, no nipple discharge and no tenderness (no axillary adenopathy). Left breast exhibits no inverted nipple, no mass, no nipple discharge and no tenderness (no axilarry adenopathy).  Abdominal: Soft. Bowel sounds are normal.  Minimal discomfort with palpation - lower abdomen/lower pelvic region.   Musculoskeletal: She exhibits no edema or tenderness.  Lymphadenopathy:    She has no cervical adenopathy.  Neurological: She is alert and oriented to person, place, and time.  Skin: Skin is warm. No rash noted.  Psychiatric: She has a normal mood and affect. Her behavior is normal.    BP 152/78 mmHg  Pulse 76  Temp(Src) 97.9 F (36.6 C)  Ht 5\' 1"  (1.549 m)  Wt 130 lb 8 oz (59.194 kg)  BMI 24.67 kg/m2  SpO2 99%  LMP 08/29/1995 Wt Readings from Last 3 Encounters:  11/01/14 130 lb 8 oz (59.194 kg)  05/27/14 128 lb 8 oz (58.287 kg)  12/29/13 126 lb 8 oz (57.38 kg)     Lab Results  Component Value Date   WBC 4.1 12/24/2013   HGB 12.1 12/24/2013   HCT 35.8* 12/24/2013   PLT 174.0 12/24/2013   GLUCOSE 91 10/29/2014   CHOL 193 10/29/2014   TRIG 120.0 10/29/2014   HDL 64.00 10/29/2014   LDLDIRECT 115.0 09/04/2012   LDLCALC 105* 10/29/2014   ALT 15 10/29/2014   AST 20 10/29/2014   NA 141 10/29/2014   K 4.0 10/29/2014   CL 105 10/29/2014   CREATININE 0.86 10/29/2014   BUN 16 10/29/2014   CO2 33* 10/29/2014   TSH 2.06 12/24/2013       Assessment & Plan:   Problem List Items Addressed This Visit    Abdominal pain    Abdominal pain/pelvic pain as outlined.  Bloated with eating.  Continue probiotic.  No nausea or vomiting.  Feels bowels stable.  Colonoscopy 10/08/13 - sigmoid diverticulosis otherwise normal.  Obtain pelvic ultrasound.  Also with prolapse,  refer to Dr Sharlett Iles for evaluation and to discuss treatment options.        Relevant Orders   US Pelvis Complete   Ambulatory referral to Urology   Anxiety    Discussed at length with her today.  Start zoloft as directed.  Follow.  Get her back in soon to reassess.        Relevant Medications   sertraline (ZOLOFT) 50 MG tablet   ALPRAZolam (XANAX) 0.25 MG tablet   Elevated blood pressure    Elevated today.  Better on recheck.  She feels related to increased anxiety.  Will treat with zoloft as outlined.  Have her spot check her pressure.  Get her back in soon to reassess.  Environmental allergies    Seeing Dr Kathyrn Sheriff.  Receiving allergy injections.  Feels not helping.  F/u with Dr Kathyrn Sheriff.       GERD (gastroesophageal reflux disease)    Appears to be controlled.       Relevant Medications   omeprazole (PRILOSEC) 40 MG capsule   Headache - Primary    Persistent right side headache since fall.  Also has noticed decreased hearing and does not feel she is remembering as well.  Given persistent pain and hearing change, etc - obtain MRI brain for further evaluation.  Further w/up pending.        Relevant Medications   sertraline (ZOLOFT) 50 MG tablet   Other Relevant Orders   MR [facility] care maintenance    Physical 11/01/14.  Mammogram 11/27/13 - Birads I.  Colonoscopy 10/08/13 - sigmoid diverticulosis otherwise normal.        Hearing loss    Feels hearing has decreased since her fall.  Saw Dr Kathyrn Sheriff.  Had hearing evaluation.  Obtain MRI as outlined.        Osteopenia    Continue vitamin D and weight bearing exercise.  Last vitamin D level 12/2013 wnl.       Pure hypercholesterolemia    Recent cholesterol check wnl.   Follow.           Einar Pheasant, MD

## 2014-11-02 ENCOUNTER — Encounter: Payer: Self-pay | Admitting: Internal Medicine

## 2014-11-02 DIAGNOSIS — H9190 Unspecified hearing loss, unspecified ear: Secondary | ICD-10-CM | POA: Insufficient documentation

## 2014-11-02 DIAGNOSIS — R51 Headache: Secondary | ICD-10-CM

## 2014-11-02 DIAGNOSIS — Z0000 Encounter for general adult medical examination without abnormal findings: Secondary | ICD-10-CM | POA: Insufficient documentation

## 2014-11-02 DIAGNOSIS — R519 Headache, unspecified: Secondary | ICD-10-CM | POA: Insufficient documentation

## 2014-11-02 DIAGNOSIS — R109 Unspecified abdominal pain: Secondary | ICD-10-CM | POA: Insufficient documentation

## 2014-11-02 DIAGNOSIS — F419 Anxiety disorder, unspecified: Secondary | ICD-10-CM | POA: Insufficient documentation

## 2014-11-02 MED ORDER — ALPRAZOLAM 0.25 MG PO TABS: 0.2500 mg | ORAL_TABLET | Freq: Every day | ORAL | Status: DC | PRN

## 2014-11-02 MED ORDER — OMEPRAZOLE 40 MG PO CPDR: 40.0000 mg | DELAYED_RELEASE_CAPSULE | Freq: Every day | ORAL | Status: DC

## 2014-11-02 NOTE — Assessment & Plan Note (Signed)
Abdominal pain/pelvic pain as outlined.  Bloated with eating.  Continue probiotic.  No nausea or vomiting.  Feels bowels stable.  Colonoscopy 10/08/13 - sigmoid diverticulosis otherwise normal.  Obtain pelvic ultrasound.  Also with prolapse, refer to Dr Sharlett Iles for evaluation and to discuss treatment options.

## 2014-11-02 NOTE — Assessment & Plan Note (Signed)
Discussed at length with her today.  Start zoloft as directed.  Follow.  Get her back in soon to reassess.

## 2014-11-02 NOTE — Assessment & Plan Note (Signed)
Feels hearing has decreased since her fall.  Saw Dr Kathyrn Sheriff.  Had hearing evaluation.  Obtain MRI as outlined.

## 2014-11-02 NOTE — Assessment & Plan Note (Signed)
Persistent right side headache since fall.  Also has noticed decreased hearing and does not feel she is remembering as well.  Given persistent pain and hearing change, etc - obtain MRI brain for further evaluation.  Further w/up pending.

## 2014-11-02 NOTE — Assessment & Plan Note (Signed)
Elevated today.  Better on recheck.  She feels related to increased anxiety.  Will treat with zoloft as outlined.  Have her spot check her pressure.  Get her back in soon to reassess.

## 2014-11-02 NOTE — Assessment & Plan Note (Signed)
Continue vitamin D and weight bearing exercise.  Last vitamin D level 12/2013 wnl.

## 2014-11-02 NOTE — Assessment & Plan Note (Signed)
Physical 11/01/14.  Mammogram 11/27/13 - Birads I.  Colonoscopy 10/08/13 - sigmoid diverticulosis otherwise normal.

## 2014-11-02 NOTE — Assessment & Plan Note (Signed)
Recent cholesterol check wnl.   Follow.

## 2014-11-02 NOTE — Assessment & Plan Note (Signed)
Appears to be controlled.

## 2014-11-02 NOTE — Assessment & Plan Note (Signed)
Seeing Dr Kathyrn Sheriff.  Receiving allergy injections.  Feels not helping.  F/u with Dr Kathyrn Sheriff.

## 2014-11-10 ENCOUNTER — Telehealth: Payer: Self-pay

## 2014-11-10 ENCOUNTER — Other Ambulatory Visit: Payer: Self-pay | Admitting: Internal Medicine

## 2014-11-10 DIAGNOSIS — R102 Pelvic and perineal pain: Secondary | ICD-10-CM

## 2014-11-10 NOTE — Telephone Encounter (Signed)
Order placed for transvaginal ultrasound.

## 2014-11-10 NOTE — Progress Notes (Signed)
Order placed for ultrasound (transvaginal)

## 2014-11-10 NOTE — Telephone Encounter (Signed)
Patient has [place] of Pel[facility] scheduled for tomorrow 5/19.  Patient needs an order for US transvaginal placed also per there request.  Please advise?

## 2014-11-11 ENCOUNTER — Ambulatory Visit
Admission: RE | Admit: 2014-11-11 | Discharge: 2014-11-11 | Disposition: A | Discharge status: Home / Self Care | Payer: PPO | Source: Ambulatory Visit | Attending: Internal Medicine | Admitting: Internal Medicine

## 2014-11-11 DIAGNOSIS — N858 Other specified noninflammatory disorders of uterus: Secondary | ICD-10-CM | POA: Diagnosis not present

## 2014-11-11 DIAGNOSIS — R102 Pelvic and perineal pain: Secondary | ICD-10-CM

## 2014-11-11 DIAGNOSIS — D259 Leiomyoma of uterus, unspecified: Secondary | ICD-10-CM | POA: Insufficient documentation

## 2014-11-11 DIAGNOSIS — R109 Unspecified abdominal pain: Secondary | ICD-10-CM

## 2014-11-11 DIAGNOSIS — R51 Headache: Secondary | ICD-10-CM

## 2014-11-11 DIAGNOSIS — R1030 Lower abdominal pain, unspecified: Secondary | ICD-10-CM | POA: Diagnosis present

## 2014-11-11 DIAGNOSIS — R519 Headache, unspecified: Secondary | ICD-10-CM

## 2014-11-11 MED ORDER — GADOBENATE DIMEGLUMINE 529 MG/ML IV SOLN
10.0000 mL | Freq: Once | INTRAVENOUS | Status: AC | PRN
  Administered 2014-11-11: 10 mL via INTRAVENOUS

## 2014-11-15 ENCOUNTER — Other Ambulatory Visit: Payer: Self-pay | Admitting: Internal Medicine

## 2014-11-15 DIAGNOSIS — N9489 Other specified conditions associated with female genital organs and menstrual cycle: Secondary | ICD-10-CM

## 2014-11-15 NOTE — Progress Notes (Signed)
Order placed for gyn referral for abnormal ultrasound - right adnexal mass

## 2014-11-18 ENCOUNTER — Telehealth: Payer: Self-pay | Admitting: *Deleted

## 2014-11-18 NOTE — Telephone Encounter (Signed)
Pt called states she would like to be seen at [facility].  The number she has is [phone].

## 2014-11-19 NOTE — Telephone Encounter (Signed)
[facility] is affiliated with [facility].  Would she be ok to see Dr Manuella Ghazi at [facility] or does she want to go to [facility].  Just let me know and I will place the order for the referral.

## 2014-11-19 NOTE — Telephone Encounter (Signed)
Spoke to pt, prefers to go to [facility], she is familiar with it as she has gone on multiple appts with her husband. Advised [facility] would call once an appt has been scheduled,  verbalized understanding

## 2014-11-20 ENCOUNTER — Other Ambulatory Visit: Payer: Self-pay | Admitting: Internal Medicine

## 2014-11-20 DIAGNOSIS — R519 Headache, unspecified: Secondary | ICD-10-CM

## 2014-11-20 DIAGNOSIS — R51 Headache: Principal | ICD-10-CM

## 2014-11-20 NOTE — Telephone Encounter (Signed)
Order placed for referral to neurology at [facility].

## 2014-11-20 NOTE — Progress Notes (Signed)
Persistent head pain and headache.  Order placed for referral to neurology.

## 2014-12-06 ENCOUNTER — Ambulatory Visit (INDEPENDENT_AMBULATORY_CARE_PROVIDER_SITE_OTHER): Payer: PPO | Admitting: Internal Medicine

## 2014-12-06 ENCOUNTER — Encounter: Payer: Self-pay | Admitting: Internal Medicine

## 2014-12-06 VITALS — BP 156/72 | HR 80 | Temp 98.0°F | Resp 14 | Ht 61.0 in | Wt 129.4 lb

## 2014-12-06 DIAGNOSIS — R51 Headache: Secondary | ICD-10-CM | POA: Diagnosis not present

## 2014-12-06 DIAGNOSIS — I10 Essential (primary) hypertension: Secondary | ICD-10-CM

## 2014-12-06 DIAGNOSIS — J3489 Other specified disorders of nose and nasal sinuses: Secondary | ICD-10-CM

## 2014-12-06 DIAGNOSIS — K219 Gastro-esophageal reflux disease without esophagitis: Secondary | ICD-10-CM | POA: Diagnosis not present

## 2014-12-06 DIAGNOSIS — R519 Headache, unspecified: Secondary | ICD-10-CM

## 2014-12-06 DIAGNOSIS — R109 Unspecified abdominal pain: Secondary | ICD-10-CM

## 2014-12-06 DIAGNOSIS — F419 Anxiety disorder, unspecified: Secondary | ICD-10-CM

## 2014-12-06 LAB — BASIC METABOLIC PANEL
BUN: 16 mg/dL (ref 6–23)
CHLORIDE: 105 meq/L (ref 96–112)
CO2: 32 meq/L (ref 19–32)
Calcium: 9.5 mg/dL (ref 8.4–10.5)
Creatinine, Ser: 0.76 mg/dL (ref 0.40–1.20)
GFR: 78.8 mL/min (ref >60.00)
Glucose, Bld: 94 mg/dL (ref 70–99)
Potassium: 3.9 mEq/L (ref 3.5–5.1)
SODIUM: 141 meq/L (ref 135–145)

## 2014-12-06 LAB — SEDIMENTATION RATE: Sed Rate: 18 mm/hr (ref 0–22)

## 2014-12-06 MED ORDER — LISINOPRIL 10 MG PO TABS: 10.0000 mg | ORAL_TABLET | Freq: Every day | ORAL | Status: DC

## 2014-12-06 MED ORDER — MUPIROCIN 2 % EX OINT: 1.0000 "application " | TOPICAL_OINTMENT | Freq: Two times a day (BID) | CUTANEOUS | Status: DC

## 2014-12-06 NOTE — Progress Notes (Signed)
Pre visit review using our clinic review tool, if applicable. No additional management support is needed unless otherwise documented below in the visit note. 

## 2014-12-06 NOTE — Progress Notes (Signed)
Patient ID: Patricia Pacheco, female   DOB: 09/25/1940, 74 y.o.   MRN: [id number]   Subjective:    Patient ID: Patricia Pacheco, female    DOB: 1940-11-24, 74 y.o.   MRN: [id number]  HPI  Patient here for a scheduled follow up.  She was recently evaluated with several concerns.  Had some lower pelvic discomfort.  Had pelvic ultrasound as outlined.  Uterine fibroids with right adnexal mass.  This was discussed with the patient.  She has an appt with Dr DeFrancesco - 12/29/14.  Already scheduled to see Dr Cheryl Flash.  Had questions about seeing both.  Due to see Dr Sharlett Iles this week.  Headache is better.  Had MRI - as outlined.  Still feels the constant pressure on top of her head.  Having some allergies.  Was wondering if this was contributing to her head pressure.  We had placed an order for referral.  She wants to see Dr Mickie Hillier.  Has seen opthalmology.  Eyes are dry.  Pressure is ok.  Also reports nasal soreness.  Bleeds intermittently.   Increased anxiety with all of this going on.  Started on zoloft a few days ago.     Past Medical History  Diagnosis Date  . Asthma   . Arthritis   . GERD (gastroesophageal reflux disease)     EGD (05/25/04) - slight presbyesophagus, mild esophagitis, mild gastritis, mild duodenitis - positive H. pylori.   . Allergy     allergy shots (Juengel)  . Neuropathy   . CAD (coronary artery disease)     cath 11/27/04 - moderated LAD disease    Current Outpatient Prescriptions on File Prior to Visit  Medication Sig Dispense Refill  . aspirin 81 MG tablet Take 81 mg by mouth daily.    . Calcium Carbonate-Vit D-Min (CALCIUM 1200 PO) Take by mouth daily.    . celecoxib (CELEBREX) 200 MG capsule Take 1 capsule (200 mg total) by mouth daily as needed. 30 capsule 1  . COD LIVER OIL PO Take by mouth.    . fexofenadine (ALLEGRA) 180 MG tablet Take 180 mg by mouth daily.    . Multiple Vitamin (MULTIVITAMIN) tablet Take 1 tablet by mouth daily.    . Naproxen Sodium  (ALEVE PO) Take by mouth as needed.    . niacin 100 MG tablet Take 200 mg by mouth every morning.    . Omega-3 Fatty Acids (FISH OIL PO) Take by mouth daily.    Marland Kitchen omeprazole (PRILOSEC) 40 MG capsule Take 1 capsule (40 mg total) by mouth daily. 30 capsule 2  . pseudoephedrine (SUDAFED) 60 MG tablet Take 60 mg by mouth every 4 (four) hours as needed for congestion.    . sertraline (ZOLOFT) 50 MG tablet Take 1 tablet (50 mg total) by mouth daily. (Patient taking differently: Take 25 mg by mouth daily. ) 30 tablet 1  . ALPRAZolam (XANAX) 0.25 MG tablet Take 1 tablet (0.25 mg total) by mouth daily as needed for sleep. (Patient not taking: Reported on 12/06/2014) 30 tablet 0   No current facility-administered medications on file prior to visit.    Review of Systems  Constitutional: Negative for appetite change and unexpected weight change.  HENT: Positive for nosebleeds (associated with nasal soreness). Negative for congestion.        Head pressure as outlined.  Overall pain better.    Eyes:       Dry eyes.   Respiratory: Negative for cough, chest tightness  and shortness of breath.   Cardiovascular: Negative for chest pain, palpitations and leg swelling.  Gastrointestinal: Negative for nausea, vomiting, abdominal pain and diarrhea.  Genitourinary: Negative for dysuria and difficulty urinating.  Skin: Negative for color change and rash.  Neurological: Positive for headaches (head pressure as outlined.  ). Negative for dizziness.  Hematological: Negative for adenopathy. Does not bruise/bleed easily.  Psychiatric/Behavioral:       Increased anxiety as outlined.         Objective:    Physical Exam  Constitutional: She appears well-developed and well-nourished. No distress.  HENT:  Mouth/Throat: Oropharynx is clear and moist.  Nasal sore - irritation.   Neck: Neck supple. No thyromegaly present.  Cardiovascular: Normal rate and regular rhythm.   Pulmonary/Chest: Breath sounds normal. No  respiratory distress. She has no wheezes.  Abdominal: Soft. Bowel sounds are normal. There is no tenderness.  Musculoskeletal: She exhibits no edema or tenderness.  Lymphadenopathy:    She has no cervical adenopathy.  Skin: No rash noted. No erythema.  Psychiatric: Her behavior is normal. Thought content normal.    BP 156/72 mmHg  Pulse 80  Temp(Src) 98 F (36.7 C) (Oral)  Resp 14  Ht 5\' 1"  (1.549 m)  Wt 129 lb 6.4 oz (58.695 kg)  BMI 24.46 kg/m2  SpO2 97%  LMP 08/29/1995 Wt Readings from Last 3 Encounters:  12/06/14 129 lb 6.4 oz (58.695 kg)  11/01/14 130 lb 8 oz (59.194 kg)  05/27/14 128 lb 8 oz (58.287 kg)     Lab Results  Component Value Date   WBC 4.1 12/24/2013   HGB 12.1 12/24/2013   HCT 35.8* 12/24/2013   PLT 174.0 12/24/2013   GLUCOSE 94 12/06/2014   CHOL 193 10/29/2014   TRIG 120.0 10/29/2014   HDL 64.00 10/29/2014   LDLDIRECT 115.0 09/04/2012   LDLCALC 105* 10/29/2014   ALT 15 10/29/2014   AST 20 10/29/2014   NA 141 12/06/2014   K 3.9 12/06/2014   CL 105 12/06/2014   CREATININE 0.76 12/06/2014   BUN 16 12/06/2014   CO2 32 12/06/2014   TSH 2.06 12/24/2013    Mr Brain W Wo Contrast  11/11/2014   CLINICAL DATA:  Normal normal headache.  Right-sided hearing loss  EXAM: MRI HEAD WITHOUT AND WITH CONTRAST  TECHNIQUE: Multiplanar, multiecho pulse sequences of the brain and surrounding structures were obtained without and with intravenous contrast.  CONTRAST:  67mL MULTIHANCE GADOBENATE DIMEGLUMINE 529 MG/ML IV SOLN  COMPARISON:  CT head 04/27/2014  FINDINGS: IAC protocol was performed including thin section imaging through the posterior fossa before and after intravenous contrast.  Seventh and eighth cranial nerves are normal. Negative for vestibular schwannoma. No mass lesion. Mastoid sinus clear. No cause for hearing loss identified  Moderate atrophy. Chronic microvascular ischemic changes are present in the white matter.  Negative for acute infarct.  Pituitary  normal in size.  Mild mucosal edema right maxillary sinus.  Negative for mass or edema. Normal enhancement postcontrast administration  IMPRESSION: Atrophy and chronic microvascular ischemia.  No acute abnormality.   Electronically Signed   By: Franchot Gallo M.D.   On: 11/11/2014 13:39   US Transvaginal Non-ob  11/11/2014   CLINICAL DATA:  Persistent pelvic and lower abdominal pain for the past 3 months; history of appendectomy  EXAM: TRANSABDOMINAL AND TRANSVAGINAL ULTRASOUND OF PELVIS  TECHNIQUE: Both transabdominal and transvaginal ultrasound examinations of the pelvis were performed. Transabdominal technique was performed for global imaging of the pelvis including  uterus, ovaries, adnexal regions, and pelvic cul-de-sac. It was necessary to proceed with endovaginal exam following the transabdominal exam to visualize the endometrium and adnexal structures.  COMPARISON:  None  FINDINGS: Uterus  Measurements: 6.0 x 3.5 x 4.4 cm. There are multiple hypoechoic structures containing calcifications in the uterine corpus. On the right 2 fibroids are visible with the largest measuring approximately 2.4 cm. On the left the largest measures 1.3 cm.  Endometrium  Thickness: 2.1 mm. The endometrium is mildly echogenic. No abnormal endometrial fluid collections are demonstrated.  Right ovary  The right ovary was not discretely demonstrated. However, there is a solid-appearing right adnexal mass measuring 3.8 x 2.8 x 3.8 cm. There is vascular flow internally and there are a few calcifications.  Left ovary  The left ovary could not be demonstrated. No left adnexal mass is demonstrated. There is no free pelvic fluid  Other findings  No free fluid.  IMPRESSION: 1. Multiple uterine fibroids. The endometrial stripe is not abnormally thickened. 2. Right adnexal mass without discrete demonstration of a normal ovary. This could reflect an enlarged right ovary or less likely be related to the adjacent uterus and reflected  pedunculated fibroid. The left ovary could not be demonstrated but no mass was observed in the left adnexal region. Further evaluation with pelvic MRI would be useful if the patient can undergo the procedure.   Electronically Signed   By: David  Martinique M.D.   On: 11/11/2014 15:07   US Pelvis Complete  11/11/2014   CLINICAL DATA:  Persistent pelvic and lower abdominal pain for the past 3 months; history of appendectomy  EXAM: TRANSABDOMINAL AND TRANSVAGINAL ULTRASOUND OF PELVIS  TECHNIQUE: Both transabdominal and transvaginal ultrasound examinations of the pelvis were performed. Transabdominal technique was performed for global imaging of the pelvis including uterus, ovaries, adnexal regions, and pelvic cul-de-sac. It was necessary to proceed with endovaginal exam following the transabdominal exam to visualize the endometrium and adnexal structures.  COMPARISON:  None  FINDINGS: Uterus  Measurements: 6.0 x 3.5 x 4.4 cm. There are multiple hypoechoic structures containing calcifications in the uterine corpus. On the right 2 fibroids are visible with the largest measuring approximately 2.4 cm. On the left the largest measures 1.3 cm.  Endometrium  Thickness: 2.1 mm. The endometrium is mildly echogenic. No abnormal endometrial fluid collections are demonstrated.  Right ovary  The right ovary was not discretely demonstrated. However, there is a solid-appearing right adnexal mass measuring 3.8 x 2.8 x 3.8 cm. There is vascular flow internally and there are a few calcifications.  Left ovary  The left ovary could not be demonstrated. No left adnexal mass is demonstrated. There is no free pelvic fluid  Other findings  No free fluid.  IMPRESSION: 1. Multiple uterine fibroids. The endometrial stripe is not abnormally thickened. 2. Right adnexal mass without discrete demonstration of a normal ovary. This could reflect an enlarged right ovary or less likely be related to the adjacent uterus and reflected pedunculated fibroid.  The left ovary could not be demonstrated but no mass was observed in the left adnexal region. Further evaluation with pelvic MRI would be useful if the patient can undergo the procedure.   Electronically Signed   By: David  Martinique M.D.   On: 11/11/2014 15:07       Assessment & Plan:   Problem List Items Addressed This Visit    Abdominal pain    Was having the abdominal pain and pelvic pain as outlined in last note.  Colonoscopy 10/08/13 - sigmoid diverticulosis, otherwise normal.  Pelvic ultrasound as outlined.  Uterine fibroids and right adnexal mass.  Due to see Dr Sharlett Iles this week.  Has appt scheduled with gyn in 12/2014.  Will see plan from Dr Sharlett Iles.  Follow.        Anxiety    Increased anxiety as outlined.  Started on zoloft.  Continue.        Essential hypertension    Blood pressure elevated.  States mostly averaging 140s at home.  Start lisinopril.  Schedule metabolic panel in 54-98 days.  Follow pressures.  Get her back in soon to reassess.       Relevant Medications   lisinopril (PRINIVIL,ZESTRIL) 10 MG tablet   Other Relevant Orders   Basic metabolic panel (Completed)   GERD (gastroesophageal reflux disease)    Controlled.        Headache - Primary    Persistent head pressure.  MRI as outlined.  Request referral to Dr Mickie Hillier.        Relevant Orders   Sedimentation rate (Completed)   Nasal lesion    bactroban as directed.  Notify me if persistent.          I spent 25 minutes with the patient and more than 50% of the time was spent in consultation regarding the above.     Einar Pheasant, MD

## 2014-12-07 ENCOUNTER — Encounter: Payer: Self-pay | Admitting: *Deleted

## 2014-12-09 ENCOUNTER — Telehealth: Payer: Self-pay | Admitting: *Deleted

## 2014-12-09 NOTE — Telephone Encounter (Signed)
Pt took Ultrasound to [facility] today & Dr. Sharlett Iles @ [facility] stated that she does need surgery & she could do it all. Pt will call & cancel appt with Dr. Cathlean Cower.

## 2014-12-13 ENCOUNTER — Encounter: Payer: Self-pay | Admitting: Internal Medicine

## 2014-12-13 ENCOUNTER — Telehealth: Payer: Self-pay | Admitting: Internal Medicine

## 2014-12-13 DIAGNOSIS — J3489 Other specified disorders of nose and nasal sinuses: Secondary | ICD-10-CM | POA: Insufficient documentation

## 2014-12-13 NOTE — Assessment & Plan Note (Signed)
Was having the abdominal pain and pelvic pain as outlined in last note.  Colonoscopy 10/08/13 - sigmoid diverticulosis, otherwise normal.  Pelvic ultrasound as outlined.  Uterine fibroids and right adnexal mass.  Due to see Dr Sharlett Iles this week.  Has appt scheduled with gyn in 12/2014.  Will see plan from Dr Sharlett Iles.  Follow.

## 2014-12-13 NOTE — Assessment & Plan Note (Signed)
Controlled.  

## 2014-12-13 NOTE — Assessment & Plan Note (Signed)
Persistent head pressure.  MRI as outlined.  Request referral to Dr Mickie Hillier.

## 2014-12-13 NOTE — Assessment & Plan Note (Addendum)
Blood pressure elevated.  States mostly averaging 140s at home.  Start lisinopril.  Schedule metabolic panel in 70-26 days.  Follow pressures.  Get her back in soon to reassess.

## 2014-12-13 NOTE — Assessment & Plan Note (Signed)
Increased anxiety as outlined.  Started on zoloft.  Continue.

## 2014-12-13 NOTE — Telephone Encounter (Signed)
I placed an order for neurology referral.  Pt has changed her mind and now wants to see Dr Mickie Hillier - [facility] (instead of Dr Manuella Ghazi).  Please schedule.  Order for referral in system.  Thanks

## 2014-12-13 NOTE — Assessment & Plan Note (Signed)
bactroban as directed.  Notify me if persistent.

## 2014-12-20 ENCOUNTER — Other Ambulatory Visit (INDEPENDENT_AMBULATORY_CARE_PROVIDER_SITE_OTHER): Payer: PPO

## 2014-12-20 ENCOUNTER — Telehealth: Payer: Self-pay | Admitting: *Deleted

## 2014-12-20 DIAGNOSIS — I10 Essential (primary) hypertension: Secondary | ICD-10-CM | POA: Diagnosis not present

## 2014-12-20 LAB — BASIC METABOLIC PANEL
BUN: 16 mg/dL (ref 6–23)
CHLORIDE: 106 meq/L (ref 96–112)
CO2: 31 mEq/L (ref 19–32)
Calcium: 9.5 mg/dL (ref 8.4–10.5)
Creatinine, Ser: 0.94 mg/dL (ref 0.40–1.20)
GFR: 61.65 mL/min (ref >60.00)
Glucose, Bld: 92 mg/dL (ref 70–99)
Potassium: 3.9 mEq/L (ref 3.5–5.1)
Sodium: 141 mEq/L (ref 135–145)

## 2014-12-20 NOTE — Telephone Encounter (Signed)
Noted. Keep us posted

## 2014-12-20 NOTE — Telephone Encounter (Signed)
Pt stopped me in the hall today after having labs drawn & wanted me to let Dr. Nicki Reaper know that things are moving quickly & she is having her pre-op done today & surgery has been scheduled for 75/16.

## 2014-12-21 ENCOUNTER — Telehealth: Payer: Self-pay | Admitting: Internal Medicine

## 2014-12-21 ENCOUNTER — Encounter: Payer: Self-pay | Admitting: *Deleted

## 2014-12-21 DIAGNOSIS — N814 Uterovaginal prolapse, unspecified: Secondary | ICD-10-CM | POA: Insufficient documentation

## 2014-12-21 NOTE — Telephone Encounter (Signed)
She will need to be seen.  Apparently needs pre op prior to her surgery.  See if she can come in Wednesday 12/22/14 - (I guess around 1:30 - if there is nurse coverage.  I will see her.  She may have to wait.  Let me know if any problems.

## 2014-12-21 NOTE — Telephone Encounter (Signed)
Patricia Pacheco with Dr. Buel Ream office to state that the pt is having a robotic hysterectomy  and Dr. Sharlett Iles [facility]) is requesting the patient have an EKG prior to surg on 12/28/14. Please advise is okay to scheduled for appt in office to get EKG done before 12/28/14.

## 2014-12-21 NOTE — Telephone Encounter (Signed)
Ok to schedule nurse visit for EKG?

## 2014-12-22 ENCOUNTER — Encounter: Payer: Self-pay | Admitting: Internal Medicine

## 2014-12-22 ENCOUNTER — Ambulatory Visit (INDEPENDENT_AMBULATORY_CARE_PROVIDER_SITE_OTHER): Payer: PPO | Admitting: Internal Medicine

## 2014-12-22 VITALS — BP 149/73 | HR 69 | Temp 98.1°F | Ht 61.0 in | Wt 128.2 lb

## 2014-12-22 DIAGNOSIS — I10 Essential (primary) hypertension: Secondary | ICD-10-CM | POA: Diagnosis not present

## 2014-12-22 DIAGNOSIS — Z01818 Encounter for other preprocedural examination: Secondary | ICD-10-CM

## 2014-12-22 DIAGNOSIS — J3489 Other specified disorders of nose and nasal sinuses: Secondary | ICD-10-CM | POA: Diagnosis not present

## 2014-12-22 DIAGNOSIS — Z01810 Encounter for preprocedural cardiovascular examination: Secondary | ICD-10-CM | POA: Diagnosis not present

## 2014-12-22 DIAGNOSIS — F419 Anxiety disorder, unspecified: Secondary | ICD-10-CM | POA: Diagnosis not present

## 2014-12-22 NOTE — Progress Notes (Signed)
Pre visit review using our clinic review tool, if applicable. No additional management support is needed unless otherwise documented below in the visit note. 

## 2014-12-22 NOTE — Telephone Encounter (Signed)
Spoke to patient, aware of appt today to see Dr. Nicki Reaper and EKG

## 2014-12-26 ENCOUNTER — Encounter: Payer: Self-pay | Admitting: Internal Medicine

## 2014-12-26 DIAGNOSIS — Z01818 Encounter for other preprocedural examination: Secondary | ICD-10-CM | POA: Insufficient documentation

## 2014-12-26 NOTE — Progress Notes (Signed)
Patient ID: Patricia Pacheco, female   DOB: 1940/08/23, 74 y.o.   MRN: [id number]   Subjective:    Patient ID: Patricia Pacheco, female    DOB: 1940-07-12, 74 y.o.   MRN: [id number]  HPI  Patient here for a pre op evaluation.  Saw Dr Sharlett Iles.  Is planning for surgery for the adnexal mass and prolapse.  See her note for details.  Had her hospital pre op yesterday.  Here for EKG.  Tries to stay active.  No cardiac symptoms with increased activity or exertion.  No sob.  No acid reflux.  Eating and drinking well.  Bowels stable.  No increased abdominal pain.  No diarrhea.     Past Medical History  Diagnosis Date  . Asthma   . Arthritis   . GERD (gastroesophageal reflux disease)     EGD (05/25/04) - slight presbyesophagus, mild esophagitis, mild gastritis, mild duodenitis - positive H. pylori.   . Allergy     allergy shots (Juengel)  . Neuropathy   . CAD (coronary artery disease)     cath 11/27/04 - moderated LAD disease    Current Outpatient Prescriptions on File Prior to Visit  Medication Sig Dispense Refill  . aspirin 81 MG tablet Take 81 mg by mouth daily.    . Calcium Carbonate-Vit D-Min (CALCIUM 1200 PO) Take by mouth daily.    . celecoxib (CELEBREX) 200 MG capsule Take 1 capsule (200 mg total) by mouth daily as needed. 30 capsule 1  . COD LIVER OIL PO Take by mouth.    . fexofenadine (ALLEGRA) 180 MG tablet Take 180 mg by mouth daily.    Marland Kitchen lisinopril (PRINIVIL,ZESTRIL) 10 MG tablet Take 1 tablet (10 mg total) by mouth daily. 30 tablet 1  . Multiple Vitamin (MULTIVITAMIN) tablet Take 1 tablet by mouth daily.    . mupirocin ointment (BACTROBAN) 2 % Place 1 application into the nose 2 (two) times daily. 22 g 0  . Naproxen Sodium (ALEVE PO) Take by mouth as needed.    . Omega-3 Fatty Acids (FISH OIL PO) Take by mouth daily.    Marland Kitchen omeprazole (PRILOSEC) 40 MG capsule Take 1 capsule (40 mg total) by mouth daily. 30 capsule 2  . pseudoephedrine (SUDAFED) 60 MG tablet Take 60 mg by mouth every 4  (four) hours as needed for congestion.    . sertraline (ZOLOFT) 50 MG tablet Take 1 tablet (50 mg total) by mouth daily. (Patient taking differently: Take 25 mg by mouth daily. ) 30 tablet 1   No current facility-administered medications on file prior to visit.    Review of Systems  Constitutional: Negative for appetite change and unexpected weight change.  HENT: Negative for congestion and sinus pressure.   Respiratory: Negative for cough, chest tightness and shortness of breath.   Cardiovascular: Negative for chest pain, palpitations and leg swelling.  Gastrointestinal: Negative for nausea, vomiting, abdominal pain and diarrhea.  Skin: Negative for color change and rash.  Neurological: Negative for dizziness, light-headedness and headaches.  Psychiatric/Behavioral: Negative for dysphoric mood and agitation.       Objective:    Physical Exam  Constitutional: She appears well-developed and well-nourished. No distress.  HENT:  Nose: Nose normal.  Mouth/Throat: Oropharynx is clear and moist.  Neck: Neck supple.  Cardiovascular: Normal rate and regular rhythm.   Pulmonary/Chest: Breath sounds normal. No respiratory distress. She has no wheezes.  Abdominal: Soft. Bowel sounds are normal. There is no tenderness.  Musculoskeletal: She exhibits no  edema or tenderness.  Lymphadenopathy:    She has no cervical adenopathy.  Skin: No rash noted. No erythema.  Psychiatric: She has a normal mood and affect. Her behavior is normal.    BP 149/73 mmHg  Pulse 69  Temp(Src) 98.1 F (36.7 C) (Oral)  Ht 5\' 1"  (1.549 m)  Wt 128 lb 4 oz (58.174 kg)  BMI 24.25 kg/m2  SpO2 100%  LMP 08/29/1995 Wt Readings from Last 3 Encounters:  12/22/14 128 lb 4 oz (58.174 kg)  12/06/14 129 lb 6.4 oz (58.695 kg)  11/01/14 130 lb 8 oz (59.194 kg)     Lab Results  Component Value Date   WBC 4.1 12/24/2013   HGB 12.1 12/24/2013   HCT 35.8* 12/24/2013   PLT 174.0 12/24/2013   GLUCOSE 92 12/20/2014    CHOL 193 10/29/2014   TRIG 120.0 10/29/2014   HDL 64.00 10/29/2014   LDLDIRECT 115.0 09/04/2012   LDLCALC 105* 10/29/2014   ALT 15 10/29/2014   AST 20 10/29/2014   NA 141 12/20/2014   K 3.9 12/20/2014   CL 106 12/20/2014   CREATININE 0.94 12/20/2014   BUN 16 12/20/2014   CO2 31 12/20/2014   TSH 2.06 12/24/2013    Mr Brain W Wo Contrast  11/11/2014   CLINICAL DATA:  Normal normal headache.  Right-sided hearing loss  EXAM: MRI HEAD WITHOUT AND WITH CONTRAST  TECHNIQUE: Multiplanar, multiecho pulse sequences of the brain and surrounding structures were obtained without and with intravenous contrast.  CONTRAST:  81mL MULTIHANCE GADOBENATE DIMEGLUMINE 529 MG/ML IV SOLN  COMPARISON:  CT head 04/27/2014  FINDINGS: IAC protocol was performed including thin section imaging through the posterior fossa before and after intravenous contrast.  Seventh and eighth cranial nerves are normal. Negative for vestibular schwannoma. No mass lesion. Mastoid sinus clear. No cause for hearing loss identified  Moderate atrophy. Chronic microvascular ischemic changes are present in the white matter.  Negative for acute infarct.  Pituitary normal in size.  Mild mucosal edema right maxillary sinus.  Negative for mass or edema. Normal enhancement postcontrast administration  IMPRESSION: Atrophy and chronic microvascular ischemia.  No acute abnormality.   Electronically Signed   By: Franchot Gallo M.D.   On: 11/11/2014 13:39   US Transvaginal Non-ob  11/11/2014   CLINICAL DATA:  Persistent pelvic and lower abdominal pain for the past 3 months; history of appendectomy  EXAM: TRANSABDOMINAL AND TRANSVAGINAL ULTRASOUND OF PELVIS  TECHNIQUE: Both transabdominal and transvaginal ultrasound examinations of the pelvis were performed. Transabdominal technique was performed for global imaging of the pelvis including uterus, ovaries, adnexal regions, and pelvic cul-de-sac. It was necessary to proceed with endovaginal exam following the  transabdominal exam to visualize the endometrium and adnexal structures.  COMPARISON:  None  FINDINGS: Uterus  Measurements: 6.0 x 3.5 x 4.4 cm. There are multiple hypoechoic structures containing calcifications in the uterine corpus. On the right 2 fibroids are visible with the largest measuring approximately 2.4 cm. On the left the largest measures 1.3 cm.  Endometrium  Thickness: 2.1 mm. The endometrium is mildly echogenic. No abnormal endometrial fluid collections are demonstrated.  Right ovary  The right ovary was not discretely demonstrated. However, there is a solid-appearing right adnexal mass measuring 3.8 x 2.8 x 3.8 cm. There is vascular flow internally and there are a few calcifications.  Left ovary  The left ovary could not be demonstrated. No left adnexal mass is demonstrated. There is no free pelvic fluid  Other findings  No free fluid.  IMPRESSION: 1. Multiple uterine fibroids. The endometrial stripe is not abnormally thickened. 2. Right adnexal mass without discrete demonstration of a normal ovary. This could reflect an enlarged right ovary or less likely be related to the adjacent uterus and reflected pedunculated fibroid. The left ovary could not be demonstrated but no mass was observed in the left adnexal region. Further evaluation with pelvic MRI would be useful if the patient can undergo the procedure.   Electronically Signed   By: David  Martinique M.D.   On: 11/11/2014 15:07   US Pelvis Complete  11/11/2014   CLINICAL DATA:  Persistent pelvic and lower abdominal pain for the past 3 months; history of appendectomy  EXAM: TRANSABDOMINAL AND TRANSVAGINAL ULTRASOUND OF PELVIS  TECHNIQUE: Both transabdominal and transvaginal ultrasound examinations of the pelvis were performed. Transabdominal technique was performed for global imaging of the pelvis including uterus, ovaries, adnexal regions, and pelvic cul-de-sac. It was necessary to proceed with endovaginal exam following the transabdominal exam  to visualize the endometrium and adnexal structures.  COMPARISON:  None  FINDINGS: Uterus  Measurements: 6.0 x 3.5 x 4.4 cm. There are multiple hypoechoic structures containing calcifications in the uterine corpus. On the right 2 fibroids are visible with the largest measuring approximately 2.4 cm. On the left the largest measures 1.3 cm.  Endometrium  Thickness: 2.1 mm. The endometrium is mildly echogenic. No abnormal endometrial fluid collections are demonstrated.  Right ovary  The right ovary was not discretely demonstrated. However, there is a solid-appearing right adnexal mass measuring 3.8 x 2.8 x 3.8 cm. There is vascular flow internally and there are a few calcifications.  Left ovary  The left ovary could not be demonstrated. No left adnexal mass is demonstrated. There is no free pelvic fluid  Other findings  No free fluid.  IMPRESSION: 1. Multiple uterine fibroids. The endometrial stripe is not abnormally thickened. 2. Right adnexal mass without discrete demonstration of a normal ovary. This could reflect an enlarged right ovary or less likely be related to the adjacent uterus and reflected pedunculated fibroid. The left ovary could not be demonstrated but no mass was observed in the left adnexal region. Further evaluation with pelvic MRI would be useful if the patient can undergo the procedure.   Electronically Signed   By: David  Martinique M.D.   On: 11/11/2014 15:07       Assessment & Plan:   Problem List Items Addressed This Visit    Anxiety    Increased anxiety as outlined.  Follow.  Will hold on any further intervention.  Follow.       Essential hypertension    Blood pressure as outlined.  Some increased stress related to the procedure, etc.  On lisinopril.  Hold on making any changes at this time.  Follow.  Will need close intra op and post op monitoring of her heart rate and blood pressure to avoid extremes.        Nasal lesion    Resolved.       Pre-op evaluation    EKG revealed SR  with no acute ischemic changes.  Given no chest pain, sob, etc and given EKG, I feel she is at low risk to proceed with the planned surgery.  She will need close intra op and post op monitoring of her heart rate and blood pressure to avoid extremes.         Other Visit Diagnoses    Preoperative cardiovascular examination    -  Primary    Relevant Orders    EKG 12-Lead (Completed)      I spent 25 minutes with the patient and more than 50% of the time was spent in consultation regarding the above.     Einar Pheasant, MD

## 2014-12-26 NOTE — Assessment & Plan Note (Signed)
Increased anxiety as outlined.  Follow.  Will hold on any further intervention.  Follow.

## 2014-12-26 NOTE — Assessment & Plan Note (Signed)
Blood pressure as outlined.  Some increased stress related to the procedure, etc.  On lisinopril.  Hold on making any changes at this time.  Follow.  Will need close intra op and post op monitoring of her heart rate and blood pressure to avoid extremes.

## 2014-12-26 NOTE — Assessment & Plan Note (Signed)
Resolved

## 2014-12-26 NOTE — Assessment & Plan Note (Signed)
EKG revealed SR with no acute ischemic changes.  Given no chest pain, sob, etc and given EKG, I feel she is at low risk to proceed with the planned surgery.  She will need close intra op and post op monitoring of her heart rate and blood pressure to avoid extremes.

## 2014-12-28 ENCOUNTER — Encounter: Payer: Self-pay | Admitting: *Deleted

## 2014-12-28 ENCOUNTER — Encounter: Payer: Self-pay | Admitting: Obstetrics and Gynecology

## 2014-12-28 NOTE — Progress Notes (Signed)
EKG faxed to Dr. Buel Ream office.

## 2014-12-29 ENCOUNTER — Encounter: Payer: Self-pay | Admitting: Obstetrics and Gynecology

## 2014-12-30 ENCOUNTER — Telehealth: Payer: Self-pay | Admitting: Internal Medicine

## 2014-12-30 NOTE — Telephone Encounter (Signed)
Thank you for the update.  Keep us posted.   

## 2014-12-30 NOTE — Telephone Encounter (Signed)
Pt wanted Dr. Nicki Reaper to know that she is good fine since her surgery. Pt is having some trouble moving around.msn

## 2015-01-07 ENCOUNTER — Other Ambulatory Visit: Payer: Self-pay | Admitting: Internal Medicine

## 2015-01-12 ENCOUNTER — Ambulatory Visit: Payer: PPO | Admitting: Internal Medicine

## 2015-01-26 ENCOUNTER — Encounter: Payer: Self-pay | Admitting: Internal Medicine

## 2015-01-26 ENCOUNTER — Ambulatory Visit (INDEPENDENT_AMBULATORY_CARE_PROVIDER_SITE_OTHER): Payer: PPO | Admitting: Internal Medicine

## 2015-01-26 VITALS — BP 118/76 | HR 73 | Temp 98.6°F | Ht 61.0 in | Wt 125.4 lb

## 2015-01-26 DIAGNOSIS — E7800 Pure hypercholesterolemia, unspecified: Secondary | ICD-10-CM

## 2015-01-26 DIAGNOSIS — K219 Gastro-esophageal reflux disease without esophagitis: Secondary | ICD-10-CM | POA: Diagnosis not present

## 2015-01-26 DIAGNOSIS — K5730 Diverticulosis of large intestine without perforation or abscess without bleeding: Secondary | ICD-10-CM | POA: Diagnosis not present

## 2015-01-26 DIAGNOSIS — I10 Essential (primary) hypertension: Secondary | ICD-10-CM

## 2015-01-26 DIAGNOSIS — F419 Anxiety disorder, unspecified: Secondary | ICD-10-CM

## 2015-01-26 DIAGNOSIS — R109 Unspecified abdominal pain: Secondary | ICD-10-CM

## 2015-01-26 DIAGNOSIS — Z0000 Encounter for general adult medical examination without abnormal findings: Secondary | ICD-10-CM

## 2015-01-26 MED ORDER — ALPRAZOLAM 0.25 MG PO TABS
0.2500 mg | ORAL_TABLET | Freq: Every day | ORAL | Status: DC | PRN
Start: 2015-01-26 — End: 2015-05-11

## 2015-01-26 NOTE — Progress Notes (Signed)
Patient ID: Patricia Pacheco, female   DOB: January 17, 1941, 74 y.o.   MRN: [id number]   Subjective:    Patient ID: Patricia Pacheco, female    DOB: Nov 20, 1940, 74 y.o.   MRN: [id number]  HPI  Patient here for a scheduled follow up.  She recently underwent supracervical hysterectomy with removal of tubes and ovaries and robot assisted colpopexy for vaginal suspension.  Has done well since her surgery.  No vaginal problems.  Taking something for her bowels.  Had good bowel movement today.  No significant abdominal pain.  Eating and drinking well.  No nausea or vomiting.  Tries to stay active.  No cardiac symptoms with increased activity or exertion.  No sob.  No acid reflux reported.     Past Medical History  Diagnosis Date  . Asthma   . Arthritis   . GERD (gastroesophageal reflux disease)     EGD (05/25/04) - slight presbyesophagus, mild esophagitis, mild gastritis, mild duodenitis - positive H. pylori.   . Allergy     allergy shots (Juengel)  . Neuropathy   . CAD (coronary artery disease)     cath 11/27/04 - moderated LAD disease    Outpatient Encounter Prescriptions as of 01/26/2015  Medication Sig  . aspirin 81 MG tablet Take 81 mg by mouth daily.  . Calcium Carbonate-Vit D-Min (CALCIUM 1200 PO) Take by mouth daily.  . celecoxib (CELEBREX) 200 MG capsule Take 1 capsule (200 mg total) by mouth daily as needed.  . COD LIVER OIL PO Take by mouth.  . docusate sodium (COLACE) 100 MG capsule Take by mouth.  . fexofenadine (ALLEGRA) 180 MG tablet Take 180 mg by mouth daily.  Marland Kitchen lisinopril (PRINIVIL,ZESTRIL) 10 MG tablet TAKE ONE TABLET BY MOUTH EVERY DAY  . Multiple Vitamin (MULTIVITAMIN) tablet Take 1 tablet by mouth daily.  . mupirocin ointment (BACTROBAN) 2 % Place 1 application into the nose 2 (two) times daily.  . Naproxen Sodium (ALEVE PO) Take by mouth as needed.  . Omega-3 Fatty Acids (FISH OIL PO) Take by mouth daily.  Marland Kitchen omeprazole (PRILOSEC) 40 MG capsule Take 1 capsule (40 mg total) by mouth  daily.  . pseudoephedrine (SUDAFED) 60 MG tablet Take 60 mg by mouth every 4 (four) hours as needed for congestion.  Marland Kitchen UNABLE TO FIND   . [DISCONTINUED] sertraline (ZOLOFT) 50 MG tablet TAKE ONE TABLET EVERY DAY  . ALPRAZolam (XANAX) 0.25 MG tablet Take 1 tablet (0.25 mg total) by mouth daily as needed for anxiety.   No facility-administered encounter medications on file as of 01/26/2015.    Review of Systems  Constitutional: Negative for appetite change and unexpected weight change.  HENT: Negative for congestion and sinus pressure.   Respiratory: Negative for cough, chest tightness and shortness of breath.   Cardiovascular: Negative for chest pain, palpitations and leg swelling.  Gastrointestinal: Negative for nausea, vomiting, abdominal pain and diarrhea.  Genitourinary: Negative for dysuria and difficulty urinating.  Skin: Negative for color change and rash.  Neurological: Negative for dizziness, light-headedness and headaches.  Psychiatric/Behavioral: Negative for behavioral problems and agitation.       Objective:    Physical Exam  Constitutional: She appears well-developed and well-nourished. No distress.  HENT:  Nose: Nose normal.  Mouth/Throat: Oropharynx is clear and moist.  Neck: Neck supple. No thyromegaly present.  Cardiovascular: Normal rate and regular rhythm.   Pulmonary/Chest: Breath sounds normal. No respiratory distress. She has no wheezes.  Abdominal: Soft. Bowel sounds are normal. There  is no tenderness.  Well healed incision sites.    Musculoskeletal: She exhibits no edema or tenderness.  Lymphadenopathy:    She has no cervical adenopathy.  Skin: No rash noted. No erythema.  Psychiatric: She has a normal mood and affect. Her behavior is normal.    BP 118/76 mmHg  Pulse 73  Temp(Src) 98.6 F (37 C) (Oral)  Ht 5\' 1"  (1.549 m)  Wt 125 lb 6 oz (56.87 kg)  BMI 23.70 kg/m2  SpO2 98%  LMP 08/29/1995 Wt Readings from Last 3 Encounters:  01/26/15 125 lb 6  oz (56.87 kg)  12/22/14 128 lb 4 oz (58.174 kg)  12/06/14 129 lb 6.4 oz (58.695 kg)     Lab Results  Component Value Date   WBC 4.1 12/24/2013   HGB 12.1 12/24/2013   HCT 35.8* 12/24/2013   PLT 174.0 12/24/2013   GLUCOSE 92 12/20/2014   CHOL 193 10/29/2014   TRIG 120.0 10/29/2014   HDL 64.00 10/29/2014   LDLDIRECT 115.0 09/04/2012   LDLCALC 105* 10/29/2014   ALT 15 10/29/2014   AST 20 10/29/2014   NA 141 12/20/2014   K 3.9 12/20/2014   CL 106 12/20/2014   CREATININE 0.94 12/20/2014   BUN 16 12/20/2014   CO2 31 12/20/2014   TSH 2.06 12/24/2013    Mr Brain W Wo Contrast  11/11/2014   CLINICAL DATA:  Normal normal headache.  Right-sided hearing loss  EXAM: MRI HEAD WITHOUT AND WITH CONTRAST  TECHNIQUE: Multiplanar, multiecho pulse sequences of the brain and surrounding structures were obtained without and with intravenous contrast.  CONTRAST:  85mL MULTIHANCE GADOBENATE DIMEGLUMINE 529 MG/ML IV SOLN  COMPARISON:  CT head 04/27/2014  FINDINGS: IAC protocol was performed including thin section imaging through the posterior fossa before and after intravenous contrast.  Seventh and eighth cranial nerves are normal. Negative for vestibular schwannoma. No mass lesion. Mastoid sinus clear. No cause for hearing loss identified  Moderate atrophy. Chronic microvascular ischemic changes are present in the white matter.  Negative for acute infarct.  Pituitary normal in size.  Mild mucosal edema right maxillary sinus.  Negative for mass or edema. Normal enhancement postcontrast administration  IMPRESSION: Atrophy and chronic microvascular ischemia.  No acute abnormality.   Electronically Signed   By: Franchot Gallo M.D.   On: 11/11/2014 13:39   US Transvaginal Non-ob  11/11/2014   CLINICAL DATA:  Persistent pelvic and lower abdominal pain for the past 3 months; history of appendectomy  EXAM: TRANSABDOMINAL AND TRANSVAGINAL ULTRASOUND OF PELVIS  TECHNIQUE: Both transabdominal and transvaginal  ultrasound examinations of the pelvis were performed. Transabdominal technique was performed for global imaging of the pelvis including uterus, ovaries, adnexal regions, and pelvic cul-de-sac. It was necessary to proceed with endovaginal exam following the transabdominal exam to visualize the endometrium and adnexal structures.  COMPARISON:  None  FINDINGS: Uterus  Measurements: 6.0 x 3.5 x 4.4 cm. There are multiple hypoechoic structures containing calcifications in the uterine corpus. On the right 2 fibroids are visible with the largest measuring approximately 2.4 cm. On the left the largest measures 1.3 cm.  Endometrium  Thickness: 2.1 mm. The endometrium is mildly echogenic. No abnormal endometrial fluid collections are demonstrated.  Right ovary  The right ovary was not discretely demonstrated. However, there is a solid-appearing right adnexal mass measuring 3.8 x 2.8 x 3.8 cm. There is vascular flow internally and there are a few calcifications.  Left ovary  The left ovary could not be demonstrated. No  left adnexal mass is demonstrated. There is no free pelvic fluid  Other findings  No free fluid.  IMPRESSION: 1. Multiple uterine fibroids. The endometrial stripe is not abnormally thickened. 2. Right adnexal mass without discrete demonstration of a normal ovary. This could reflect an enlarged right ovary or less likely be related to the adjacent uterus and reflected pedunculated fibroid. The left ovary could not be demonstrated but no mass was observed in the left adnexal region. Further evaluation with pelvic MRI would be useful if the patient can undergo the procedure.   Electronically Signed   By: David  Martinique M.D.   On: 11/11/2014 15:07   US Pelvis Complete  11/11/2014   CLINICAL DATA:  Persistent pelvic and lower abdominal pain for the past 3 months; history of appendectomy  EXAM: TRANSABDOMINAL AND TRANSVAGINAL ULTRASOUND OF PELVIS  TECHNIQUE: Both transabdominal and transvaginal ultrasound  examinations of the pelvis were performed. Transabdominal technique was performed for global imaging of the pelvis including uterus, ovaries, adnexal regions, and pelvic cul-de-sac. It was necessary to proceed with endovaginal exam following the transabdominal exam to visualize the endometrium and adnexal structures.  COMPARISON:  None  FINDINGS: Uterus  Measurements: 6.0 x 3.5 x 4.4 cm. There are multiple hypoechoic structures containing calcifications in the uterine corpus. On the right 2 fibroids are visible with the largest measuring approximately 2.4 cm. On the left the largest measures 1.3 cm.  Endometrium  Thickness: 2.1 mm. The endometrium is mildly echogenic. No abnormal endometrial fluid collections are demonstrated.  Right ovary  The right ovary was not discretely demonstrated. However, there is a solid-appearing right adnexal mass measuring 3.8 x 2.8 x 3.8 cm. There is vascular flow internally and there are a few calcifications.  Left ovary  The left ovary could not be demonstrated. No left adnexal mass is demonstrated. There is no free pelvic fluid  Other findings  No free fluid.  IMPRESSION: 1. Multiple uterine fibroids. The endometrial stripe is not abnormally thickened. 2. Right adnexal mass without discrete demonstration of a normal ovary. This could reflect an enlarged right ovary or less likely be related to the adjacent uterus and reflected pedunculated fibroid. The left ovary could not be demonstrated but no mass was observed in the left adnexal region. Further evaluation with pelvic MRI would be useful if the patient can undergo the procedure.   Electronically Signed   By: David  Martinique M.D.   On: 11/11/2014 15:07       Assessment & Plan:   Problem List Items Addressed This Visit    Abdominal pain    Was having abdominal pain and pelvic pain.  colonsocpy 10/08/13 - diverticulosis, otherwise normal.  S/p pelvic surgery as outlined.  Pain better.  Follow.        Anxiety    Doing  better.  Takes xanax prn.  Wasn't to come off sertraline.  Does not feel she needs now.  Follow.       Relevant Medications   ALPRAZolam (XANAX) 0.25 MG tablet   Diverticulosis    Colonoscopy 10/08/13 - as outlined.  Recommended f/u colonoscopy in five years.       Essential hypertension - Primary    Blood pressure under good control.  Continue same medication regimen.  Follow pressures.  Follow metabolic panel.        GERD (gastroesophageal reflux disease)    Controlled.       Relevant Medications   docusate sodium (COLACE) 100 MG capsule   Health  care maintenance    Physical 11/01/14.  Mammogram 11/27/13 - Birads I.  Colonoscopy 10/08/13 - sigmoid diverticulosis otherwise normal.   Needs mammogram.        Pure hypercholesterolemia    Low cholesterol diet and exercise.  Follow lipid panel.         I spent 25 minutes with the patient and more than 50% of the time was spent in consultation regarding the above.     Einar Pheasant, MD

## 2015-01-26 NOTE — Progress Notes (Signed)
Pre visit review using our clinic review tool, if applicable. No additional management support is needed unless otherwise documented below in the visit note. 

## 2015-01-30 ENCOUNTER — Encounter: Payer: Self-pay | Admitting: Internal Medicine

## 2015-01-30 NOTE — Assessment & Plan Note (Signed)
Was having abdominal pain and pelvic pain.  colonsocpy 10/08/13 - diverticulosis, otherwise normal.  S/p pelvic surgery as outlined.  Pain better.  Follow.

## 2015-01-30 NOTE — Assessment & Plan Note (Signed)
Doing better.  Takes xanax prn.  Wasn't to come off sertraline.  Does not feel she needs now.  Follow.

## 2015-01-30 NOTE — Assessment & Plan Note (Signed)
Colonoscopy 10/08/13 - as outlined.  Recommended f/u colonoscopy in five years.

## 2015-01-30 NOTE — Assessment & Plan Note (Signed)
Controlled.  

## 2015-01-30 NOTE — Assessment & Plan Note (Signed)
Blood pressure under good control.  Continue same medication regimen.  Follow pressures.  Follow metabolic panel.   

## 2015-01-30 NOTE — Assessment & Plan Note (Addendum)
Physical 11/01/14.  Mammogram 11/27/13 - Birads I.  Colonoscopy 10/08/13 - sigmoid diverticulosis otherwise normal.   Needs mammogram.

## 2015-01-30 NOTE — Assessment & Plan Note (Signed)
Low cholesterol diet and exercise.  Follow lipid panel.   

## 2015-03-31 ENCOUNTER — Encounter: Payer: Self-pay | Admitting: Internal Medicine

## 2015-03-31 ENCOUNTER — Ambulatory Visit (INDEPENDENT_AMBULATORY_CARE_PROVIDER_SITE_OTHER): Payer: PPO | Admitting: Internal Medicine

## 2015-03-31 VITALS — BP 120/70 | HR 78 | Temp 98.3°F | Resp 18 | Ht 61.0 in | Wt 127.0 lb

## 2015-03-31 DIAGNOSIS — Z1239 Encounter for other screening for malignant neoplasm of breast: Secondary | ICD-10-CM

## 2015-03-31 DIAGNOSIS — I10 Essential (primary) hypertension: Secondary | ICD-10-CM | POA: Diagnosis not present

## 2015-03-31 DIAGNOSIS — F419 Anxiety disorder, unspecified: Secondary | ICD-10-CM

## 2015-03-31 DIAGNOSIS — E7800 Pure hypercholesterolemia, unspecified: Secondary | ICD-10-CM

## 2015-03-31 DIAGNOSIS — K219 Gastro-esophageal reflux disease without esophagitis: Secondary | ICD-10-CM

## 2015-03-31 DIAGNOSIS — R21 Rash and other nonspecific skin eruption: Secondary | ICD-10-CM

## 2015-03-31 MED ORDER — FLUOXETINE HCL 10 MG PO CAPS: 10.0000 mg | ORAL_CAPSULE | Freq: Every day | ORAL | Status: DC

## 2015-03-31 MED ORDER — SILVER SULFADIAZINE 1 % EX CREA: 1.0000 "application " | TOPICAL_CREAM | Freq: Every day | CUTANEOUS | Status: DC

## 2015-03-31 NOTE — Progress Notes (Signed)
Pre-visit discussion using our clinic review tool. No additional management support is needed unless otherwise documented below in the visit note.  

## 2015-03-31 NOTE — Progress Notes (Signed)
Patient ID: Patricia Pacheco, female   DOB: 05-08-1941, 74 y.o.   MRN: [id number]   Subjective:    Patient ID: Patricia Pacheco, female    DOB: [birth year], 74 y.o.   MRN: [id number]  HPI  Patient with past history of hypertension, GERD, anxiety and previous bladder issues.  She comes in today to follow up on these issues.  She is s/p recent bladder surgery and supracervical hysterectomy.  Seeing Dr Sharlett Iles.  Appears to be doing well from a GU standpoint.  Tries to stay active.  No cardiac symptoms with increased activity or exertion.  No sob.  No upper symptoms reported.  She reports persistent rash (from sunburn) on anterior chest.  No abdominal pain or cramping.  Bowels stable.  Increased stress.  Feels needs something more.  Has tried zoloft.     Past Medical History  Diagnosis Date  . Asthma   . Arthritis   . GERD (gastroesophageal reflux disease)     EGD (05/25/04) - slight presbyesophagus, mild esophagitis, mild gastritis, mild duodenitis - positive H. pylori.   . Allergy     allergy shots ([place])  . Neuropathy ([place])   . CAD (coronary artery disease)     cath 11/27/04 - moderated LAD disease   Past Surgical History  Procedure Laterality Date  . Appendectomy  1967   Family History  Problem Relation Age of Onset  . Arthritis Mother   . Colon polyps Sister    Social History   Social History  . Marital Status: Married    Spouse Name: N/A  . Number of Children: N/A  . Years of Education: N/A   Social History Main Topics  . Smoking status: Never Smoker   . Smokeless tobacco: Never Used  . Alcohol Use: No  . Drug Use: No  . Sexual Activity: Not Asked   Other Topics Concern  . None   Social History Narrative    Outpatient Encounter Prescriptions as of 03/31/2015  Medication Sig  . ALPRAZolam (XANAX) 0.25 MG tablet Take 1 tablet (0.25 mg total) by mouth daily as needed for anxiety.  Marland Kitchen aspirin 81 MG tablet Take 81 mg by mouth daily.  . Calcium Carbonate-Vit D-Min (CALCIUM 1200  PO) Take by mouth daily.  . celecoxib (CELEBREX) 200 MG capsule Take 1 capsule (200 mg total) by mouth daily as needed.  . COD LIVER OIL PO Take by mouth.  . docusate sodium (COLACE) 100 MG capsule Take by mouth.  . fexofenadine (ALLEGRA) 180 MG tablet Take 180 mg by mouth daily.  Marland Kitchen lisinopril (PRINIVIL,ZESTRIL) 10 MG tablet TAKE ONE TABLET BY MOUTH EVERY DAY  . Multiple Vitamin (MULTIVITAMIN) tablet Take 1 tablet by mouth daily.  . mupirocin ointment (BACTROBAN) 2 % Place 1 application into the nose 2 (two) times daily.  . Naproxen Sodium (ALEVE PO) Take by mouth as needed.  . Omega-3 Fatty Acids (FISH OIL PO) Take by mouth daily.  Marland Kitchen omeprazole (PRILOSEC) 40 MG capsule Take 1 capsule (40 mg total) by mouth daily.  . pseudoephedrine (SUDAFED) 60 MG tablet Take 60 mg by mouth every 4 (four) hours as needed for congestion.  Marland Kitchen UNABLE TO FIND   . FLUoxetine (PROZAC) 10 MG capsule Take 1 capsule (10 mg total) by mouth daily.  . silver sulfADIAZINE (SILVADENE) 1 % cream Apply 1 application topically daily.   No facility-administered encounter medications on file as of 03/31/2015.    Review of Systems  Constitutional: Negative for appetite change and  unexpected weight change.  HENT: Negative for congestion and sinus pressure.   Eyes: Negative for pain and visual disturbance.  Respiratory: Negative for cough, chest tightness and shortness of breath.   Cardiovascular: Negative for chest pain, palpitations and leg swelling.  Gastrointestinal: Negative for nausea, vomiting, abdominal pain and diarrhea.  Genitourinary: Negative for dysuria and difficulty urinating.  Musculoskeletal: Negative for back pain and joint swelling.  Skin: Positive for rash (persistent anterior check rash. ).  Neurological: Negative for dizziness, light-headedness and headaches.  Psychiatric/Behavioral: Negative for dysphoric mood and agitation.       Objective:    Physical Exam  Constitutional: She appears  well-developed and well-nourished. No distress.  HENT:  Nose: Nose normal.  Mouth/Throat: Oropharynx is clear and moist.  Eyes: Conjunctivae are normal. Right eye exhibits no discharge. Left eye exhibits no discharge.  Neck: Neck supple. No thyromegaly present.  Cardiovascular: Normal rate and regular rhythm.   Pulmonary/Chest: Breath sounds normal. No respiratory distress. She has no wheezes.  Abdominal: Soft. Bowel sounds are normal. There is no tenderness.  Musculoskeletal: She exhibits no edema or tenderness.  Lymphadenopathy:    She has no cervical adenopathy.  Skin: Rash noted.  Erythematous based rash anterior chest.    Psychiatric: She has a normal mood and affect. Her behavior is normal.    BP 120/70 mmHg  Pulse 78  Temp(Src) 98.3 F (36.8 C) (Oral)  Resp 18  Ht 5\' 1"  (1.549 m)  Wt 127 lb (57.607 kg)  BMI 24.01 kg/m2  SpO2 99%  LMP 08/29/1995 Wt Readings from Last 3 Encounters:  03/31/15 127 lb (57.607 kg)  01/26/15 125 lb 6 oz (56.87 kg)  12/22/14 128 lb 4 oz (58.174 kg)     Lab Results  Component Value Date   WBC 4.1 12/24/2013   HGB 12.1 12/24/2013   HCT 35.8* 12/24/2013   PLT 174.0 12/24/2013   GLUCOSE 92 12/20/2014   CHOL 193 10/29/2014   TRIG 120.0 10/29/2014   HDL 64.00 10/29/2014   LDLDIRECT 115.0 09/04/2012   LDLCALC 105* 10/29/2014   ALT 15 10/29/2014   AST 20 10/29/2014   NA 141 12/20/2014   K 3.9 12/20/2014   CL 106 12/20/2014   CREATININE 0.94 12/20/2014   BUN 16 12/20/2014   CO2 31 12/20/2014   TSH 2.06 12/24/2013    Mr Brain W Wo Contrast  11/11/2014   CLINICAL DATA:  Normal normal headache.  Right-sided hearing loss  EXAM: MRI HEAD WITHOUT AND WITH CONTRAST  TECHNIQUE: Multiplanar, multiecho pulse sequences of the brain and surrounding structures were obtained without and with intravenous contrast.  CONTRAST:  13mL MULTIHANCE GADOBENATE DIMEGLUMINE 529 MG/ML IV SOLN  COMPARISON:  CT head 04/27/2014  FINDINGS: IAC protocol was  performed including thin section imaging through the posterior fossa before and after intravenous contrast.  Seventh and eighth cranial nerves are normal. Negative for vestibular schwannoma. No mass lesion. Mastoid sinus clear. No cause for hearing loss identified  Moderate atrophy. Chronic microvascular ischemic changes are present in the white matter.  Negative for acute infarct.  Pituitary normal in size.  Mild mucosal edema right maxillary sinus.  Negative for mass or edema. Normal enhancement postcontrast administration  IMPRESSION: Atrophy and chronic microvascular ischemia.  No acute abnormality.   Electronically Signed   By: Franchot Gallo M.D.   On: 11/11/2014 13:39   US Transvaginal Non-ob  11/11/2014   CLINICAL DATA:  Persistent pelvic and lower abdominal pain for the past 3 months;  history of appendectomy  EXAM: TRANSABDOMINAL AND TRANSVAGINAL ULTRASOUND OF PELVIS  TECHNIQUE: Both transabdominal and transvaginal ultrasound examinations of the pelvis were performed. Transabdominal technique was performed for global imaging of the pelvis including uterus, ovaries, adnexal regions, and pelvic cul-de-sac. It was necessary to proceed with endovaginal exam following the transabdominal exam to visualize the endometrium and adnexal structures.  COMPARISON:  None  FINDINGS: Uterus  Measurements: 6.0 x 3.5 x 4.4 cm. There are multiple hypoechoic structures containing calcifications in the uterine corpus. On the right 2 fibroids are visible with the largest measuring approximately 2.4 cm. On the left the largest measures 1.3 cm.  Endometrium  Thickness: 2.1 mm. The endometrium is mildly echogenic. No abnormal endometrial fluid collections are demonstrated.  Right ovary  The right ovary was not discretely demonstrated. However, there is a solid-appearing right adnexal mass measuring 3.8 x 2.8 x 3.8 cm. There is vascular flow internally and there are a few calcifications.  Left ovary  The left ovary could not be  demonstrated. No left adnexal mass is demonstrated. There is no free pelvic fluid  Other findings  No free fluid.  IMPRESSION: 1. Multiple uterine fibroids. The endometrial stripe is not abnormally thickened. 2. Right adnexal mass without discrete demonstration of a normal ovary. This could reflect an enlarged right ovary or less likely be related to the adjacent uterus and reflected pedunculated fibroid. The left ovary could not be demonstrated but no mass was observed in the left adnexal region. Further evaluation with pelvic MRI would be useful if the patient can undergo the procedure.   Electronically Signed   By: David  Martinique M.D.   On: 11/11/2014 15:07   US Pelvis Complete  11/11/2014   CLINICAL DATA:  Persistent pelvic and lower abdominal pain for the past 3 months; history of appendectomy  EXAM: TRANSABDOMINAL AND TRANSVAGINAL ULTRASOUND OF PELVIS  TECHNIQUE: Both transabdominal and transvaginal ultrasound examinations of the pelvis were performed. Transabdominal technique was performed for global imaging of the pelvis including uterus, ovaries, adnexal regions, and pelvic cul-de-sac. It was necessary to proceed with endovaginal exam following the transabdominal exam to visualize the endometrium and adnexal structures.  COMPARISON:  None  FINDINGS: Uterus  Measurements: 6.0 x 3.5 x 4.4 cm. There are multiple hypoechoic structures containing calcifications in the uterine corpus. On the right 2 fibroids are visible with the largest measuring approximately 2.4 cm. On the left the largest measures 1.3 cm.  Endometrium  Thickness: 2.1 mm. The endometrium is mildly echogenic. No abnormal endometrial fluid collections are demonstrated.  Right ovary  The right ovary was not discretely demonstrated. However, there is a solid-appearing right adnexal mass measuring 3.8 x 2.8 x 3.8 cm. There is vascular flow internally and there are a few calcifications.  Left ovary  The left ovary could not be demonstrated. No left  adnexal mass is demonstrated. There is no free pelvic fluid  Other findings  No free fluid.  IMPRESSION: 1. Multiple uterine fibroids. The endometrial stripe is not abnormally thickened. 2. Right adnexal mass without discrete demonstration of a normal ovary. This could reflect an enlarged right ovary or less likely be related to the adjacent uterus and reflected pedunculated fibroid. The left ovary could not be demonstrated but no mass was observed in the left adnexal region. Further evaluation with pelvic MRI would be useful if the patient can undergo the procedure.   Electronically Signed   By: David  Martinique M.D.   On: 11/11/2014 15:07  Assessment & Plan:   Problem List Items Addressed This Visit    Anxiety    Increased stress.  Discussed with her today.  Tried zoloft.  Wants to try a different medication.  Start prozac 10mg  q day.  Follow closely.  Get her back in soon to reassess.       Relevant Medications   FLUoxetine (PROZAC) 10 MG capsule   Essential hypertension    Blood pressure has been under good control.  Continue same medication regimen.  Follow pressures.  Follow metabolic panel.        GERD (gastroesophageal reflux disease)    Upper symptoms controlled.  Follow.       Pure hypercholesterolemia    Low cholesterol diet and exercise.  Follow lipid panel.        Rash    Trial of silvadene cream.  Follow.         Other Visit Diagnoses    Breast cancer screening    -  Primary    Relevant Orders    MM DIGITAL SCREENING BILATERAL        Einar Pheasant, MD

## 2015-04-03 ENCOUNTER — Encounter: Payer: Self-pay | Admitting: Internal Medicine

## 2015-04-03 DIAGNOSIS — R21 Rash and other nonspecific skin eruption: Secondary | ICD-10-CM | POA: Insufficient documentation

## 2015-04-03 NOTE — Assessment & Plan Note (Signed)
Trial of silvadene cream.  Follow.

## 2015-04-03 NOTE — Assessment & Plan Note (Signed)
Increased stress.  Discussed with her today.  Tried zoloft.  Wants to try a different medication.  Start prozac 10mg  q day.  Follow closely.  Get her back in soon to reassess.

## 2015-04-03 NOTE — Assessment & Plan Note (Signed)
Blood pressure has been under good control.  Continue same medication regimen.  Follow pressures.  Follow metabolic panel.   

## 2015-04-03 NOTE — Assessment & Plan Note (Signed)
Upper symptoms controlled.  Follow.  

## 2015-04-03 NOTE — Assessment & Plan Note (Signed)
Low cholesterol diet and exercise.  Follow lipid panel.   

## 2015-04-04 ENCOUNTER — Ambulatory Visit: Payer: PPO | Admitting: Internal Medicine

## 2015-04-13 ENCOUNTER — Telehealth: Payer: Self-pay | Admitting: *Deleted

## 2015-04-13 NOTE — Telephone Encounter (Signed)
Called patient about her mammogram . She stated that the [facility]) would not take her insurance so need the number for [facility] [place] left number [phone] home line.

## 2015-05-09 ENCOUNTER — Ambulatory Visit
Admission: RE | Admit: 2015-05-09 | Discharge: 2015-05-09 | Disposition: A | Discharge status: Home / Self Care | Payer: PPO | Source: Ambulatory Visit | Attending: Internal Medicine | Admitting: Internal Medicine

## 2015-05-09 DIAGNOSIS — Z1239 Encounter for other screening for malignant neoplasm of breast: Secondary | ICD-10-CM

## 2015-05-09 DIAGNOSIS — Z1231 Encounter for screening mammogram for malignant neoplasm of breast: Secondary | ICD-10-CM | POA: Diagnosis present

## 2015-05-11 ENCOUNTER — Encounter: Payer: Self-pay | Admitting: Internal Medicine

## 2015-05-11 ENCOUNTER — Ambulatory Visit (INDEPENDENT_AMBULATORY_CARE_PROVIDER_SITE_OTHER): Payer: PPO | Admitting: Internal Medicine

## 2015-05-11 VITALS — BP 138/80 | HR 72 | Temp 98.4°F | Resp 18 | Ht 61.0 in | Wt 125.0 lb

## 2015-05-11 DIAGNOSIS — F419 Anxiety disorder, unspecified: Secondary | ICD-10-CM

## 2015-05-11 DIAGNOSIS — E7800 Pure hypercholesterolemia, unspecified: Secondary | ICD-10-CM | POA: Diagnosis not present

## 2015-05-11 DIAGNOSIS — M8580 Other specified disorders of bone density and structure, unspecified site: Secondary | ICD-10-CM

## 2015-05-11 DIAGNOSIS — I10 Essential (primary) hypertension: Secondary | ICD-10-CM

## 2015-05-11 DIAGNOSIS — K219 Gastro-esophageal reflux disease without esophagitis: Secondary | ICD-10-CM

## 2015-05-11 DIAGNOSIS — Z23 Encounter for immunization: Secondary | ICD-10-CM

## 2015-05-11 DIAGNOSIS — Z91048 Other nonmedicinal substance allergy status: Secondary | ICD-10-CM

## 2015-05-11 DIAGNOSIS — Z9109 Other allergy status, other than to drugs and biological substances: Secondary | ICD-10-CM

## 2015-05-11 MED ORDER — LISINOPRIL 10 MG PO TABS: 20.0000 mg | ORAL_TABLET | Freq: Every day | ORAL | Status: DC

## 2015-05-11 NOTE — Patient Instructions (Addendum)
Pneumococcal Vaccine, Polyvalent suspension for injection What is this medicine? PNEUMOCOCCAL VACCINE (NEU mo KOK al vak SEEN) is a vaccine used to prevent pneumococcus bacterial infections. These bacteria can cause serious infections like pneumonia, meningitis, and blood infections. This vaccine will lower your chance of getting pneumonia. If you do get pneumonia, it can make your symptoms milder and your illness shorter. This vaccine will not treat an infection and will not cause infection. This vaccine is recommended for infants and young children, adults with certain medical conditions, and adults 65 years or older. This medicine may be used for other purposes; ask your health care provider or pharmacist if you have questions. What should I tell my health care provider before I take this medicine? They need to know if you have any of these conditions: -bleeding problems -fever -immune system problems -an unusual or allergic reaction to pneumococcal vaccine, diphtheria toxoid, other vaccines, latex, other medicines, foods, dyes, or preservatives -pregnant or trying to get pregnant -breast-feeding How should I use this medicine? This vaccine is for injection into a muscle. It is given by a health care professional. A copy of Vaccine Information Statements will be given before each vaccination. Read this sheet carefully each time. The sheet may change frequently. Talk to your pediatrician regarding the use of this medicine in children. While this drug may be prescribed for children as young as 71 weeks old for selected conditions, precautions do apply. Overdosage: If you think you have taken too much of this medicine contact a poison control center or emergency room at once. NOTE: This medicine is only for you. Do not share this medicine with others. What if I miss a dose? It is important not to miss your dose. Call your doctor or health care professional if you are unable to keep an  appointment. What may interact with this medicine? -medicines for cancer chemotherapy -medicines that suppress your immune function -steroid medicines like prednisone or cortisone This list may not describe all possible interactions. Give your health care provider a list of all the medicines, herbs, non-prescription drugs, or dietary supplements you use. Also tell them if you smoke, drink alcohol, or use illegal drugs. Some items may interact with your medicine. What should I watch for while using this medicine? Mild fever and pain should go away in 3 days or less. Report any unusual symptoms to your doctor or health care professional. What side effects may I notice from receiving this medicine? Side effects that you should report to your doctor or health care professional as soon as possible: -allergic reactions like skin rash, itching or hives, swelling of the face, lips, or tongue -breathing problems -confused -fast or irregular heartbeat -fever over 102 degrees F -seizures -unusual bleeding or bruising -unusual muscle weakness Side effects that usually do not require medical attention (report to your doctor or health care professional if they continue or are bothersome): -aches and pains -diarrhea -fever of 102 degrees F or less -headache -irritable -loss of appetite -pain, tender at site where injected -trouble sleeping This list may not describe all possible side effects. Call your doctor for medical advice about side effects. You may report side effects to FDA at 1-800-FDA-1088. Where should I keep my medicine? This does not apply. This vaccine is given in a clinic, pharmacy, doctor's office, or other health care setting and will not be stored at home. NOTE: This sheet is a summary. It may not cover all possible information. If you have questions about this  medicine, talk to your doctor, pharmacist, or health care provider.    2016, Elsevier/Gold Standard. (2014-03-18  10:27:27) Influenza Virus Vaccine injection (Fluarix) What is this medicine? INFLUENZA VIRUS VACCINE (in floo EN zuh VAHY ruhs vak SEEN) helps to reduce the risk of getting influenza also known as the flu. This medicine may be used for other purposes; ask your health care provider or pharmacist if you have questions. What should I tell my health care provider before I take this medicine? They need to know if you have any of these conditions: -bleeding disorder like hemophilia -fever or infection -Guillain-Barre syndrome or other neurological problems -immune system problems -infection with the human immunodeficiency virus (HIV) or AIDS -low blood platelet counts -multiple sclerosis -an unusual or allergic reaction to influenza virus vaccine, eggs, chicken proteins, latex, gentamicin, other medicines, foods, dyes or preservatives -pregnant or trying to get pregnant -breast-feeding How should I use this medicine? This vaccine is for injection into a muscle. It is given by a health care professional. A copy of Vaccine Information Statements will be given before each vaccination. Read this sheet carefully each time. The sheet may change frequently. Talk to your pediatrician regarding the use of this medicine in children. Special care may be needed. Overdosage: If you think you have taken too much of this medicine contact a poison control center or emergency room at once. NOTE: This medicine is only for you. Do not share this medicine with others. What if I miss a dose? This does not apply. What may interact with this medicine? -chemotherapy or radiation therapy -medicines that lower your immune system like etanercept, anakinra, infliximab, and adalimumab -medicines that treat or prevent blood clots like warfarin -phenytoin -steroid medicines like prednisone or cortisone -theophylline -vaccines This list may not describe all possible interactions. Give your health care provider a list of  all the medicines, herbs, non-prescription drugs, or dietary supplements you use. Also tell them if you smoke, drink alcohol, or use illegal drugs. Some items may interact with your medicine. What should I watch for while using this medicine? Report any side effects that do not go away within 3 days to your doctor or health care professional. Call your health care provider if any unusual symptoms occur within 6 weeks of receiving this vaccine. You may still catch the flu, but the illness is not usually as bad. You cannot get the flu from the vaccine. The vaccine will not protect against colds or other illnesses that may cause fever. The vaccine is needed every year. What side effects may I notice from receiving this medicine? Side effects that you should report to your doctor or health care professional as soon as possible: -allergic reactions like skin rash, itching or hives, swelling of the face, lips, or tongue Side effects that usually do not require medical attention (report to your doctor or health care professional if they continue or are bothersome): -fever -headache -muscle aches and pains -pain, tenderness, redness, or swelling at site where injected -weak or tired This list may not describe all possible side effects. Call your doctor for medical advice about side effects. You may report side effects to FDA at 1-800-FDA-1088. Where should I keep my medicine? This vaccine is only given in a clinic, pharmacy, doctor's office, or other health care setting and will not be stored at home. NOTE: This sheet is a summary. It may not cover all possible information. If you have questions about this medicine, talk to your doctor, pharmacist, or  health care provider.    2016, Elsevier/Gold Standard. (2008-01-07 09:30:40)

## 2015-05-11 NOTE — Progress Notes (Signed)
Patient ID: Patricia Pacheco, female   DOB: Apr 05, 1941, 74 y.o.   MRN: [id number]   Subjective:    Patient ID: Patricia Pacheco, female    DOB: 1940/10/12, 74 y.o.   MRN: [id number]  HPI  Patient with past history of GERD, allergies, CAD, documented arthritis and neuropathy.  She comes in today to follow up on these issues.  She has had a supracervical hysterectomy with removal of tubes and ovaries.  Had f/u 04/14/15.  Felt doing well.  Recommended f/u in one year.  Breathing stable.  Tries to stay active.  No cardiac symptoms with increased activity or exertion.  Does report some burning in her stomach.  No other abdominal pain or cramping.  Bowels stable.  On prozac.  Feels this is working well.  Feels better since starting.     Past Medical History  Diagnosis Date  . Asthma   . Arthritis   . GERD (gastroesophageal reflux disease)     EGD (05/25/04) - slight presbyesophagus, mild esophagitis, mild gastritis, mild duodenitis - positive H. pylori.   . Allergy     allergy shots (Juengel)  . Neuropathy ([place])   . CAD (coronary artery disease)     cath 11/27/04 - moderated LAD disease   Past Surgical History  Procedure Laterality Date  . Appendectomy  1967   Family History  Problem Relation Age of Onset  . Arthritis Mother   . Colon polyps Sister    Social History   Social History  . Marital Status: Married    Spouse Name: N/A  . Number of Children: N/A  . Years of Education: N/A   Social History Main Topics  . Smoking status: Never Smoker   . Smokeless tobacco: Never Used  . Alcohol Use: No  . Drug Use: No  . Sexual Activity: Not Asked   Other Topics Concern  . None   Social History Narrative    Outpatient Encounter Prescriptions as of 05/11/2015  Medication Sig  . aspirin 81 MG tablet Take 81 mg by mouth daily.  . Calcium Carbonate-Vit D-Min (CALCIUM 1200 PO) Take by mouth daily.  . celecoxib (CELEBREX) 200 MG capsule Take 1 capsule (200 mg total) by mouth daily as needed.    . COD LIVER OIL PO Take by mouth.  . docusate sodium (COLACE) 100 MG capsule Take by mouth.  . fexofenadine (ALLEGRA) 180 MG tablet Take 180 mg by mouth daily.  Marland Kitchen FLUoxetine (PROZAC) 10 MG capsule Take 1 capsule (10 mg total) by mouth daily.  Marland Kitchen lisinopril (PRINIVIL,ZESTRIL) 10 MG tablet Take 2 tablets (20 mg total) by mouth daily.  . Multiple Vitamin (MULTIVITAMIN) tablet Take 1 tablet by mouth daily.  . mupirocin ointment (BACTROBAN) 2 % Place 1 application into the nose 2 (two) times daily.  . Naproxen Sodium (ALEVE PO) Take by mouth as needed.  . Omega-3 Fatty Acids (FISH OIL PO) Take by mouth daily.  Marland Kitchen omeprazole (PRILOSEC) 40 MG capsule Take 1 capsule (40 mg total) by mouth daily.  . pseudoephedrine (SUDAFED) 60 MG tablet Take 60 mg by mouth every 4 (four) hours as needed for congestion.  . silver sulfADIAZINE (SILVADENE) 1 % cream Apply 1 application topically daily.  Marland Kitchen UNABLE TO FIND   . [DISCONTINUED] ALPRAZolam (XANAX) 0.25 MG tablet Take 1 tablet (0.25 mg total) by mouth daily as needed for anxiety.  . [DISCONTINUED] lisinopril (PRINIVIL,ZESTRIL) 10 MG tablet TAKE ONE TABLET BY MOUTH EVERY DAY   No facility-administered encounter medications  on file as of 05/11/2015.    Review of Systems  Constitutional: Negative for appetite change and unexpected weight change.  HENT: Negative for congestion and sinus pressure.   Eyes: Negative for pain and discharge.  Respiratory: Negative for cough, chest tightness and shortness of breath.   Cardiovascular: Negative for chest pain, palpitations and leg swelling.  Gastrointestinal: Negative for nausea, vomiting, abdominal pain and diarrhea.       Some burning in her stomach.    Genitourinary: Negative for dysuria and difficulty urinating.  Musculoskeletal: Negative for back pain and joint swelling.  Skin: Negative for color change and rash.  Neurological: Negative for dizziness, light-headedness and headaches.  Psychiatric/Behavioral:  Negative for dysphoric mood and agitation.       Feels better on prozac.        Objective:    Physical Exam  Constitutional: She appears well-developed and well-nourished. No distress.  HENT:  Nose: Nose normal.  Mouth/Throat: Oropharynx is clear and moist.  Eyes: Conjunctivae are normal. Right eye exhibits no discharge. Left eye exhibits no discharge.  Neck: Neck supple. No thyromegaly present.  Cardiovascular: Normal rate and regular rhythm.   Pulmonary/Chest: Breath sounds normal. No respiratory distress. She has no wheezes.  Abdominal: Soft. Bowel sounds are normal. There is no tenderness.  Musculoskeletal: She exhibits no edema or tenderness.  Lymphadenopathy:    She has no cervical adenopathy.  Skin: No rash noted. No erythema.  Psychiatric: She has a normal mood and affect. Her behavior is normal.    BP 138/80 mmHg  Pulse 72  Temp(Src) 98.4 F (36.9 C) (Oral)  Resp 18  Ht 5\' 1"  (1.549 m)  Wt 125 lb (56.7 kg)  BMI 23.63 kg/m2  SpO2 98%  LMP 08/29/1995 Wt Readings from Last 3 Encounters:  05/11/15 125 lb (56.7 kg)  03/31/15 127 lb (57.607 kg)  01/26/15 125 lb 6 oz (56.87 kg)     Lab Results  Component Value Date   WBC 4.1 12/24/2013   HGB 12.1 12/24/2013   HCT 35.8* 12/24/2013   PLT 174.0 12/24/2013   GLUCOSE 92 12/20/2014   CHOL 193 10/29/2014   TRIG 120.0 10/29/2014   HDL 64.00 10/29/2014   LDLDIRECT 115.0 09/04/2012   LDLCALC 105* 10/29/2014   ALT 15 10/29/2014   AST 20 10/29/2014   NA 141 12/20/2014   K 3.9 12/20/2014   CL 106 12/20/2014   CREATININE 0.94 12/20/2014   BUN 16 12/20/2014   CO2 31 12/20/2014   TSH 2.06 12/24/2013    Mm Digital Screening Bilateral  05/10/2015  CLINICAL DATA:  Screening. EXAM: DIGITAL SCREENING BILATERAL MAMMOGRAM WITH CAD COMPARISON:  None. ACR Breast Density Category b: There are scattered areas of fibroglandular density. FINDINGS: There are no findings suspicious for malignancy. Images were processed with CAD.  IMPRESSION: No mammographic evidence of malignancy. A result letter of this screening mammogram will be mailed directly to the patient. RECOMMENDATION: Screening mammogram in one year. (Code:SM-B-01Y) BI-RADS CATEGORY  1: Negative. Electronically Signed   By: Lajean Manes M.D.   On: 05/10/2015 08:31       Assessment & Plan:   Problem List Items Addressed This Visit    Anxiety    Increased stress.  Doing better on prozac.  Feels this dose is working.  Follow.        Relevant Orders   TSH   Environmental allergies    Followed by Dr Kathyrn Sheriff.  Stopped her allergy injections.  Continue same medication regimen.  Follow.        Essential hypertension    Blood pressure has been under good control.  Continue same medication regimen.  Follow pressures.  Follow metabolic panel.  Was borderline on my check.  Follow.        Relevant Medications   lisinopril (PRINIVIL,ZESTRIL) 10 MG tablet   Other Relevant Orders   CBC with Differential/Platelet   Basic metabolic panel   GERD (gastroesophageal reflux disease)    Some occasional burning in her stomach.  On omeprazole.  Take regularly.  Discussed further w/up.  She will notify me if persistent issue.  Desires no further intervention at this time.        Osteopenia   Relevant Orders   VITAMIN D 25 Hydroxy (Vit-D Deficiency, Fractures)   Pure hypercholesterolemia    Low cholesterol diet and exercise.  Follow lipid panel.        Relevant Medications   lisinopril (PRINIVIL,ZESTRIL) 10 MG tablet   Other Relevant Orders   Lipid panel   Hepatic function panel    Other Visit Diagnoses    Need for prophylactic vaccination against Streptococcus pneumoniae (pneumococcus)    -  Primary    Relevant Orders    Pneumococcal conjugate vaccine 13-valent (Completed)    Encounter for immunization            Einar Pheasant, MD

## 2015-05-11 NOTE — Progress Notes (Signed)
Pre-visit discussion using our clinic review tool. No additional management support is needed unless otherwise documented below in the visit note.  

## 2015-05-21 ENCOUNTER — Encounter: Payer: Self-pay | Admitting: Internal Medicine

## 2015-05-21 NOTE — Assessment & Plan Note (Signed)
Blood pressure has been under good control.  Continue same medication regimen.  Follow pressures.  Follow metabolic panel.  Was borderline on my check.  Follow.

## 2015-05-21 NOTE — Assessment & Plan Note (Signed)
Followed by Dr Kathyrn Sheriff.  Stopped her allergy injections.  Continue same medication regimen.  Follow.

## 2015-05-21 NOTE — Assessment & Plan Note (Signed)
Low cholesterol diet and exercise.  Follow lipid panel.   

## 2015-05-21 NOTE — Assessment & Plan Note (Signed)
Increased stress.  Doing better on prozac.  Feels this dose is working.  Follow.

## 2015-05-21 NOTE — Assessment & Plan Note (Signed)
Some occasional burning in her stomach.  On omeprazole.  Take regularly.  Discussed further w/up.  She will notify me if persistent issue.  Desires no further intervention at this time.

## 2015-05-24 ENCOUNTER — Other Ambulatory Visit: Payer: Self-pay

## 2015-05-24 MED ORDER — LISINOPRIL 10 MG PO TABS: 20.0000 mg | ORAL_TABLET | Freq: Every day | ORAL | Status: DC

## 2015-05-25 ENCOUNTER — Other Ambulatory Visit (INDEPENDENT_AMBULATORY_CARE_PROVIDER_SITE_OTHER): Payer: PPO

## 2015-05-25 DIAGNOSIS — M8580 Other specified disorders of bone density and structure, unspecified site: Secondary | ICD-10-CM

## 2015-05-25 DIAGNOSIS — E7800 Pure hypercholesterolemia, unspecified: Secondary | ICD-10-CM | POA: Diagnosis not present

## 2015-05-25 DIAGNOSIS — F419 Anxiety disorder, unspecified: Secondary | ICD-10-CM | POA: Diagnosis not present

## 2015-05-25 DIAGNOSIS — I10 Essential (primary) hypertension: Secondary | ICD-10-CM

## 2015-05-25 LAB — CBC WITH DIFFERENTIAL/PLATELET
BASOS ABS: 0 10*3/uL (ref 0.0–0.1)
BASOS PCT: 0.5 % (ref 0.0–3.0)
EOS ABS: 0.2 10*3/uL (ref 0.0–0.7)
Eosinophils Relative: 3.9 % (ref 0.0–5.0)
HEMATOCRIT: 36.4 % (ref 36.0–46.0)
HEMOGLOBIN: 11.8 g/dL — AB (ref 12.0–15.0)
LYMPHS PCT: 39.7 % (ref 12.0–46.0)
Lymphs Abs: 1.9 10*3/uL (ref 0.7–4.0)
MCHC: 32.5 g/dL (ref 30.0–36.0)
MCV: 92 fl (ref 78.0–100.0)
Monocytes Absolute: 0.3 10*3/uL (ref 0.1–1.0)
Monocytes Relative: 6.7 % (ref 3.0–12.0)
Neutro Abs: 2.4 10*3/uL (ref 1.4–7.7)
Neutrophils Relative %: 49.2 % (ref 43.0–77.0)
Platelets: 192 10*3/uL (ref 150.0–400.0)
RBC: 3.96 Mil/uL (ref 3.87–5.11)
RDW: 13.1 % (ref 11.5–15.5)
WBC: 4.8 10*3/uL (ref 4.0–10.5)

## 2015-05-25 LAB — HEPATIC FUNCTION PANEL
ALT: 14 U/L (ref 0–35)
AST: 17 U/L (ref 0–37)
Albumin: 4 g/dL (ref 3.5–5.2)
Alkaline Phosphatase: 71 U/L (ref 39–117)
Bilirubin, Direct: 0.1 mg/dL (ref 0.0–0.3)
TOTAL PROTEIN: 6.6 g/dL (ref 6.0–8.3)
Total Bilirubin: 0.4 mg/dL (ref 0.2–1.2)

## 2015-05-25 LAB — LIPID PANEL
Cholesterol: 206 mg/dL — ABNORMAL HIGH (ref 0–200)
HDL: 58.5 mg/dL (ref >39.00)
LDL Cholesterol: 132 mg/dL — ABNORMAL HIGH (ref 0–99)
NonHDL: 147.31
TRIGLYCERIDES: 75 mg/dL (ref 0.0–149.0)
Total CHOL/HDL Ratio: 4
VLDL: 15 mg/dL (ref 0.0–40.0)

## 2015-05-25 LAB — BASIC METABOLIC PANEL
BUN: 15 mg/dL (ref 6–23)
CO2: 32 mEq/L (ref 19–32)
Calcium: 9.2 mg/dL (ref 8.4–10.5)
Chloride: 104 mEq/L (ref 96–112)
Creatinine, Ser: 0.94 mg/dL (ref 0.40–1.20)
GFR: 61.58 mL/min (ref >60.00)
GLUCOSE: 80 mg/dL (ref 70–99)
POTASSIUM: 4.1 meq/L (ref 3.5–5.1)
Sodium: 141 mEq/L (ref 135–145)

## 2015-05-25 LAB — TSH: TSH: 1.16 u[IU]/mL (ref 0.35–4.50)

## 2015-05-25 LAB — VITAMIN D 25 HYDROXY (VIT D DEFICIENCY, FRACTURES): VITD: 16.19 ng/mL — ABNORMAL LOW (ref 30.00–100.00)

## 2015-05-26 ENCOUNTER — Other Ambulatory Visit: Payer: Self-pay | Admitting: *Deleted

## 2015-05-26 ENCOUNTER — Other Ambulatory Visit: Payer: Self-pay | Admitting: Internal Medicine

## 2015-05-26 DIAGNOSIS — D649 Anemia, unspecified: Secondary | ICD-10-CM

## 2015-05-26 MED ORDER — VITAMIN D (ERGOCALCIFEROL) 1.25 MG (50000 UNIT) PO CAPS: 50000.0000 [IU] | ORAL_CAPSULE | ORAL | Status: DC

## 2015-06-14 ENCOUNTER — Ambulatory Visit (INDEPENDENT_AMBULATORY_CARE_PROVIDER_SITE_OTHER): Payer: PPO | Admitting: Internal Medicine

## 2015-06-14 ENCOUNTER — Encounter: Payer: Self-pay | Admitting: Internal Medicine

## 2015-06-14 VITALS — BP 138/80 | HR 82 | Temp 98.1°F | Resp 18 | Ht 61.0 in | Wt 127.0 lb

## 2015-06-14 DIAGNOSIS — K219 Gastro-esophageal reflux disease without esophagitis: Secondary | ICD-10-CM

## 2015-06-14 DIAGNOSIS — D649 Anemia, unspecified: Secondary | ICD-10-CM

## 2015-06-14 DIAGNOSIS — E7800 Pure hypercholesterolemia, unspecified: Secondary | ICD-10-CM

## 2015-06-14 DIAGNOSIS — F419 Anxiety disorder, unspecified: Secondary | ICD-10-CM

## 2015-06-14 DIAGNOSIS — J019 Acute sinusitis, unspecified: Secondary | ICD-10-CM

## 2015-06-14 DIAGNOSIS — I10 Essential (primary) hypertension: Secondary | ICD-10-CM | POA: Diagnosis not present

## 2015-06-14 DIAGNOSIS — R21 Rash and other nonspecific skin eruption: Secondary | ICD-10-CM

## 2015-06-14 DIAGNOSIS — Z91048 Other nonmedicinal substance allergy status: Secondary | ICD-10-CM

## 2015-06-14 DIAGNOSIS — Z9109 Other allergy status, other than to drugs and biological substances: Secondary | ICD-10-CM

## 2015-06-14 LAB — CBC WITH DIFFERENTIAL/PLATELET
BASOS PCT: 0.3 % (ref 0.0–3.0)
Basophils Absolute: 0 10*3/uL (ref 0.0–0.1)
EOS PCT: 3.1 % (ref 0.0–5.0)
Eosinophils Absolute: 0.1 10*3/uL (ref 0.0–0.7)
HCT: 35.8 % — ABNORMAL LOW (ref 36.0–46.0)
HEMOGLOBIN: 11.9 g/dL — AB (ref 12.0–15.0)
LYMPHS ABS: 1.4 10*3/uL (ref 0.7–4.0)
Lymphocytes Relative: 34 % (ref 12.0–46.0)
MCHC: 33.3 g/dL (ref 30.0–36.0)
MCV: 90.8 fl (ref 78.0–100.0)
MONO ABS: 0.3 10*3/uL (ref 0.1–1.0)
MONOS PCT: 7.3 % (ref 3.0–12.0)
NEUTROS PCT: 55.3 % (ref 43.0–77.0)
Neutro Abs: 2.4 10*3/uL (ref 1.4–7.7)
Platelets: 183 10*3/uL (ref 150.0–400.0)
RBC: 3.94 Mil/uL (ref 3.87–5.11)
RDW: 13 % (ref 11.5–15.5)
WBC: 4.3 10*3/uL (ref 4.0–10.5)

## 2015-06-14 LAB — FERRITIN: Ferritin: 45.5 ng/mL (ref 10.0–291.0)

## 2015-06-14 LAB — IBC PANEL
Iron: 74 ug/dL (ref 42–145)
SATURATION RATIOS: 22.6 % (ref 20.0–50.0)
TRANSFERRIN: 234 mg/dL (ref 212.0–360.0)

## 2015-06-14 LAB — VITAMIN B12: VITAMIN B 12: 682 pg/mL (ref 211–911)

## 2015-06-14 MED ORDER — AMOXICILLIN 875 MG PO TABS: 875.0000 mg | ORAL_TABLET | Freq: Two times a day (BID) | ORAL | Status: DC

## 2015-06-14 NOTE — Patient Instructions (Signed)
Robitussin DM twice a day as needed 

## 2015-06-14 NOTE — Progress Notes (Signed)
Pre-visit discussion using our clinic review tool. No additional management support is needed unless otherwise documented below in the visit note.  

## 2015-06-14 NOTE — Progress Notes (Signed)
Patient ID: Patricia Pacheco, female   DOB: [birth year], 74 y.o.   MRN: [id number]   Subjective:    Patient ID: Patricia Pacheco, female    DOB: 07/17/1940, 74 y.o.   MRN: [id number]  HPI  Patient with past history of allergies, GERD and CAD.  She comes in today to follow up on these issues.  She reports that she has recently developed symptoms c/w her sinus infections.  Describes increased pain over the right eye.  Increased congestion and drainage.  She started taking amoxicillin.  Reports only had a few days of pills.  Is better since starting the abx.  Still with increased sinus pressure and congestion.  No chest congestion.  No sob or wheezing.  No longer receiving allergy shots.  No chest pain or tightness.  Some occasional acid reflux.  No abdominal pain or cramping.  Bowels stable.  Saw dermatology.  Started her on TCC and hydroxyzine.  Itching better.  Sleeping better.     Past Medical History  Diagnosis Date  . Asthma   . Arthritis   . GERD (gastroesophageal reflux disease)     EGD (05/25/04) - slight presbyesophagus, mild esophagitis, mild gastritis, mild duodenitis - positive H. pylori.   . Allergy     allergy shots (Juengel)  . Neuropathy ([place])   . CAD (coronary artery disease)     cath 11/27/04 - moderated LAD disease   Past Surgical History  Procedure Laterality Date  . Appendectomy  1967   Family History  Problem Relation Age of Onset  . Arthritis Mother   . Colon polyps Sister    Social History   Social History  . Marital Status: Married    Spouse Name: N/A  . Number of Children: N/A  . Years of Education: N/A   Social History Main Topics  . Smoking status: Never Smoker   . Smokeless tobacco: Never Used  . Alcohol Use: No  . Drug Use: No  . Sexual Activity: Not Asked   Other Topics Concern  . None   Social History Narrative    Outpatient Encounter Prescriptions as of 06/14/2015  Medication Sig  . aspirin 81 MG tablet Take 81 mg by mouth daily.  . Calcium  Carbonate-Vit D-Min (CALCIUM 1200 PO) Take by mouth daily.  . celecoxib (CELEBREX) 200 MG capsule Take 1 capsule (200 mg total) by mouth daily as needed.  . COD LIVER OIL PO Take by mouth.  . docusate sodium (COLACE) 100 MG capsule Take by mouth.  . fexofenadine (ALLEGRA) 180 MG tablet Take 180 mg by mouth daily.  . fluocinolone (SYNALAR) 0.01 % external solution Apply topically 2 (two) times daily.  Marland Kitchen FLUoxetine (PROZAC) 10 MG capsule Take 1 capsule (10 mg total) by mouth daily.  Marland Kitchen lisinopril (PRINIVIL,ZESTRIL) 10 MG tablet Take 2 tablets (20 mg total) by mouth daily.  . Multiple Vitamin (MULTIVITAMIN) tablet Take 1 tablet by mouth daily.  . mupirocin ointment (BACTROBAN) 2 % Place 1 application into the nose 2 (two) times daily.  . Naproxen Sodium (ALEVE PO) Take by mouth as needed.  . Omega-3 Fatty Acids (FISH OIL PO) Take by mouth daily.  Marland Kitchen omeprazole (PRILOSEC) 40 MG capsule Take 1 capsule (40 mg total) by mouth daily.  . pseudoephedrine (SUDAFED) 60 MG tablet Take 60 mg by mouth every 4 (four) hours as needed for congestion.  . silver sulfADIAZINE (SILVADENE) 1 % cream Apply 1 application topically daily.  Marland Kitchen triamcinolone (KENALOG) 0.025 % cream Apply  1 application topically 2 (two) times daily.  Marland Kitchen UNABLE TO FIND   . Vitamin D, Ergocalciferol, (DRISDOL) 50000 UNITS CAPS capsule Take 1 capsule (50,000 Units total) by mouth every 7 (seven) days.  Marland Kitchen amoxicillin (AMOXIL) 875 MG tablet Take 1 tablet (875 mg total) by mouth 2 (two) times daily.   No facility-administered encounter medications on file as of 06/14/2015.    Review of Systems  Constitutional: Negative for appetite change and unexpected weight change.  HENT: Positive for congestion, postnasal drip and sinus pressure.   Eyes: Negative for discharge and redness.  Respiratory: Negative for cough, chest tightness and shortness of breath.   Cardiovascular: Negative for chest pain, palpitations and leg swelling.  Gastrointestinal:  Negative for nausea, vomiting, abdominal pain and diarrhea.  Genitourinary: Negative for dysuria and difficulty urinating.  Musculoskeletal: Negative for back pain and joint swelling.  Skin: Negative for color change and rash.  Neurological: Negative for dizziness, light-headedness and headaches.  Psychiatric/Behavioral: Negative for dysphoric mood and agitation.       Objective:    Physical Exam  Constitutional: She appears well-developed and well-nourished. No distress.  HENT:  Mouth/Throat: Oropharynx is clear and moist.  Nares - slightly erythematous turbinates.  Minimal tenderness to palpation.    Eyes: Conjunctivae are normal. Right eye exhibits no discharge. Left eye exhibits no discharge.  Neck: Neck supple. No thyromegaly present.  Cardiovascular: Normal rate and regular rhythm.   Pulmonary/Chest: Breath sounds normal. No respiratory distress. She has no wheezes.  Abdominal: Soft. Bowel sounds are normal. There is no tenderness.  Musculoskeletal: She exhibits no edema or tenderness.  Lymphadenopathy:    She has no cervical adenopathy.  Skin: No rash noted. No erythema.  Psychiatric: She has a normal mood and affect. Her behavior is normal.    BP 138/80 mmHg  Pulse 82  Temp(Src) 98.1 F (36.7 C) (Oral)  Resp 18  Ht 5\' 1"  (1.549 m)  Wt 127 lb (57.607 kg)  BMI 24.01 kg/m2  SpO2 98%  LMP 08/29/1995 Wt Readings from Last 3 Encounters:  06/14/15 127 lb (57.607 kg)  05/11/15 125 lb (56.7 kg)  03/31/15 127 lb (57.607 kg)     Lab Results  Component Value Date   WBC 4.3 06/14/2015   HGB 11.9* 06/14/2015   HCT 35.8* 06/14/2015   PLT 183.0 06/14/2015   GLUCOSE 80 05/25/2015   CHOL 206* 05/25/2015   TRIG 75.0 05/25/2015   HDL 58.50 05/25/2015   LDLDIRECT 115.0 09/04/2012   LDLCALC 132* 05/25/2015   ALT 14 05/25/2015   AST 17 05/25/2015   NA 141 05/25/2015   K 4.1 05/25/2015   CL 104 05/25/2015   CREATININE 0.94 05/25/2015   BUN 15 05/25/2015   CO2 32  05/25/2015   TSH 1.16 05/25/2015    Mm Digital Screening Bilateral  05/10/2015  CLINICAL DATA:  Screening. EXAM: DIGITAL SCREENING BILATERAL MAMMOGRAM WITH CAD COMPARISON:  None. ACR Breast Density Category b: There are scattered areas of fibroglandular density. FINDINGS: There are no findings suspicious for malignancy. Images were processed with CAD. IMPRESSION: No mammographic evidence of malignancy. A result letter of this screening mammogram will be mailed directly to the patient. RECOMMENDATION: Screening mammogram in one year. (Code:SM-B-01Y) BI-RADS CATEGORY  1: Negative. Electronically Signed   By: Lajean Manes M.D.   On: 05/10/2015 08:31       Assessment & Plan:   Problem List Items Addressed This Visit    Anxiety    On prozac.  Doing better.  Feels she is handling things relatively well.  Follow.        Environmental allergies    Followed by Dr Kathyrn Sheriff.  Off allergy injections.  Continue same medication regimen.  Follow.  Treat sinus infection.       Essential hypertension - Primary    Blood pressure under reasonable control.  Follow pressures.  Same medication regimen.  Follow metabolic panel.       GERD (gastroesophageal reflux disease)    Some occasional acid reflux.  On omeprazole.  She feels related to stress.  Discussed GI referral.  She declines.  Wants to monitor.  Get her back in soon to reassess.  If persistent symptoms, will require GI referral.        Pure hypercholesterolemia    Low cholesterol diet and exercise.  Follow lipid panel.       Rash    Saw dermatology.  Continue TCC and hydroxyzine as directed.  Follow.        Sinusitis    Had a history of allergies and reoccurring sinus infections.  She has started on abx for her sinus infection.  Saline nasal spray and nasacort nasal spray as directed.  Will continue amoxicillin as directed.  mucinex and robitussin as directed.  Follow.       Relevant Medications   amoxicillin (AMOXIL) 875 MG tablet      Other Visit Diagnoses    Anemia, unspecified anemia type            Einar Pheasant, MD

## 2015-06-15 ENCOUNTER — Encounter: Payer: Self-pay | Admitting: *Deleted

## 2015-06-20 ENCOUNTER — Encounter: Payer: Self-pay | Admitting: Internal Medicine

## 2015-06-20 NOTE — Assessment & Plan Note (Signed)
Followed by Dr Kathyrn Sheriff.  Off allergy injections.  Continue same medication regimen.  Follow.  Treat sinus infection.

## 2015-06-20 NOTE — Assessment & Plan Note (Signed)
Had a history of allergies and reoccurring sinus infections.  She has started on abx for her sinus infection.  Saline nasal spray and nasacort nasal spray as directed.  Will continue amoxicillin as directed.  mucinex and robitussin as directed.  Follow.

## 2015-06-20 NOTE — Assessment & Plan Note (Signed)
On prozac.  Doing better.  Feels she is handling things relatively well.  Follow.

## 2015-06-20 NOTE — Assessment & Plan Note (Signed)
Some occasional acid reflux.  On omeprazole.  She feels related to stress.  Discussed GI referral.  She declines.  Wants to monitor.  Get her back in soon to reassess.  If persistent symptoms, will require GI referral.

## 2015-06-20 NOTE — Assessment & Plan Note (Signed)
Blood pressure under reasonable control.  Follow pressures.  Same medication regimen.  Follow metabolic panel.   

## 2015-06-20 NOTE — Assessment & Plan Note (Signed)
Saw dermatology.  Continue TCC and hydroxyzine as directed.  Follow.

## 2015-06-20 NOTE — Assessment & Plan Note (Signed)
Low cholesterol diet and exercise.  Follow lipid panel.   

## 2015-06-23 ENCOUNTER — Other Ambulatory Visit: Payer: PPO

## 2015-06-29 DIAGNOSIS — M9903 Segmental and somatic dysfunction of lumbar region: Secondary | ICD-10-CM | POA: Diagnosis not present

## 2015-06-29 DIAGNOSIS — M9905 Segmental and somatic dysfunction of pelvic region: Secondary | ICD-10-CM | POA: Diagnosis not present

## 2015-06-29 DIAGNOSIS — M5416 Radiculopathy, lumbar region: Secondary | ICD-10-CM | POA: Diagnosis not present

## 2015-06-29 DIAGNOSIS — M955 Acquired deformity of pelvis: Secondary | ICD-10-CM | POA: Diagnosis not present

## 2015-07-01 ENCOUNTER — Telehealth: Payer: Self-pay | Admitting: Internal Medicine

## 2015-07-01 DIAGNOSIS — D649 Anemia, unspecified: Secondary | ICD-10-CM

## 2015-07-01 DIAGNOSIS — I10 Essential (primary) hypertension: Secondary | ICD-10-CM

## 2015-07-01 DIAGNOSIS — E7800 Pure hypercholesterolemia, unspecified: Secondary | ICD-10-CM

## 2015-07-01 NOTE — Telephone Encounter (Signed)
Please advise, last labs were 06/14/15. thanks

## 2015-07-01 NOTE — Telephone Encounter (Signed)
Pt would like lab work before she comes in on 08/23/2015 if needed. Need orders please and thank you! Call @ [phone]. Thank you!

## 2015-07-02 NOTE — Telephone Encounter (Signed)
Orders placed for labs.  Please schedule a fasting lab appt 1-2 days before her 08/23/15 appt.

## 2015-07-04 NOTE — Telephone Encounter (Signed)
Pt is scheduled °

## 2015-07-04 NOTE — Telephone Encounter (Signed)
Please schedule labs per request.  thanks

## 2015-07-06 ENCOUNTER — Ambulatory Visit: Payer: PPO | Admitting: Internal Medicine

## 2015-07-06 DIAGNOSIS — R238 Other skin changes: Secondary | ICD-10-CM | POA: Diagnosis not present

## 2015-07-06 DIAGNOSIS — L821 Other seborrheic keratosis: Secondary | ICD-10-CM | POA: Diagnosis not present

## 2015-07-06 DIAGNOSIS — L2089 Other atopic dermatitis: Secondary | ICD-10-CM | POA: Diagnosis not present

## 2015-07-06 DIAGNOSIS — R208 Other disturbances of skin sensation: Secondary | ICD-10-CM | POA: Diagnosis not present

## 2015-07-06 DIAGNOSIS — Z789 Other specified health status: Secondary | ICD-10-CM | POA: Diagnosis not present

## 2015-07-06 DIAGNOSIS — B078 Other viral warts: Secondary | ICD-10-CM | POA: Diagnosis not present

## 2015-07-06 DIAGNOSIS — L538 Other specified erythematous conditions: Secondary | ICD-10-CM | POA: Diagnosis not present

## 2015-07-15 ENCOUNTER — Other Ambulatory Visit (INDEPENDENT_AMBULATORY_CARE_PROVIDER_SITE_OTHER): Payer: PPO

## 2015-07-15 DIAGNOSIS — I10 Essential (primary) hypertension: Secondary | ICD-10-CM | POA: Diagnosis not present

## 2015-07-15 DIAGNOSIS — E7800 Pure hypercholesterolemia, unspecified: Secondary | ICD-10-CM | POA: Diagnosis not present

## 2015-07-15 DIAGNOSIS — D649 Anemia, unspecified: Secondary | ICD-10-CM

## 2015-07-15 LAB — CBC WITH DIFFERENTIAL/PLATELET
BASOS PCT: 0.4 % (ref 0.0–3.0)
Basophils Absolute: 0 10*3/uL (ref 0.0–0.1)
EOS ABS: 0.2 10*3/uL (ref 0.0–0.7)
EOS PCT: 4 % (ref 0.0–5.0)
HCT: 37.6 % (ref 36.0–46.0)
Hemoglobin: 12.3 g/dL (ref 12.0–15.0)
LYMPHS ABS: 1.9 10*3/uL (ref 0.7–4.0)
Lymphocytes Relative: 38.9 % (ref 12.0–46.0)
MCHC: 32.8 g/dL (ref 30.0–36.0)
MCV: 91.1 fl (ref 78.0–100.0)
MONO ABS: 0.3 10*3/uL (ref 0.1–1.0)
Monocytes Relative: 5.9 % (ref 3.0–12.0)
NEUTROS ABS: 2.5 10*3/uL (ref 1.4–7.7)
NEUTROS PCT: 50.8 % (ref 43.0–77.0)
PLATELETS: 186 10*3/uL (ref 150.0–400.0)
RBC: 4.13 Mil/uL (ref 3.87–5.11)
RDW: 13.3 % (ref 11.5–15.5)
WBC: 4.9 10*3/uL (ref 4.0–10.5)

## 2015-07-15 LAB — BASIC METABOLIC PANEL
BUN: 14 mg/dL (ref 6–23)
CALCIUM: 9.1 mg/dL (ref 8.4–10.5)
CO2: 30 mEq/L (ref 19–32)
CREATININE: 0.91 mg/dL (ref 0.40–1.20)
Chloride: 103 mEq/L (ref 96–112)
GFR: 63.9 mL/min (ref >60.00)
Glucose, Bld: 89 mg/dL (ref 70–99)
Potassium: 3.8 mEq/L (ref 3.5–5.1)
SODIUM: 140 meq/L (ref 135–145)

## 2015-07-15 LAB — LIPID PANEL
CHOL/HDL RATIO: 3
Cholesterol: 213 mg/dL — ABNORMAL HIGH (ref 0–200)
HDL: 67.1 mg/dL (ref >39.00)
LDL Cholesterol: 129 mg/dL — ABNORMAL HIGH (ref 0–99)
NonHDL: 146.03
TRIGLYCERIDES: 84 mg/dL (ref 0.0–149.0)
VLDL: 16.8 mg/dL (ref 0.0–40.0)

## 2015-07-15 LAB — HEPATIC FUNCTION PANEL
ALBUMIN: 4.2 g/dL (ref 3.5–5.2)
ALK PHOS: 74 U/L (ref 39–117)
ALT: 14 U/L (ref 0–35)
AST: 19 U/L (ref 0–37)
BILIRUBIN DIRECT: 0.1 mg/dL (ref 0.0–0.3)
TOTAL PROTEIN: 7.1 g/dL (ref 6.0–8.3)
Total Bilirubin: 0.4 mg/dL (ref 0.2–1.2)

## 2015-07-15 LAB — FERRITIN: FERRITIN: 47.3 ng/mL (ref 10.0–291.0)

## 2015-07-18 ENCOUNTER — Encounter: Payer: Self-pay | Admitting: *Deleted

## 2015-07-27 ENCOUNTER — Other Ambulatory Visit: Payer: Self-pay | Admitting: Internal Medicine

## 2015-07-27 DIAGNOSIS — M9903 Segmental and somatic dysfunction of lumbar region: Secondary | ICD-10-CM | POA: Diagnosis not present

## 2015-07-27 DIAGNOSIS — M955 Acquired deformity of pelvis: Secondary | ICD-10-CM | POA: Diagnosis not present

## 2015-07-27 DIAGNOSIS — M9905 Segmental and somatic dysfunction of pelvic region: Secondary | ICD-10-CM | POA: Diagnosis not present

## 2015-07-27 DIAGNOSIS — M5416 Radiculopathy, lumbar region: Secondary | ICD-10-CM | POA: Diagnosis not present

## 2015-08-01 ENCOUNTER — Ambulatory Visit: Payer: PPO | Admitting: Internal Medicine

## 2015-08-09 ENCOUNTER — Telehealth: Payer: Self-pay | Admitting: Internal Medicine

## 2015-08-09 NOTE — Telephone Encounter (Signed)
Left msg to call office to schedule AWV with Denisa/msn °

## 2015-08-19 ENCOUNTER — Ambulatory Visit: Payer: PPO | Admitting: Internal Medicine

## 2015-08-23 ENCOUNTER — Encounter: Payer: Self-pay | Admitting: Internal Medicine

## 2015-08-23 ENCOUNTER — Ambulatory Visit (INDEPENDENT_AMBULATORY_CARE_PROVIDER_SITE_OTHER): Payer: PPO | Admitting: Internal Medicine

## 2015-08-23 VITALS — BP 120/80 | HR 77 | Temp 98.1°F | Resp 18 | Ht 61.0 in | Wt 127.5 lb

## 2015-08-23 DIAGNOSIS — I10 Essential (primary) hypertension: Secondary | ICD-10-CM

## 2015-08-23 DIAGNOSIS — F419 Anxiety disorder, unspecified: Secondary | ICD-10-CM

## 2015-08-23 DIAGNOSIS — K219 Gastro-esophageal reflux disease without esophagitis: Secondary | ICD-10-CM | POA: Diagnosis not present

## 2015-08-23 DIAGNOSIS — E7800 Pure hypercholesterolemia, unspecified: Secondary | ICD-10-CM

## 2015-08-23 DIAGNOSIS — R21 Rash and other nonspecific skin eruption: Secondary | ICD-10-CM

## 2015-08-23 MED ORDER — LISINOPRIL 30 MG PO TABS: 30.0000 mg | ORAL_TABLET | Freq: Every day | ORAL | Status: DC

## 2015-08-23 MED ORDER — ROSUVASTATIN CALCIUM 5 MG PO TABS: ORAL_TABLET | ORAL | Status: DC

## 2015-08-23 MED ORDER — ALPRAZOLAM 0.25 MG PO TABS: 0.2500 mg | ORAL_TABLET | Freq: Two times a day (BID) | ORAL | Status: DC | PRN

## 2015-08-23 NOTE — Progress Notes (Signed)
Pre-visit discussion using our clinic review tool. No additional management support is needed unless otherwise documented below in the visit note.  

## 2015-08-23 NOTE — Patient Instructions (Signed)
Start crestor - take three days per week.  Increase lisinopril to 30mg  per day

## 2015-08-23 NOTE — Progress Notes (Signed)
Patient ID: Patricia Pacheco, female   DOB: 13-Jun-1941, 75 y.o.   MRN: [id number]   Subjective:    Patient ID: Patricia Pacheco, female    DOB: 10/14/1940, 75 y.o.   MRN: [id number]  HPI  Patient with past history of GERD, CAD, hypertension and hypercholesterolemia.  She comes in today to follow up on these issues.  Increased stress.  Feels she is handling things relatively well.  Blood pressure stable.  Having issues with her eyes (cornea).  Being followed by opthalmology.  Previous rash.  Better.  No chest pain or tightness.  No sob. No abdominal pain or cramping.  Bowels stable.     Past Medical History  Diagnosis Date  . Asthma   . Arthritis   . GERD (gastroesophageal reflux disease)     EGD (05/25/04) - slight presbyesophagus, mild esophagitis, mild gastritis, mild duodenitis - positive H. pylori.   . Allergy     allergy shots (Juengel)  . Neuropathy ([place])   . CAD (coronary artery disease)     cath 11/27/04 - moderated LAD disease   Past Surgical History  Procedure Laterality Date  . Appendectomy  1967   Family History  Problem Relation Age of Onset  . Arthritis Mother   . Colon polyps Sister    Social History   Social History  . Marital Status: Married    Spouse Name: N/A  . Number of Children: N/A  . Years of Education: N/A   Social History Main Topics  . Smoking status: Never Smoker   . Smokeless tobacco: Never Used  . Alcohol Use: No  . Drug Use: No  . Sexual Activity: Not Asked   Other Topics Concern  . None   Social History Narrative     Review of Systems  Constitutional: Negative for appetite change and unexpected weight change.  HENT: Negative for congestion and sinus pressure.   Respiratory: Negative for cough, chest tightness and shortness of breath.   Cardiovascular: Negative for chest pain, palpitations and leg swelling.  Gastrointestinal: Negative for nausea, vomiting, abdominal pain and diarrhea.  Genitourinary: Negative for dysuria and difficulty  urinating.  Musculoskeletal: Negative for back pain and joint swelling.  Skin: Negative for color change.       Rash better.    Neurological: Negative for dizziness, light-headedness and headaches.  Psychiatric/Behavioral: Negative for dysphoric mood and agitation.       Objective:    Physical Exam  Constitutional: She appears well-developed and well-nourished. No distress.  HENT:  Nose: Nose normal.  Mouth/Throat: Oropharynx is clear and moist.  Eyes: Conjunctivae are normal. Right eye exhibits no discharge. Left eye exhibits no discharge.  Neck: Neck supple. No thyromegaly present.  Cardiovascular: Normal rate and regular rhythm.   Pulmonary/Chest: Breath sounds normal. No respiratory distress. She has no wheezes.  Abdominal: Soft. Bowel sounds are normal. There is no tenderness.  Musculoskeletal: She exhibits no edema or tenderness.  Lymphadenopathy:    She has no cervical adenopathy.  Skin: Skin is warm and dry. No erythema.  Psychiatric: She has a normal mood and affect. Her behavior is normal.    BP 120/80 mmHg  Pulse 77  Temp(Src) 98.1 F (36.7 C) (Oral)  Resp 18  Ht 5\' 1"  (1.549 m)  Wt 127 lb 8 oz (57.834 kg)  BMI 24.10 kg/m2  SpO2 96%  LMP 08/29/1995 Wt Readings from Last 3 Encounters:  08/23/15 127 lb 8 oz (57.834 kg)  06/14/15 127 lb (57.607 kg)  05/11/15 125 lb (56.7 kg)     Lab Results  Component Value Date   WBC 4.9 07/15/2015   HGB 12.3 07/15/2015   HCT 37.6 07/15/2015   PLT 186.0 07/15/2015   GLUCOSE 89 07/15/2015   CHOL 213* 07/15/2015   TRIG 84.0 07/15/2015   HDL 67.10 07/15/2015   LDLDIRECT 115.0 09/04/2012   LDLCALC 129* 07/15/2015   ALT 14 07/15/2015   AST 19 07/15/2015   NA 140 07/15/2015   K 3.8 07/15/2015   CL 103 07/15/2015   CREATININE 0.91 07/15/2015   BUN 14 07/15/2015   CO2 30 07/15/2015   TSH 1.16 05/25/2015    Mm Digital Screening Bilateral  05/10/2015  CLINICAL DATA:  Screening. EXAM: DIGITAL SCREENING BILATERAL  MAMMOGRAM WITH CAD COMPARISON:  None. ACR Breast Density Category b: There are scattered areas of fibroglandular density. FINDINGS: There are no findings suspicious for malignancy. Images were processed with CAD. IMPRESSION: No mammographic evidence of malignancy. A result letter of this screening mammogram will be mailed directly to the patient. RECOMMENDATION: Screening mammogram in one year. (Code:SM-B-01Y) BI-RADS CATEGORY  1: Negative. Electronically Signed   By: Lajean Manes M.D.   On: 05/10/2015 08:31       Assessment & Plan:   Problem List Items Addressed This Visit    Anxiety    On prozac.  Feels better.  Follow.        Relevant Medications   ALPRAZolam (XANAX) 0.25 MG tablet   Essential hypertension    Blood pressure on my check slightly elevated.  She states she has been averaging 0000000 systolic readings.  Will increase to 30mg  lisinopril q day.   Follow pressures.  Follow metabolic panel.        Relevant Medications   rosuvastatin (CRESTOR) 5 MG tablet   lisinopril (PRINIVIL,ZESTRIL) 30 MG tablet   GERD (gastroesophageal reflux disease)    On omeprazole.  Overall stable.  Follow.        Pure hypercholesterolemia - Primary    Discussed lab results.  Start crestor.  Low cholesterol diet and exercise.  Follow lipid panel and liver function tests.        Relevant Medications   rosuvastatin (CRESTOR) 5 MG tablet   lisinopril (PRINIVIL,ZESTRIL) 30 MG tablet   Other Relevant Orders   Hepatic function panel   Rash    Saw dermatology.  Is better.  Follow.           Einar Pheasant, MD

## 2015-08-24 DIAGNOSIS — M9903 Segmental and somatic dysfunction of lumbar region: Secondary | ICD-10-CM | POA: Diagnosis not present

## 2015-08-24 DIAGNOSIS — M955 Acquired deformity of pelvis: Secondary | ICD-10-CM | POA: Diagnosis not present

## 2015-08-24 DIAGNOSIS — M5416 Radiculopathy, lumbar region: Secondary | ICD-10-CM | POA: Diagnosis not present

## 2015-08-24 DIAGNOSIS — M9905 Segmental and somatic dysfunction of pelvic region: Secondary | ICD-10-CM | POA: Diagnosis not present

## 2015-08-28 ENCOUNTER — Encounter: Payer: Self-pay | Admitting: Internal Medicine

## 2015-08-28 NOTE — Assessment & Plan Note (Addendum)
Blood pressure on my check slightly elevated.  She states she has been averaging 0000000 systolic readings.  Will increase to 30mg  lisinopril q day.   Follow pressures.  Follow metabolic panel.

## 2015-08-28 NOTE — Assessment & Plan Note (Signed)
On prozac.  Feels better.  Follow.

## 2015-08-28 NOTE — Assessment & Plan Note (Signed)
Saw dermatology.  Is better.  Follow.

## 2015-08-28 NOTE — Assessment & Plan Note (Signed)
On omeprazole.  Overall stable.  Follow.  

## 2015-08-28 NOTE — Assessment & Plan Note (Addendum)
Discussed lab results.  Start crestor.  Low cholesterol diet and exercise.  Follow lipid panel and liver function tests.

## 2015-09-28 DIAGNOSIS — M5416 Radiculopathy, lumbar region: Secondary | ICD-10-CM | POA: Diagnosis not present

## 2015-09-28 DIAGNOSIS — M9903 Segmental and somatic dysfunction of lumbar region: Secondary | ICD-10-CM | POA: Diagnosis not present

## 2015-09-28 DIAGNOSIS — M955 Acquired deformity of pelvis: Secondary | ICD-10-CM | POA: Diagnosis not present

## 2015-09-28 DIAGNOSIS — M9905 Segmental and somatic dysfunction of pelvic region: Secondary | ICD-10-CM | POA: Diagnosis not present

## 2015-10-05 ENCOUNTER — Other Ambulatory Visit (INDEPENDENT_AMBULATORY_CARE_PROVIDER_SITE_OTHER): Payer: PPO

## 2015-10-05 DIAGNOSIS — E7800 Pure hypercholesterolemia, unspecified: Secondary | ICD-10-CM | POA: Diagnosis not present

## 2015-10-06 LAB — HEPATIC FUNCTION PANEL
ALK PHOS: 63 U/L (ref 39–117)
ALT: 14 U/L (ref 0–35)
AST: 19 U/L (ref 0–37)
Albumin: 4.2 g/dL (ref 3.5–5.2)
BILIRUBIN DIRECT: 0.1 mg/dL (ref 0.0–0.3)
TOTAL PROTEIN: 6.8 g/dL (ref 6.0–8.3)
Total Bilirubin: 0.4 mg/dL (ref 0.2–1.2)

## 2015-10-25 ENCOUNTER — Other Ambulatory Visit: Payer: Self-pay | Admitting: Internal Medicine

## 2015-10-26 DIAGNOSIS — M5416 Radiculopathy, lumbar region: Secondary | ICD-10-CM | POA: Diagnosis not present

## 2015-10-26 DIAGNOSIS — M955 Acquired deformity of pelvis: Secondary | ICD-10-CM | POA: Diagnosis not present

## 2015-10-26 DIAGNOSIS — M9903 Segmental and somatic dysfunction of lumbar region: Secondary | ICD-10-CM | POA: Diagnosis not present

## 2015-10-26 DIAGNOSIS — M9905 Segmental and somatic dysfunction of pelvic region: Secondary | ICD-10-CM | POA: Diagnosis not present

## 2015-10-26 NOTE — Telephone Encounter (Signed)
Xanax rx faxed to pharmacy. 

## 2015-10-26 NOTE — Telephone Encounter (Signed)
Xanax refill request.  Last filled 08/23/2015, last seen 08/23/2015.  Please advise.

## 2015-10-26 NOTE — Telephone Encounter (Signed)
ok'd rx for xanax #30 with no refills.   

## 2015-11-08 ENCOUNTER — Ambulatory Visit: Payer: PPO | Admitting: Internal Medicine

## 2015-11-29 ENCOUNTER — Other Ambulatory Visit: Payer: Self-pay | Admitting: Internal Medicine

## 2015-12-07 DIAGNOSIS — M955 Acquired deformity of pelvis: Secondary | ICD-10-CM | POA: Diagnosis not present

## 2015-12-07 DIAGNOSIS — M9905 Segmental and somatic dysfunction of pelvic region: Secondary | ICD-10-CM | POA: Diagnosis not present

## 2015-12-07 DIAGNOSIS — M9903 Segmental and somatic dysfunction of lumbar region: Secondary | ICD-10-CM | POA: Diagnosis not present

## 2015-12-07 DIAGNOSIS — M5416 Radiculopathy, lumbar region: Secondary | ICD-10-CM | POA: Diagnosis not present

## 2015-12-21 DIAGNOSIS — H26491 Other secondary cataract, right eye: Secondary | ICD-10-CM | POA: Diagnosis not present

## 2015-12-23 ENCOUNTER — Ambulatory Visit (INDEPENDENT_AMBULATORY_CARE_PROVIDER_SITE_OTHER): Payer: PPO | Admitting: Internal Medicine

## 2015-12-23 ENCOUNTER — Encounter: Payer: Self-pay | Admitting: Internal Medicine

## 2015-12-23 VITALS — BP 128/68 | HR 67 | Temp 98.4°F | Resp 12 | Ht 61.0 in | Wt 128.8 lb

## 2015-12-23 DIAGNOSIS — E7800 Pure hypercholesterolemia, unspecified: Secondary | ICD-10-CM | POA: Diagnosis not present

## 2015-12-23 DIAGNOSIS — Z91048 Other nonmedicinal substance allergy status: Secondary | ICD-10-CM | POA: Diagnosis not present

## 2015-12-23 DIAGNOSIS — I10 Essential (primary) hypertension: Secondary | ICD-10-CM

## 2015-12-23 DIAGNOSIS — H9191 Unspecified hearing loss, right ear: Secondary | ICD-10-CM

## 2015-12-23 DIAGNOSIS — F419 Anxiety disorder, unspecified: Secondary | ICD-10-CM

## 2015-12-23 DIAGNOSIS — Z9109 Other allergy status, other than to drugs and biological substances: Secondary | ICD-10-CM

## 2015-12-23 DIAGNOSIS — K219 Gastro-esophageal reflux disease without esophagitis: Secondary | ICD-10-CM | POA: Diagnosis not present

## 2015-12-23 DIAGNOSIS — J3489 Other specified disorders of nose and nasal sinuses: Secondary | ICD-10-CM

## 2015-12-23 NOTE — Progress Notes (Signed)
Patient ID: Patricia Pacheco, female   DOB: Apr 17, 1941, 75 y.o.   MRN: [id number]   Subjective:    Patient ID: Patricia Pacheco, female    DOB: 13-Oct-1940, 75 y.o.   MRN: [id number]  HPI  Patient here for a scheduled follow up.   She states she is doing better.  Feels better.  Handling stress better.  She is having issues with her eyes.  Seeing opthalmology.  Some increased stress related to this.  Tries to stay active.  No cardiac symptoms with increased activity or exertion.  No sob.  No acid reflux.  No abdominal pain or cramping.  Bowels stable.  She started having some muscle aches with her cholesterol medication.  Started CoQ 10.  Better now/resolved.  She is still having issues with congestion/throat congestion.  Has tried antihistamines, nasal sprays and PPIs.  No change with any of the treatment.  Has seen ENT.  Request to see Dr Pryor Ochoa.     Past Medical History  Diagnosis Date  . Asthma   . Arthritis   . GERD (gastroesophageal reflux disease)     EGD (05/25/04) - slight presbyesophagus, mild esophagitis, mild gastritis, mild duodenitis - positive H. pylori.   . Allergy     allergy shots (Juengel)  . Neuropathy ([place])   . CAD (coronary artery disease)     cath 11/27/04 - moderated LAD disease   Past Surgical History  Procedure Laterality Date  . Appendectomy  1967   Family History  Problem Relation Age of Onset  . Arthritis Mother   . Colon polyps Sister    Social History   Social History  . Marital Status: Married    Spouse Name: N/A  . Number of Children: N/A  . Years of Education: N/A   Social History Main Topics  . Smoking status: Never Smoker   . Smokeless tobacco: Never Used  . Alcohol Use: No  . Drug Use: No  . Sexual Activity: Not Asked   Other Topics Concern  . None   Social History Narrative    Outpatient Encounter Prescriptions as of 12/23/2015  Medication Sig  . ALPRAZolam (XANAX) 0.25 MG tablet Take 1 tablet (0.25 mg total) by mouth daily as needed. for  anxiety  . aspirin 81 MG tablet Take 81 mg by mouth daily.  . Calcium Carbonate-Vit D-Min (CALCIUM 1200 PO) Take by mouth daily.  . celecoxib (CELEBREX) 200 MG capsule Take 1 capsule (200 mg total) by mouth daily as needed.  . CHLORPHENIRAMINE-PHENYLEPHRINE PO Take 1 tablet by mouth every 4 (four) hours as needed.  . COD LIVER OIL PO Take by mouth.  . Coenzyme Q10 (CO Q-10) 100 MG CAPS Take 1 capsule by mouth daily.  Marland Kitchen docusate sodium (COLACE) 100 MG capsule Take by mouth.  . fexofenadine (ALLEGRA) 180 MG tablet Take 180 mg by mouth daily.  . fluocinolone (SYNALAR) 0.01 % external solution Apply topically 2 (two) times daily.  Marland Kitchen FLUoxetine (PROZAC) 10 MG capsule TAKE 1 CAPSULE EVERY DAY  . lisinopril (PRINIVIL,ZESTRIL) 30 MG tablet TAKE 1 TABLET BY MOUTH DAILY  . Multiple Vitamin (MULTIVITAMIN) tablet Take 1 tablet by mouth daily.  . mupirocin ointment (BACTROBAN) 2 % Place 1 application into the nose 2 (two) times daily.  . Naproxen Sodium (ALEVE PO) Take by mouth as needed.  . Omega-3 Fatty Acids (FISH OIL PO) Take by mouth daily.  Marland Kitchen omeprazole (PRILOSEC) 40 MG capsule Take 1 capsule (40 mg total) by mouth daily.  Marland Kitchen  rosuvastatin (CRESTOR) 5 MG tablet Take one tablet q Monday, Wednesday and Friday  . silver sulfADIAZINE (SILVADENE) 1 % cream Apply 1 application topically daily.  Marland Kitchen triamcinolone (KENALOG) 0.025 % cream Apply 1 application topically 2 (two) times daily.  Marland Kitchen UNABLE TO FIND   . [DISCONTINUED] Vitamin D, Ergocalciferol, (DRISDOL) 50000 UNITS CAPS capsule Take 1 capsule (50,000 Units total) by mouth every 7 (seven) days.  . pseudoephedrine (SUDAFED) 60 MG tablet Take 60 mg by mouth every 4 (four) hours as needed for congestion. Reported on 12/23/2015   No facility-administered encounter medications on file as of 12/23/2015.    Review of Systems  Constitutional: Negative for appetite change and unexpected weight change.  HENT: Positive for congestion and postnasal drip.     Respiratory: Negative for cough, chest tightness and shortness of breath.   Cardiovascular: Negative for chest pain, palpitations and leg swelling.  Gastrointestinal: Negative for nausea, vomiting, abdominal pain and diarrhea.  Genitourinary: Negative for dysuria and difficulty urinating.  Musculoskeletal: Negative for back pain and joint swelling.  Skin: Negative for color change and rash.  Neurological: Negative for dizziness, light-headedness and headaches.  Psychiatric/Behavioral: Negative for dysphoric mood and agitation.       Objective:    Physical Exam  Constitutional: She appears well-developed and well-nourished. No distress.  HENT:  Nose: Nose normal.  Mouth/Throat: Oropharynx is clear and moist.  Neck: Neck supple. No thyromegaly present.  Cardiovascular: Normal rate and regular rhythm.   Pulmonary/Chest: Breath sounds normal. No respiratory distress. She has no wheezes.  Abdominal: Soft. Bowel sounds are normal. There is no tenderness.  Musculoskeletal: She exhibits no edema or tenderness.  Lymphadenopathy:    She has no cervical adenopathy.  Skin: No rash noted. No erythema.  Psychiatric: She has a normal mood and affect. Her behavior is normal.    BP 128/68 mmHg  Pulse 67  Temp(Src) 98.4 F (36.9 C) (Oral)  Resp 12  Ht 5\' 1"  (1.549 m)  Wt 128 lb 12 oz (58.401 kg)  BMI 24.34 kg/m2  SpO2 98%  LMP 08/29/1995 Wt Readings from Last 3 Encounters:  12/23/15 128 lb 12 oz (58.401 kg)  08/23/15 127 lb 8 oz (57.834 kg)  06/14/15 127 lb (57.607 kg)     Lab Results  Component Value Date   WBC 4.9 07/15/2015   HGB 12.3 07/15/2015   HCT 37.6 07/15/2015   PLT 186.0 07/15/2015   GLUCOSE 89 07/15/2015   CHOL 213* 07/15/2015   TRIG 84.0 07/15/2015   HDL 67.10 07/15/2015   LDLDIRECT 115.0 09/04/2012   LDLCALC 129* 07/15/2015   ALT 14 10/05/2015   AST 19 10/05/2015   NA 140 07/15/2015   K 3.8 07/15/2015   CL 103 07/15/2015   CREATININE 0.91 07/15/2015   BUN  14 07/15/2015   CO2 30 07/15/2015   TSH 1.16 05/25/2015    Mm Digital Screening Bilateral  05/10/2015  CLINICAL DATA:  Screening. EXAM: DIGITAL SCREENING BILATERAL MAMMOGRAM WITH CAD COMPARISON:  None. ACR Breast Density Category b: There are scattered areas of fibroglandular density. FINDINGS: There are no findings suspicious for malignancy. Images were processed with CAD. IMPRESSION: No mammographic evidence of malignancy. A result letter of this screening mammogram will be mailed directly to the patient. RECOMMENDATION: Screening mammogram in one year. (Code:SM-B-01Y) BI-RADS CATEGORY  1: Negative. Electronically Signed   By: Lajean Manes M.D.   On: 05/10/2015 08:31       Assessment & Plan:   Problem List Items  Addressed This Visit    Anxiety    On prozac.  Doing better.  Feels better.  Follow.       Environmental allergies    Was followed by Dr Kathyrn Sheriff.  She stopped allergy shots.  Medication not helping.  Refer back to ENT.  She wants to see Dr Pryor Ochoa.        Relevant Orders   Ambulatory referral to ENT   Essential hypertension    Blood pressure under good control.  Continue same medication regimen.  Follow pressures.  Follow metabolic panel.        Relevant Orders   Basic metabolic panel   GERD (gastroesophageal reflux disease)    On prilosec.  Controlled.        Hearing loss    States was evaluated and told needed hearing aid.  Declined.   Follow.        Relevant Orders   Ambulatory referral to ENT   Nasal lesion    Intermittent flare.  Uses bactroban intermittently.  Have ENT evaluate.       Relevant Orders   Ambulatory referral to ENT   Pure hypercholesterolemia - Primary    On crestor.  Tolerating since starting CoQ 10.  Low cholesterol diet and exercise.   Follow lipid panel and liver function tests.       Relevant Orders   Lipid panel   Hepatic function panel       Einar Pheasant, MD

## 2015-12-23 NOTE — Progress Notes (Signed)
Pre-visit discussion using our clinic review tool. No additional management support is needed unless otherwise documented below in the visit note.  

## 2015-12-24 ENCOUNTER — Encounter: Payer: Self-pay | Admitting: Internal Medicine

## 2015-12-24 NOTE — Assessment & Plan Note (Signed)
States was evaluated and told needed hearing aid.  Declined.   Follow.

## 2015-12-24 NOTE — Assessment & Plan Note (Signed)
On crestor.  Tolerating since starting CoQ 10.  Low cholesterol diet and exercise.   Follow lipid panel and liver function tests.

## 2015-12-24 NOTE — Assessment & Plan Note (Signed)
On prilosec.  Controlled.   

## 2015-12-24 NOTE — Assessment & Plan Note (Signed)
Blood pressure under good control.  Continue same medication regimen.  Follow pressures.  Follow metabolic panel.   

## 2015-12-24 NOTE — Assessment & Plan Note (Signed)
Was followed by Dr Kathyrn Sheriff.  She stopped allergy shots.  Medication not helping.  Refer back to ENT.  She wants to see Dr Pryor Ochoa.

## 2015-12-24 NOTE — Assessment & Plan Note (Signed)
On prozac.  Doing better.  Feels better.  Follow.

## 2015-12-24 NOTE — Assessment & Plan Note (Signed)
Intermittent flare.  Uses bactroban intermittently.  Have ENT evaluate.

## 2015-12-30 ENCOUNTER — Other Ambulatory Visit (INDEPENDENT_AMBULATORY_CARE_PROVIDER_SITE_OTHER): Payer: PPO

## 2015-12-30 DIAGNOSIS — H26491 Other secondary cataract, right eye: Secondary | ICD-10-CM | POA: Diagnosis not present

## 2015-12-30 DIAGNOSIS — E7800 Pure hypercholesterolemia, unspecified: Secondary | ICD-10-CM

## 2015-12-30 DIAGNOSIS — I10 Essential (primary) hypertension: Secondary | ICD-10-CM | POA: Diagnosis not present

## 2015-12-30 LAB — BASIC METABOLIC PANEL
BUN: 18 mg/dL (ref 6–23)
CHLORIDE: 104 meq/L (ref 96–112)
CO2: 32 meq/L (ref 19–32)
Calcium: 9.5 mg/dL (ref 8.4–10.5)
Creatinine, Ser: 0.95 mg/dL (ref 0.40–1.20)
GFR: 60.73 mL/min (ref >60.00)
GLUCOSE: 88 mg/dL (ref 70–99)
POTASSIUM: 3.8 meq/L (ref 3.5–5.1)
SODIUM: 140 meq/L (ref 135–145)

## 2015-12-30 LAB — LIPID PANEL
CHOL/HDL RATIO: 3
Cholesterol: 159 mg/dL (ref 0–200)
HDL: 59.2 mg/dL (ref >39.00)
LDL CALC: 79 mg/dL (ref 0–99)
NonHDL: 99.58
Triglycerides: 103 mg/dL (ref 0.0–149.0)
VLDL: 20.6 mg/dL (ref 0.0–40.0)

## 2015-12-30 LAB — HEPATIC FUNCTION PANEL
ALT: 13 U/L (ref 0–35)
AST: 20 U/L (ref 0–37)
Albumin: 4.3 g/dL (ref 3.5–5.2)
Alkaline Phosphatase: 66 U/L (ref 39–117)
BILIRUBIN DIRECT: 0.1 mg/dL (ref 0.0–0.3)
BILIRUBIN TOTAL: 0.4 mg/dL (ref 0.2–1.2)
Total Protein: 6.9 g/dL (ref 6.0–8.3)

## 2016-01-02 ENCOUNTER — Encounter: Payer: Self-pay | Admitting: *Deleted

## 2016-01-03 ENCOUNTER — Other Ambulatory Visit: Payer: Self-pay | Admitting: Internal Medicine

## 2016-01-04 DIAGNOSIS — M955 Acquired deformity of pelvis: Secondary | ICD-10-CM | POA: Diagnosis not present

## 2016-01-04 DIAGNOSIS — M9905 Segmental and somatic dysfunction of pelvic region: Secondary | ICD-10-CM | POA: Diagnosis not present

## 2016-01-04 DIAGNOSIS — M9903 Segmental and somatic dysfunction of lumbar region: Secondary | ICD-10-CM | POA: Diagnosis not present

## 2016-01-04 DIAGNOSIS — M5416 Radiculopathy, lumbar region: Secondary | ICD-10-CM | POA: Diagnosis not present

## 2016-01-17 DIAGNOSIS — H6060 Unspecified chronic otitis externa, unspecified ear: Secondary | ICD-10-CM | POA: Diagnosis not present

## 2016-01-17 DIAGNOSIS — J301 Allergic rhinitis due to pollen: Secondary | ICD-10-CM | POA: Diagnosis not present

## 2016-02-01 DIAGNOSIS — M9903 Segmental and somatic dysfunction of lumbar region: Secondary | ICD-10-CM | POA: Diagnosis not present

## 2016-02-01 DIAGNOSIS — M9905 Segmental and somatic dysfunction of pelvic region: Secondary | ICD-10-CM | POA: Diagnosis not present

## 2016-02-01 DIAGNOSIS — M955 Acquired deformity of pelvis: Secondary | ICD-10-CM | POA: Diagnosis not present

## 2016-02-01 DIAGNOSIS — M5416 Radiculopathy, lumbar region: Secondary | ICD-10-CM | POA: Diagnosis not present

## 2016-02-15 DIAGNOSIS — J301 Allergic rhinitis due to pollen: Secondary | ICD-10-CM | POA: Diagnosis not present

## 2016-02-15 DIAGNOSIS — K219 Gastro-esophageal reflux disease without esophagitis: Secondary | ICD-10-CM | POA: Diagnosis not present

## 2016-03-07 DIAGNOSIS — M955 Acquired deformity of pelvis: Secondary | ICD-10-CM | POA: Diagnosis not present

## 2016-03-07 DIAGNOSIS — M9905 Segmental and somatic dysfunction of pelvic region: Secondary | ICD-10-CM | POA: Diagnosis not present

## 2016-03-07 DIAGNOSIS — M9903 Segmental and somatic dysfunction of lumbar region: Secondary | ICD-10-CM | POA: Diagnosis not present

## 2016-03-07 DIAGNOSIS — M5416 Radiculopathy, lumbar region: Secondary | ICD-10-CM | POA: Diagnosis not present

## 2016-03-20 ENCOUNTER — Other Ambulatory Visit: Payer: Self-pay

## 2016-03-20 ENCOUNTER — Other Ambulatory Visit: Payer: Self-pay | Admitting: Internal Medicine

## 2016-03-20 NOTE — Telephone Encounter (Signed)
Last seen 12/23/15 last filled 10/26/15 #30 Patricia Pacheco

## 2016-03-21 MED ORDER — ALPRAZOLAM 0.25 MG PO TABS: 0.2500 mg | ORAL_TABLET | Freq: Every day | ORAL | 0 refills | Status: DC | PRN

## 2016-03-21 NOTE — Telephone Encounter (Signed)
faxed

## 2016-03-28 ENCOUNTER — Encounter: Payer: Self-pay | Admitting: Internal Medicine

## 2016-03-28 ENCOUNTER — Ambulatory Visit (INDEPENDENT_AMBULATORY_CARE_PROVIDER_SITE_OTHER): Payer: PPO | Admitting: Internal Medicine

## 2016-03-28 VITALS — BP 140/88 | HR 82 | Temp 98.6°F | Ht 61.0 in | Wt 130.6 lb

## 2016-03-28 DIAGNOSIS — I10 Essential (primary) hypertension: Secondary | ICD-10-CM

## 2016-03-28 DIAGNOSIS — M8580 Other specified disorders of bone density and structure, unspecified site: Secondary | ICD-10-CM

## 2016-03-28 DIAGNOSIS — E7800 Pure hypercholesterolemia, unspecified: Secondary | ICD-10-CM | POA: Diagnosis not present

## 2016-03-28 DIAGNOSIS — F419 Anxiety disorder, unspecified: Secondary | ICD-10-CM

## 2016-03-28 DIAGNOSIS — Z23 Encounter for immunization: Secondary | ICD-10-CM | POA: Diagnosis not present

## 2016-03-28 DIAGNOSIS — Z0000 Encounter for general adult medical examination without abnormal findings: Secondary | ICD-10-CM | POA: Diagnosis not present

## 2016-03-28 DIAGNOSIS — Z9109 Other allergy status, other than to drugs and biological substances: Secondary | ICD-10-CM

## 2016-03-28 DIAGNOSIS — Z1239 Encounter for other screening for malignant neoplasm of breast: Secondary | ICD-10-CM | POA: Diagnosis not present

## 2016-03-28 DIAGNOSIS — K219 Gastro-esophageal reflux disease without esophagitis: Secondary | ICD-10-CM

## 2016-03-28 MED ORDER — LISINOPRIL 40 MG PO TABS: 40.0000 mg | ORAL_TABLET | Freq: Every day | ORAL | 2 refills | Status: DC

## 2016-03-28 NOTE — Progress Notes (Signed)
Pre visit review using our clinic review tool, if applicable. No additional management support is needed unless otherwise documented below in the visit note. 

## 2016-03-28 NOTE — Assessment & Plan Note (Signed)
Physical today 03/28/16.  Mammogram 05/10/15 - Birads I.  Colonoscopy 10/08/13 - sigmoid diverticulosis otherwise normal.

## 2016-03-28 NOTE — Progress Notes (Signed)
Patient ID: Patricia Pacheco, female   DOB: [birth year], 75 y.o.   MRN: [id number]   Subjective:    Patient ID: Patricia Pacheco, female    DOB: 07-26-1940, 75 y.o.   MRN: [id number]  HPI  Patient with past history of CAD, GERD, allergies and asthma.  She comes in today to follow up on these issues as well as for a complete physical exam.  She is still having some allergy issues.  Has seen ENT (Dr Pryor Ochoa).  Taking 1/2 allegra, singulair and using nasal spray).  Also has tried reflux medication.  Still with some issues, but overall feels things are stable.  No chest pain. No sob.  No acid reflux.  No abdominal pain or cramping.  Bowels stable.     Past Medical History:  Diagnosis Date  . Allergy    allergy shots (Juengel)  . Arthritis   . Asthma   . CAD (coronary artery disease)    cath 11/27/04 - moderated LAD disease  . GERD (gastroesophageal reflux disease)    EGD (05/25/04) - slight presbyesophagus, mild esophagitis, mild gastritis, mild duodenitis - positive H. pylori.   Marland Kitchen Neuropathy [facility])    Past Surgical History:  Procedure Laterality Date  . APPENDECTOMY  1967   Family History  Problem Relation Age of Onset  . Arthritis Mother   . Colon polyps Sister    Social History   Social History  . Marital status: Married    Spouse name: N/A  . Number of children: N/A  . Years of education: N/A   Social History Main Topics  . Smoking status: Never Smoker  . Smokeless tobacco: Never Used  . Alcohol use No  . Drug use: No  . Sexual activity: Not Asked   Other Topics Concern  . None   Social History Narrative  . None    Outpatient Encounter Prescriptions as of 03/28/2016  Medication Sig  . ALPRAZolam (XANAX) 0.25 MG tablet Take 1 tablet (0.25 mg total) by mouth daily as needed. for anxiety  . aspirin 81 MG tablet Take 81 mg by mouth daily.  . Calcium Carbonate-Vit D-Min (CALCIUM 1200 PO) Take by mouth daily.  . celecoxib (CELEBREX) 200 MG capsule Take 1 capsule (200 mg total) by  mouth daily as needed.  . CHLORPHENIRAMINE-PHENYLEPHRINE PO Take 1 tablet by mouth every 4 (four) hours as needed.  . COD LIVER OIL PO Take by mouth.  . Coenzyme Q10 (CO Q-10) 100 MG CAPS Take 1 capsule by mouth daily.  Marland Kitchen docusate sodium (COLACE) 100 MG capsule Take by mouth.  . fexofenadine (ALLEGRA) 180 MG tablet Take 180 mg by mouth daily.  . fluocinolone (SYNALAR) 0.01 % external solution Apply topically 2 (two) times daily.  Marland Kitchen FLUoxetine (PROZAC) 10 MG capsule TAKE 1 CAPSULE EVERY DAY  . Multiple Vitamin (MULTIVITAMIN) tablet Take 1 tablet by mouth daily.  . mupirocin ointment (BACTROBAN) 2 % Place 1 application into the nose 2 (two) times daily.  . Naproxen Sodium (ALEVE PO) Take by mouth as needed.  . Omega-3 Fatty Acids (FISH OIL PO) Take by mouth daily.  Marland Kitchen omeprazole (PRILOSEC) 40 MG capsule TAKE 1 CAPSULE EVERY DAY  . pseudoephedrine (SUDAFED) 60 MG tablet Take 60 mg by mouth every 4 (four) hours as needed for congestion. Reported on 12/23/2015  . rosuvastatin (CRESTOR) 5 MG tablet TAKE 1 TABLET BY MOUTH EVERY MONDAY WEDNESDAY AND FRIDAY  . silver sulfADIAZINE (SILVADENE) 1 % cream Apply 1 application topically daily.  Marland Kitchen  triamcinolone (KENALOG) 0.025 % cream Apply 1 application topically 2 (two) times daily.  Marland Kitchen UNABLE TO FIND   . [DISCONTINUED] lisinopril (PRINIVIL,ZESTRIL) 30 MG tablet TAKE 1 TABLET BY MOUTH DAILY  . lisinopril (PRINIVIL,ZESTRIL) 40 MG tablet Take 1 tablet (40 mg total) by mouth daily.   No facility-administered encounter medications on file as of 03/28/2016.     Review of Systems  Constitutional: Negative for appetite change, fatigue and unexpected weight change.  HENT: Positive for congestion and postnasal drip.   Eyes: Negative for pain and visual disturbance.  Respiratory: Negative for cough, chest tightness and shortness of breath.   Cardiovascular: Negative for chest pain, palpitations and leg swelling.  Gastrointestinal: Negative for abdominal pain,  constipation, diarrhea, nausea and vomiting.  Genitourinary: Negative for difficulty urinating, dysuria and frequency.  Musculoskeletal: Negative for back pain and joint swelling.  Skin: Negative for color change and rash.  Neurological: Negative for dizziness, light-headedness and headaches.  Hematological: Negative for adenopathy. Does not bruise/bleed easily.  Psychiatric/Behavioral: Negative for agitation and dysphoric mood.       Objective:    Physical Exam  Constitutional: She is oriented to person, place, and time. She appears well-developed and well-nourished. No distress.  HENT:  Nose: Nose normal.  Mouth/Throat: Oropharynx is clear and moist.  Eyes: Right eye exhibits no discharge. Left eye exhibits no discharge. No scleral icterus.  Neck: Neck supple. No thyromegaly present.  Cardiovascular: Normal rate and regular rhythm.   Pulmonary/Chest: Breath sounds normal. No accessory muscle usage. No tachypnea. No respiratory distress. She has no decreased breath sounds. She has no wheezes. She has no rhonchi. Right breast exhibits no inverted nipple, no mass, no nipple discharge and no tenderness (no axillary adenopathy). Left breast exhibits no inverted nipple, no mass, no nipple discharge and no tenderness (no axilarry adenopathy).  Abdominal: Soft. Bowel sounds are normal. There is no tenderness.  Musculoskeletal: She exhibits no edema or tenderness.  Lymphadenopathy:    She has no cervical adenopathy.  Neurological: She is alert and oriented to person, place, and time.  Skin: Skin is warm. No rash noted. No erythema.  Psychiatric: She has a normal mood and affect. Her behavior is normal.    BP 140/88   Pulse 82   Temp 98.6 F (37 C) (Oral)   Ht 5\' 1"  (1.549 m)   Wt 130 lb 9.6 oz (59.2 kg)   LMP 08/29/1995   SpO2 95%   BMI 24.68 kg/m  Wt Readings from Last 3 Encounters:  03/28/16 130 lb 9.6 oz (59.2 kg)  12/23/15 128 lb 12 oz (58.4 kg)  08/23/15 127 lb 8 oz (57.8 kg)      Lab Results  Component Value Date   WBC 4.9 07/15/2015   HGB 12.3 07/15/2015   HCT 37.6 07/15/2015   PLT 186.0 07/15/2015   GLUCOSE 88 12/30/2015   CHOL 159 12/30/2015   TRIG 103.0 12/30/2015   HDL 59.20 12/30/2015   LDLDIRECT 115.0 09/04/2012   LDLCALC 79 12/30/2015   ALT 13 12/30/2015   AST 20 12/30/2015   NA 140 12/30/2015   K 3.8 12/30/2015   CL 104 12/30/2015   CREATININE 0.95 12/30/2015   BUN 18 12/30/2015   CO2 32 12/30/2015   TSH 1.16 05/25/2015    Mm Digital Screening Bilateral  Result Date: 05/10/2015 CLINICAL DATA:  Screening. EXAM: DIGITAL SCREENING BILATERAL MAMMOGRAM WITH CAD COMPARISON:  None. ACR Breast Density Category b: There are scattered areas of fibroglandular density. FINDINGS: There  are no findings suspicious for malignancy. Images were processed with CAD. IMPRESSION: No mammographic evidence of malignancy. A result letter of this screening mammogram will be mailed directly to the patient. RECOMMENDATION: Screening mammogram in one year. (Code:SM-B-01Y) BI-RADS CATEGORY  1: Negative. Electronically Signed   By: Lajean Manes M.D.   On: 05/10/2015 08:31       Assessment & Plan:   Problem List Items Addressed This Visit    Anxiety    On prozac.  Stable.       Environmental allergies    Evaluated by Dr Pryor Ochoa.  On a good regimen. Feels stable.  Follow.       Relevant Orders   CBC with Differential/Platelet   Basic metabolic panel   TSH   Essential hypertension    Blood pressure as outlined.  Have her spot check her pressure. Get her back in to reassess.  Follow.        Relevant Medications   lisinopril (PRINIVIL,ZESTRIL) 40 MG tablet   GERD (gastroesophageal reflux disease)    On prilosec.  Controlled.       Health care maintenance    Physical today 03/28/16.  Mammogram 05/10/15 - Birads I.  Colonoscopy 10/08/13 - sigmoid diverticulosis otherwise normal.        Osteopenia    Continue vitamin d and weight bearing exercise.  Follow.        Relevant Orders   VITAMIN D 25 Hydroxy (Vit-D Deficiency, Fractures)   Pure hypercholesterolemia    On crestor.  Low cholesterol diet and exercise. Follow  Lipid panel and liver function tests.        Relevant Medications   lisinopril (PRINIVIL,ZESTRIL) 40 MG tablet   Other Relevant Orders   Hepatic function panel   Lipid panel    Other Visit Diagnoses    Encounter for breast cancer screening other than mammogram    -  Primary   Relevant Orders   MM Digital Screening   Encounter for immunization       Relevant Orders   Flu vaccine HIGH DOSE PF (Completed)       Einar Pheasant, MD

## 2016-04-03 ENCOUNTER — Encounter: Payer: Self-pay | Admitting: Internal Medicine

## 2016-04-09 ENCOUNTER — Encounter: Payer: Self-pay | Admitting: Internal Medicine

## 2016-04-09 NOTE — Assessment & Plan Note (Signed)
Evaluated by Dr Pryor Ochoa.  On a good regimen. Feels stable.  Follow.

## 2016-04-09 NOTE — Assessment & Plan Note (Signed)
Continue vitamin d and weight bearing exercise.  Follow.

## 2016-04-09 NOTE — Assessment & Plan Note (Signed)
On prilosec.  Controlled.   

## 2016-04-09 NOTE — Assessment & Plan Note (Signed)
On crestor.  Low cholesterol diet and exercise.  Follow  Lipid panel and liver function tests.   

## 2016-04-09 NOTE — Assessment & Plan Note (Signed)
Blood pressure as outlined.  Have her spot check her pressure. Get her back in to reassess.  Follow.

## 2016-04-09 NOTE — Assessment & Plan Note (Signed)
On prozac.  Stable.   

## 2016-04-11 DIAGNOSIS — M9903 Segmental and somatic dysfunction of lumbar region: Secondary | ICD-10-CM | POA: Diagnosis not present

## 2016-04-11 DIAGNOSIS — M955 Acquired deformity of pelvis: Secondary | ICD-10-CM | POA: Diagnosis not present

## 2016-04-11 DIAGNOSIS — M9905 Segmental and somatic dysfunction of pelvic region: Secondary | ICD-10-CM | POA: Diagnosis not present

## 2016-04-11 DIAGNOSIS — M5416 Radiculopathy, lumbar region: Secondary | ICD-10-CM | POA: Diagnosis not present

## 2016-05-09 ENCOUNTER — Ambulatory Visit
Admission: RE | Admit: 2016-05-09 | Discharge: 2016-05-09 | Disposition: A | Discharge status: Home / Self Care | Payer: PPO | Source: Ambulatory Visit | Attending: Internal Medicine | Admitting: Internal Medicine

## 2016-05-09 DIAGNOSIS — M955 Acquired deformity of pelvis: Secondary | ICD-10-CM | POA: Diagnosis not present

## 2016-05-09 DIAGNOSIS — Z1239 Encounter for other screening for malignant neoplasm of breast: Secondary | ICD-10-CM | POA: Diagnosis not present

## 2016-05-09 DIAGNOSIS — M5416 Radiculopathy, lumbar region: Secondary | ICD-10-CM | POA: Diagnosis not present

## 2016-05-09 DIAGNOSIS — Z1231 Encounter for screening mammogram for malignant neoplasm of breast: Secondary | ICD-10-CM | POA: Insufficient documentation

## 2016-05-09 DIAGNOSIS — M9905 Segmental and somatic dysfunction of pelvic region: Secondary | ICD-10-CM | POA: Diagnosis not present

## 2016-05-09 DIAGNOSIS — M9903 Segmental and somatic dysfunction of lumbar region: Secondary | ICD-10-CM | POA: Diagnosis not present

## 2016-05-09 HISTORY — DX: Malignant (primary) neoplasm, unspecified: C80.1

## 2016-05-21 ENCOUNTER — Other Ambulatory Visit (INDEPENDENT_AMBULATORY_CARE_PROVIDER_SITE_OTHER): Payer: PPO

## 2016-05-21 DIAGNOSIS — Z9109 Other allergy status, other than to drugs and biological substances: Secondary | ICD-10-CM | POA: Diagnosis not present

## 2016-05-21 DIAGNOSIS — E7800 Pure hypercholesterolemia, unspecified: Secondary | ICD-10-CM | POA: Diagnosis not present

## 2016-05-21 LAB — LIPID PANEL
CHOL/HDL RATIO: 3
Cholesterol: 184 mg/dL (ref 0–200)
HDL: 67.3 mg/dL (ref >39.00)
LDL Cholesterol: 101 mg/dL — ABNORMAL HIGH (ref 0–99)
NONHDL: 116.38
Triglycerides: 75 mg/dL (ref 0.0–149.0)
VLDL: 15 mg/dL (ref 0.0–40.0)

## 2016-05-21 LAB — CBC WITH DIFFERENTIAL/PLATELET
BASOS PCT: 0.6 % (ref 0.0–3.0)
Basophils Absolute: 0 10*3/uL (ref 0.0–0.1)
EOS ABS: 0.2 10*3/uL (ref 0.0–0.7)
Eosinophils Relative: 4.8 % (ref 0.0–5.0)
HEMATOCRIT: 35.2 % — AB (ref 36.0–46.0)
HEMOGLOBIN: 11.6 g/dL — AB (ref 12.0–15.0)
Lymphocytes Relative: 39.9 % (ref 12.0–46.0)
Lymphs Abs: 1.8 10*3/uL (ref 0.7–4.0)
MCHC: 33 g/dL (ref 30.0–36.0)
MCV: 90.1 fl (ref 78.0–100.0)
Monocytes Absolute: 0.3 10*3/uL (ref 0.1–1.0)
Monocytes Relative: 6.4 % (ref 3.0–12.0)
Neutro Abs: 2.2 10*3/uL (ref 1.4–7.7)
Neutrophils Relative %: 48.3 % (ref 43.0–77.0)
Platelets: 195 10*3/uL (ref 150.0–400.0)
RBC: 3.91 Mil/uL (ref 3.87–5.11)
RDW: 13.8 % (ref 11.5–15.5)
WBC: 4.5 10*3/uL (ref 4.0–10.5)

## 2016-05-21 LAB — HEPATIC FUNCTION PANEL
ALK PHOS: 72 U/L (ref 39–117)
ALT: 12 U/L (ref 0–35)
AST: 16 U/L (ref 0–37)
Albumin: 4.3 g/dL (ref 3.5–5.2)
BILIRUBIN DIRECT: 0.1 mg/dL (ref 0.0–0.3)
BILIRUBIN TOTAL: 0.4 mg/dL (ref 0.2–1.2)
TOTAL PROTEIN: 6.7 g/dL (ref 6.0–8.3)

## 2016-05-21 LAB — BASIC METABOLIC PANEL
BUN: 14 mg/dL (ref 6–23)
CO2: 31 mEq/L (ref 19–32)
CREATININE: 0.85 mg/dL (ref 0.40–1.20)
Calcium: 9.3 mg/dL (ref 8.4–10.5)
Chloride: 106 mEq/L (ref 96–112)
GFR: 68.98 mL/min (ref >60.00)
GLUCOSE: 88 mg/dL (ref 70–99)
Potassium: 4.1 mEq/L (ref 3.5–5.1)
Sodium: 143 mEq/L (ref 135–145)

## 2016-05-21 LAB — TSH: TSH: 1.39 u[IU]/mL (ref 0.35–4.50)

## 2016-05-22 ENCOUNTER — Other Ambulatory Visit: Payer: Self-pay | Admitting: Internal Medicine

## 2016-05-22 ENCOUNTER — Other Ambulatory Visit (INDEPENDENT_AMBULATORY_CARE_PROVIDER_SITE_OTHER): Payer: PPO

## 2016-05-22 DIAGNOSIS — D649 Anemia, unspecified: Secondary | ICD-10-CM | POA: Diagnosis not present

## 2016-05-22 LAB — VITAMIN B12: Vitamin B-12: 491 pg/mL (ref 211–911)

## 2016-05-22 LAB — FERRITIN: Ferritin: 37 ng/mL (ref 10.0–291.0)

## 2016-05-22 LAB — IBC PANEL
IRON: 77 ug/dL (ref 42–145)
Saturation Ratios: 25.5 % (ref 20.0–50.0)
Transferrin: 216 mg/dL (ref 212.0–360.0)

## 2016-05-22 NOTE — Progress Notes (Signed)
Orders placed for add on labs.  

## 2016-05-23 ENCOUNTER — Encounter: Payer: Self-pay | Admitting: Internal Medicine

## 2016-05-23 ENCOUNTER — Ambulatory Visit (INDEPENDENT_AMBULATORY_CARE_PROVIDER_SITE_OTHER): Payer: PPO | Admitting: Internal Medicine

## 2016-05-23 VITALS — BP 126/82 | HR 80 | Temp 97.6°F | Resp 16 | Wt 132.0 lb

## 2016-05-23 DIAGNOSIS — I10 Essential (primary) hypertension: Secondary | ICD-10-CM | POA: Diagnosis not present

## 2016-05-23 DIAGNOSIS — Z9109 Other allergy status, other than to drugs and biological substances: Secondary | ICD-10-CM

## 2016-05-23 DIAGNOSIS — F419 Anxiety disorder, unspecified: Secondary | ICD-10-CM

## 2016-05-23 DIAGNOSIS — D649 Anemia, unspecified: Secondary | ICD-10-CM

## 2016-05-23 DIAGNOSIS — K219 Gastro-esophageal reflux disease without esophagitis: Secondary | ICD-10-CM | POA: Diagnosis not present

## 2016-05-23 DIAGNOSIS — E7800 Pure hypercholesterolemia, unspecified: Secondary | ICD-10-CM

## 2016-05-23 MED ORDER — ALPRAZOLAM 0.25 MG PO TABS: 0.2500 mg | ORAL_TABLET | Freq: Every day | ORAL | 0 refills | Status: DC | PRN

## 2016-05-23 NOTE — Progress Notes (Signed)
Patient ID: Patricia Pacheco, female   DOB: 1941-02-05, 75 y.o.   MRN: [id number]   Subjective:    Patient ID: Patricia Pacheco, female    DOB: 11-Apr-1941, 75 y.o.   MRN: [id number]  HPI  Patient here for a scheduled follow up.  States she is doing relatively well.  Stays active.  Still with allergy issues. Has tried multiple medications and seen ENT with no significant improvement.  No chest pain.  No sob.  Having aching with crestor.  Stopped. Feels better.  No nausea or vomiting.  Bowels stable.    Past Medical History:  Diagnosis Date  . Allergy    allergy shots ([facility])  . Arthritis   . Asthma   . CAD (coronary artery disease)    cath 11/27/04 - moderated LAD disease  . Cancer ([place])    skin  . GERD (gastroesophageal reflux disease)    EGD (05/25/04) - slight presbyesophagus, mild esophagitis, mild gastritis, mild duodenitis - positive H. pylori.   Marland Kitchen Neuropathy [facility])    Past Surgical History:  Procedure Laterality Date  . APPENDECTOMY  1967   Family History  Problem Relation Age of Onset  . Arthritis Mother   . Colon polyps Sister    Social History   Social History  . Marital status: Married    Spouse name: N/A  . Number of children: N/A  . Years of education: N/A   Social History Main Topics  . Smoking status: Never Smoker  . Smokeless tobacco: Never Used  . Alcohol use No  . Drug use: No  . Sexual activity: Not Asked   Other Topics Concern  . None   Social History Narrative  . None    Outpatient Encounter Prescriptions as of 05/23/2016  Medication Sig  . ALPRAZolam (XANAX) 0.25 MG tablet Take 1 tablet (0.25 mg total) by mouth daily as needed. for anxiety  . aspirin 81 MG tablet Take 81 mg by mouth daily.  . Calcium Carbonate-Vit D-Min (CALCIUM 1200 PO) Take by mouth daily.  . CHLORPHENIRAMINE-PHENYLEPHRINE PO Take 1 tablet by mouth every 4 (four) hours as needed.  . COD LIVER OIL PO Take by mouth.  . Coenzyme Q10 (CO Q-10) 100 MG CAPS Take 1 capsule by mouth  daily.  Marland Kitchen docusate sodium (COLACE) 100 MG capsule Take by mouth.  . fexofenadine (ALLEGRA) 180 MG tablet Take 180 mg by mouth daily.  . fluocinolone (SYNALAR) 0.01 % external solution Apply topically 2 (two) times daily.  Marland Kitchen FLUoxetine (PROZAC) 10 MG capsule TAKE 1 CAPSULE EVERY DAY  . lisinopril (PRINIVIL,ZESTRIL) 40 MG tablet Take 1 tablet (40 mg total) by mouth daily.  . Multiple Vitamin (MULTIVITAMIN) tablet Take 1 tablet by mouth daily.  . mupirocin ointment (BACTROBAN) 2 % Place 1 application into the nose 2 (two) times daily.  . Naproxen Sodium (ALEVE PO) Take by mouth as needed.  . Omega-3 Fatty Acids (FISH OIL PO) Take by mouth daily.  Marland Kitchen omeprazole (PRILOSEC) 40 MG capsule TAKE 1 CAPSULE EVERY DAY  . silver sulfADIAZINE (SILVADENE) 1 % cream Apply 1 application topically daily.  Marland Kitchen triamcinolone (KENALOG) 0.025 % cream Apply 1 application topically 2 (two) times daily.  . [DISCONTINUED] ALPRAZolam (XANAX) 0.25 MG tablet Take 1 tablet (0.25 mg total) by mouth daily as needed. for anxiety  . pseudoephedrine (SUDAFED) 60 MG tablet Take 60 mg by mouth every 4 (four) hours as needed for congestion. Reported on 12/23/2015  . UNABLE TO FIND   . [  DISCONTINUED] celecoxib (CELEBREX) 200 MG capsule Take 1 capsule (200 mg total) by mouth daily as needed. (Patient not taking: Reported on 05/23/2016)  . [DISCONTINUED] rosuvastatin (CRESTOR) 5 MG tablet TAKE 1 TABLET BY MOUTH EVERY MONDAY WEDNESDAY AND FRIDAY (Patient not taking: Reported on 05/23/2016)   No facility-administered encounter medications on file as of 05/23/2016.     Review of Systems  Constitutional: Negative for appetite change and unexpected weight change.  HENT: Negative for sinus pressure.        Some persistent allergy symptoms.   Respiratory: Negative for cough, chest tightness and shortness of breath.   Cardiovascular: Negative for chest pain, palpitations and leg swelling.  Gastrointestinal: Negative for abdominal pain,  diarrhea, nausea and vomiting.  Genitourinary: Negative for difficulty urinating and dysuria.  Musculoskeletal:       Some aching with crestor.  Is better since stopping.    Skin: Negative for color change and rash.  Neurological: Negative for dizziness, light-headedness and headaches.  Psychiatric/Behavioral: Negative for agitation and dysphoric mood.       Objective:    Physical Exam  Constitutional: She appears well-developed and well-nourished. No distress.  HENT:  Nose: Nose normal.  Mouth/Throat: Oropharynx is clear and moist.  Neck: Neck supple. No thyromegaly present.  Cardiovascular: Normal rate and regular rhythm.   Pulmonary/Chest: Breath sounds normal. No respiratory distress. She has no wheezes.  Abdominal: Soft. Bowel sounds are normal. There is no tenderness.  Musculoskeletal: She exhibits no edema or tenderness.  Lymphadenopathy:    She has no cervical adenopathy.  Skin: No rash noted. No erythema.  Psychiatric: She has a normal mood and affect. Her behavior is normal.    BP 126/82 (BP Location: Left Arm, Patient Position: Sitting, Cuff Size: Normal)   Pulse 80   Temp 97.6 F (36.4 C) (Oral)   Resp 16   Wt 132 lb (59.9 kg)   LMP 08/29/1995   SpO2 97%   BMI 24.94 kg/m  Wt Readings from Last 3 Encounters:  05/23/16 132 lb (59.9 kg)  03/28/16 130 lb 9.6 oz (59.2 kg)  12/23/15 128 lb 12 oz (58.4 kg)     Lab Results  Component Value Date   WBC 4.5 05/21/2016   HGB 11.6 (L) 05/21/2016   HCT 35.2 (L) 05/21/2016   PLT 195.0 05/21/2016   GLUCOSE 88 05/21/2016   CHOL 184 05/21/2016   TRIG 75.0 05/21/2016   HDL 67.30 05/21/2016   LDLDIRECT 115.0 09/04/2012   LDLCALC 101 (H) 05/21/2016   ALT 12 05/21/2016   AST 16 05/21/2016   NA 143 05/21/2016   K 4.1 05/21/2016   CL 106 05/21/2016   CREATININE 0.85 05/21/2016   BUN 14 05/21/2016   CO2 31 05/21/2016   TSH 1.39 05/21/2016    Mm Digital Screening  Result Date: 05/09/2016 CLINICAL DATA:   Screening. EXAM: DIGITAL SCREENING BILATERAL MAMMOGRAM WITH CAD COMPARISON:  Previous exam(s). ACR Breast Density Category b: There are scattered areas of fibroglandular density. FINDINGS: There are no findings suspicious for malignancy. Images were processed with CAD. IMPRESSION: No mammographic evidence of malignancy. A result letter of this screening mammogram will be mailed directly to the patient. RECOMMENDATION: Screening mammogram in one year. (Code:SM-B-01Y) BI-RADS CATEGORY  1: Negative. Electronically Signed   By: Franki Cabot M.D.   On: 05/09/2016 16:34       Assessment & Plan:   Problem List Items Addressed This Visit    Anxiety    Stable on prozac.  Follow.       Relevant Medications   ALPRAZolam (XANAX) 0.25 MG tablet   Environmental allergies    Has been evaluated by ENT (Dr Pryor Ochoa).  Still with issues, but stable.  Follow.        Essential hypertension    Blood pressure under good control.  Continue same medication regimen.  Follow pressures.  Follow metabolic panel.        Relevant Orders   Basic metabolic panel   GERD (gastroesophageal reflux disease)    On prilosec.  Stable.       Pure hypercholesterolemia    Off crestor now.  Caused aching.  Feels better.  Low cholesterol diet and exercise.  Follow lipid panel.  Recent LDL 101.       Relevant Orders   Hepatic function panel   Lipid panel    Other Visit Diagnoses    Anemia, unspecified type    -  Primary   Relevant Orders   CBC with Differential/Platelet   Ferritin       Einar Pheasant, MD

## 2016-05-23 NOTE — Progress Notes (Signed)
Pre visit review using our clinic review tool, if applicable. No additional management support is needed unless otherwise documented below in the visit note. 

## 2016-05-28 ENCOUNTER — Encounter: Payer: Self-pay | Admitting: Internal Medicine

## 2016-05-28 NOTE — Assessment & Plan Note (Signed)
Stable on prozac.  Follow.   

## 2016-05-28 NOTE — Assessment & Plan Note (Signed)
On prilosec.  Stable.

## 2016-05-28 NOTE — Assessment & Plan Note (Signed)
Blood pressure under good control.  Continue same medication regimen.  Follow pressures.  Follow metabolic panel.   

## 2016-05-28 NOTE — Assessment & Plan Note (Signed)
Has been evaluated by ENT (Dr Pryor Ochoa).  Still with issues, but stable.  Follow.

## 2016-05-28 NOTE — Assessment & Plan Note (Signed)
Off crestor now.  Caused aching.  Feels better.  Low cholesterol diet and exercise.  Follow lipid panel.  Recent LDL 101.

## 2016-06-11 ENCOUNTER — Other Ambulatory Visit: Payer: Self-pay | Admitting: Internal Medicine

## 2016-06-13 DIAGNOSIS — M5416 Radiculopathy, lumbar region: Secondary | ICD-10-CM | POA: Diagnosis not present

## 2016-06-13 DIAGNOSIS — M9905 Segmental and somatic dysfunction of pelvic region: Secondary | ICD-10-CM | POA: Diagnosis not present

## 2016-06-13 DIAGNOSIS — M9903 Segmental and somatic dysfunction of lumbar region: Secondary | ICD-10-CM | POA: Diagnosis not present

## 2016-06-13 DIAGNOSIS — M955 Acquired deformity of pelvis: Secondary | ICD-10-CM | POA: Diagnosis not present

## 2016-07-03 ENCOUNTER — Telehealth: Payer: Self-pay | Admitting: Internal Medicine

## 2016-07-03 DIAGNOSIS — J0101 Acute recurrent maxillary sinusitis: Secondary | ICD-10-CM | POA: Diagnosis not present

## 2016-07-03 NOTE — Telephone Encounter (Signed)
Pt called and stated that she believes that she has a sinus infection. She has a really sore throat, teeth and right side of her face is hurting, she is also complaining of congestion. Please advise, thank you!  Call pt @ [phone]

## 2016-07-03 NOTE — Telephone Encounter (Signed)
If her face/eye is swelling - agree with evaluation.  If sinus congestion and no swelling (or eye pulling), then saline nasal spray - flush nose at least 2-3x/day.  If able to take mucinex/robitussin would recommend mucinex in the am and robitussin in the pm.  If persistent problems and wants evaluation, I can work her in during lunch Thursday - 12:15.

## 2016-07-03 NOTE — Telephone Encounter (Signed)
Reason for call:sinus infection Symptoms: right sided sore throat,  right sided facial pain  Right eye pulled due to swelling she thinks of sinuses  , no fever , headache, no numbness,  Duration three days Medications:Allegra , Tylenol Last seen for this problem: Seen by: Urged patient to be evaluated by [facility] Please advise

## 2016-07-03 NOTE — Telephone Encounter (Signed)
Patient advised she is going to [facility] in Clinic for evaluation.

## 2016-07-04 NOTE — Telephone Encounter (Signed)
Please call and f/u and make sure pt evaluated.

## 2016-07-04 NOTE — Telephone Encounter (Signed)
Spoke with patient states she was prescribed Benzonatate and Doxycycline by [facility] in Clinic.   Patient states she is feeling some better.

## 2016-07-18 DIAGNOSIS — M5416 Radiculopathy, lumbar region: Secondary | ICD-10-CM | POA: Diagnosis not present

## 2016-07-18 DIAGNOSIS — M9905 Segmental and somatic dysfunction of pelvic region: Secondary | ICD-10-CM | POA: Diagnosis not present

## 2016-07-18 DIAGNOSIS — M9903 Segmental and somatic dysfunction of lumbar region: Secondary | ICD-10-CM | POA: Diagnosis not present

## 2016-07-18 DIAGNOSIS — M955 Acquired deformity of pelvis: Secondary | ICD-10-CM | POA: Diagnosis not present

## 2016-08-01 DIAGNOSIS — M9905 Segmental and somatic dysfunction of pelvic region: Secondary | ICD-10-CM | POA: Diagnosis not present

## 2016-08-01 DIAGNOSIS — M9903 Segmental and somatic dysfunction of lumbar region: Secondary | ICD-10-CM | POA: Diagnosis not present

## 2016-08-01 DIAGNOSIS — M5416 Radiculopathy, lumbar region: Secondary | ICD-10-CM | POA: Diagnosis not present

## 2016-08-01 DIAGNOSIS — M955 Acquired deformity of pelvis: Secondary | ICD-10-CM | POA: Diagnosis not present

## 2016-08-15 DIAGNOSIS — M9905 Segmental and somatic dysfunction of pelvic region: Secondary | ICD-10-CM | POA: Diagnosis not present

## 2016-08-15 DIAGNOSIS — M5416 Radiculopathy, lumbar region: Secondary | ICD-10-CM | POA: Diagnosis not present

## 2016-08-15 DIAGNOSIS — M955 Acquired deformity of pelvis: Secondary | ICD-10-CM | POA: Diagnosis not present

## 2016-08-15 DIAGNOSIS — M9903 Segmental and somatic dysfunction of lumbar region: Secondary | ICD-10-CM | POA: Diagnosis not present

## 2016-08-17 DIAGNOSIS — J0100 Acute maxillary sinusitis, unspecified: Secondary | ICD-10-CM | POA: Diagnosis not present

## 2016-08-17 DIAGNOSIS — J301 Allergic rhinitis due to pollen: Secondary | ICD-10-CM | POA: Diagnosis not present

## 2016-08-17 DIAGNOSIS — R05 Cough: Secondary | ICD-10-CM | POA: Diagnosis not present

## 2016-08-29 ENCOUNTER — Telehealth: Payer: Self-pay | Admitting: Internal Medicine

## 2016-08-29 NOTE — Telephone Encounter (Signed)
Left pt message asking to call Allison back directly at [phone] to schedule AWV. Thanks! °

## 2016-08-30 ENCOUNTER — Other Ambulatory Visit: Payer: Self-pay | Admitting: Internal Medicine

## 2016-09-04 DIAGNOSIS — M9903 Segmental and somatic dysfunction of lumbar region: Secondary | ICD-10-CM | POA: Diagnosis not present

## 2016-09-04 DIAGNOSIS — M955 Acquired deformity of pelvis: Secondary | ICD-10-CM | POA: Diagnosis not present

## 2016-09-04 DIAGNOSIS — M9905 Segmental and somatic dysfunction of pelvic region: Secondary | ICD-10-CM | POA: Diagnosis not present

## 2016-09-04 DIAGNOSIS — M5416 Radiculopathy, lumbar region: Secondary | ICD-10-CM | POA: Diagnosis not present

## 2016-09-19 ENCOUNTER — Other Ambulatory Visit (INDEPENDENT_AMBULATORY_CARE_PROVIDER_SITE_OTHER): Payer: PPO

## 2016-09-19 DIAGNOSIS — D649 Anemia, unspecified: Secondary | ICD-10-CM

## 2016-09-19 DIAGNOSIS — E7800 Pure hypercholesterolemia, unspecified: Secondary | ICD-10-CM | POA: Diagnosis not present

## 2016-09-19 DIAGNOSIS — I10 Essential (primary) hypertension: Secondary | ICD-10-CM

## 2016-09-19 LAB — CBC WITH DIFFERENTIAL/PLATELET
Basophils Absolute: 0 10*3/uL (ref 0.0–0.1)
Basophils Relative: 0.9 % (ref 0.0–3.0)
EOS ABS: 0.2 10*3/uL (ref 0.0–0.7)
Eosinophils Relative: 4.3 % (ref 0.0–5.0)
HEMATOCRIT: 36.2 % (ref 36.0–46.0)
HEMOGLOBIN: 12 g/dL (ref 12.0–15.0)
LYMPHS PCT: 30.7 % (ref 12.0–46.0)
Lymphs Abs: 1.5 10*3/uL (ref 0.7–4.0)
MCHC: 33.1 g/dL (ref 30.0–36.0)
MCV: 90.1 fl (ref 78.0–100.0)
MONO ABS: 0.5 10*3/uL (ref 0.1–1.0)
Monocytes Relative: 9.8 % (ref 3.0–12.0)
Neutro Abs: 2.7 10*3/uL (ref 1.4–7.7)
Neutrophils Relative %: 54.3 % (ref 43.0–77.0)
Platelets: 195 10*3/uL (ref 150.0–400.0)
RBC: 4.02 Mil/uL (ref 3.87–5.11)
RDW: 13.5 % (ref 11.5–15.5)
WBC: 5 10*3/uL (ref 4.0–10.5)

## 2016-09-19 LAB — BASIC METABOLIC PANEL
BUN: 25 mg/dL — ABNORMAL HIGH (ref 6–23)
CALCIUM: 10.3 mg/dL (ref 8.4–10.5)
CO2: 32 mEq/L (ref 19–32)
Chloride: 103 mEq/L (ref 96–112)
Creatinine, Ser: 0.98 mg/dL (ref 0.40–1.20)
GFR: 58.48 mL/min — AB (ref >60.00)
GLUCOSE: 101 mg/dL — AB (ref 70–99)
Potassium: 4.2 mEq/L (ref 3.5–5.1)
Sodium: 141 mEq/L (ref 135–145)

## 2016-09-19 LAB — HEPATIC FUNCTION PANEL
ALK PHOS: 93 U/L (ref 39–117)
ALT: 15 U/L (ref 0–35)
AST: 21 U/L (ref 0–37)
Albumin: 4.3 g/dL (ref 3.5–5.2)
BILIRUBIN DIRECT: 0.1 mg/dL (ref 0.0–0.3)
BILIRUBIN TOTAL: 0.3 mg/dL (ref 0.2–1.2)
Total Protein: 6.7 g/dL (ref 6.0–8.3)

## 2016-09-19 LAB — FERRITIN: FERRITIN: 33.1 ng/mL (ref 10.0–291.0)

## 2016-09-19 LAB — LIPID PANEL
CHOL/HDL RATIO: 3
Cholesterol: 207 mg/dL — ABNORMAL HIGH (ref 0–200)
HDL: 62.3 mg/dL (ref >39.00)
LDL CALC: 125 mg/dL — AB (ref 0–99)
NONHDL: 145.06
Triglycerides: 102 mg/dL (ref 0.0–149.0)
VLDL: 20.4 mg/dL (ref 0.0–40.0)

## 2016-09-25 ENCOUNTER — Other Ambulatory Visit: Payer: Self-pay | Admitting: Internal Medicine

## 2016-09-25 ENCOUNTER — Encounter: Payer: Self-pay | Admitting: Internal Medicine

## 2016-09-25 ENCOUNTER — Ambulatory Visit (INDEPENDENT_AMBULATORY_CARE_PROVIDER_SITE_OTHER): Payer: PPO | Admitting: Internal Medicine

## 2016-09-25 ENCOUNTER — Ambulatory Visit (INDEPENDENT_AMBULATORY_CARE_PROVIDER_SITE_OTHER): Payer: PPO

## 2016-09-25 VITALS — BP 128/78 | HR 72 | Temp 97.9°F | Resp 16 | Wt 130.0 lb

## 2016-09-25 DIAGNOSIS — K219 Gastro-esophageal reflux disease without esophagitis: Secondary | ICD-10-CM | POA: Diagnosis not present

## 2016-09-25 DIAGNOSIS — I10 Essential (primary) hypertension: Secondary | ICD-10-CM | POA: Diagnosis not present

## 2016-09-25 DIAGNOSIS — R059 Cough, unspecified: Secondary | ICD-10-CM

## 2016-09-25 DIAGNOSIS — F419 Anxiety disorder, unspecified: Secondary | ICD-10-CM

## 2016-09-25 DIAGNOSIS — Z9109 Other allergy status, other than to drugs and biological substances: Secondary | ICD-10-CM | POA: Diagnosis not present

## 2016-09-25 DIAGNOSIS — R053 Chronic cough: Secondary | ICD-10-CM

## 2016-09-25 DIAGNOSIS — R05 Cough: Secondary | ICD-10-CM

## 2016-09-25 DIAGNOSIS — J40 Bronchitis, not specified as acute or chronic: Secondary | ICD-10-CM | POA: Diagnosis not present

## 2016-09-25 DIAGNOSIS — E7800 Pure hypercholesterolemia, unspecified: Secondary | ICD-10-CM

## 2016-09-25 MED ORDER — LOSARTAN POTASSIUM 100 MG PO TABS: 100.0000 mg | ORAL_TABLET | Freq: Every day | ORAL | 1 refills | Status: DC

## 2016-09-25 NOTE — Patient Instructions (Signed)
Stop lisinopril.  Start losartan 100mg per day 

## 2016-09-25 NOTE — Progress Notes (Signed)
Patient ID: Patricia Pacheco, female   DOB: 1940/07/16, 76 y.o.   MRN: [id number]   Subjective:    Patient ID: Patricia Pacheco, female    DOB: [birth year], 76 y.o.   MRN: [id number]  HPI  Patient here for a scheduled follow up.  Has issues with anxiety.  Controlled on prozac.  Persistent allergy issues. Has been evaluated by ENT and tried multiple treatments.  Stable.  Sees Dr Pryor Ochoa.  On singulair and nasal spray.  Still with some drainage and congestion.  Some cough.  No chest pain.  No sob.  No acid relfux.  States is controlled.  No abdominal pain.  Bowels moving.  No urine change.  Discussed lab results.    Past Medical History:  Diagnosis Date  . Allergy    allergy shots ([facility])  . Arthritis   . Asthma   . CAD (coronary artery disease)    cath 11/27/04 - moderated LAD disease  . Cancer ([place])    skin  . GERD (gastroesophageal reflux disease)    EGD (05/25/04) - slight presbyesophagus, mild esophagitis, mild gastritis, mild duodenitis - positive H. pylori.   Marland Kitchen Neuropathy [facility])    Past Surgical History:  Procedure Laterality Date  . APPENDECTOMY  1967   Family History  Problem Relation Age of Onset  . Arthritis Mother   . Colon polyps Sister    Social History   Social History  . Marital status: Married    Spouse name: N/A  . Number of children: N/A  . Years of education: N/A   Social History Main Topics  . Smoking status: Never Smoker  . Smokeless tobacco: Never Used  . Alcohol use No  . Drug use: No  . Sexual activity: Not Asked   Other Topics Concern  . None   Social History Narrative  . None    Outpatient Encounter Prescriptions as of 09/25/2016  Medication Sig  . ALPRAZolam (XANAX) 0.25 MG tablet Take 1 tablet (0.25 mg total) by mouth daily as needed. for anxiety  . antiseptic oral rinse (BIOTENE) LIQD 15 mLs by Mouth Rinse route as needed for dry mouth.  Marland Kitchen aspirin 81 MG tablet Take 81 mg by mouth daily.  . Calcium Carbonate-Vit D-Min (CALCIUM 1200 PO) Take by  mouth daily.  . COD LIVER OIL PO Take by mouth.  . Coenzyme Q10 (CO Q-10) 100 MG CAPS Take 1 capsule by mouth daily.  Marland Kitchen docusate sodium (COLACE) 100 MG capsule Take by mouth.  . fexofenadine (ALLEGRA) 180 MG tablet Take 180 mg by mouth daily.  . fluocinolone (SYNALAR) 0.01 % external solution Apply topically 2 (two) times daily.  Marland Kitchen FLUoxetine (PROZAC) 10 MG capsule TAKE 1 CAPSULE BY MOUTH EVERY DAY  . fluticasone (FLONASE) 50 MCG/ACT nasal spray   . montelukast (SINGULAIR) 10 MG tablet Take 10 mg by mouth at bedtime.  . Multiple Vitamin (MULTIVITAMIN) tablet Take 1 tablet by mouth daily.  . mupirocin ointment (BACTROBAN) 2 % Place 1 application into the nose 2 (two) times daily.  . Naproxen Sodium (ALEVE PO) Take by mouth as needed.  . Omega-3 Fatty Acids (FISH OIL PO) Take by mouth daily.  . silver sulfADIAZINE (SILVADENE) 1 % cream Apply 1 application topically daily.  Marland Kitchen triamcinolone (KENALOG) 0.025 % cream Apply 1 application topically 2 (two) times daily.  . [DISCONTINUED] lisinopril (PRINIVIL,ZESTRIL) 40 MG tablet TAKE ONE TABLET BY MOUTH EVERY DAY  . [DISCONTINUED] omeprazole (PRILOSEC) 40 MG capsule TAKE 1 CAPSULE EVERY DAY  .  CHLORPHENIRAMINE-PHENYLEPHRINE PO Take 1 tablet by mouth every 4 (four) hours as needed.  Marland Kitchen losartan (COZAAR) 100 MG tablet Take 1 tablet (100 mg total) by mouth daily.  . pseudoephedrine (SUDAFED) 60 MG tablet Take 60 mg by mouth every 4 (four) hours as needed for congestion. Reported on 12/23/2015  . UNABLE TO FIND    No facility-administered encounter medications on file as of 09/25/2016.     Review of Systems  Constitutional: Negative for appetite change and unexpected weight change.  HENT: Positive for congestion and postnasal drip. Negative for sinus pressure.   Respiratory: Positive for cough. Negative for chest tightness and shortness of breath.   Cardiovascular: Negative for chest pain, palpitations and leg swelling.  Gastrointestinal: Negative for  abdominal pain, diarrhea, nausea and vomiting.  Genitourinary: Negative for difficulty urinating and dysuria.  Musculoskeletal: Negative for back pain and joint swelling.  Skin: Negative for color change and rash.  Neurological: Negative for dizziness, light-headedness and headaches.  Psychiatric/Behavioral: Negative for agitation and dysphoric mood.       Objective:    Physical Exam  Constitutional: She appears well-developed and well-nourished. No distress.  HENT:  Nose: Nose normal.  Mouth/Throat: Oropharynx is clear and moist.  Neck: Neck supple. No thyromegaly present.  Cardiovascular: Normal rate and regular rhythm.   Pulmonary/Chest: Breath sounds normal. No respiratory distress. She has no wheezes.  Abdominal: Soft. Bowel sounds are normal. There is no tenderness.  Musculoskeletal: She exhibits no edema or tenderness.  Lymphadenopathy:    She has no cervical adenopathy.  Skin: No rash noted. No erythema.  Psychiatric: She has a normal mood and affect. Her behavior is normal.    BP 128/78 (BP Location: Left Arm, Patient Position: Sitting, Cuff Size: Normal)   Pulse 72   Temp 97.9 F (36.6 C) (Oral)   Resp 16   Wt 130 lb (59 kg)   LMP 08/29/1995   SpO2 98%   BMI 24.56 kg/m  Wt Readings from Last 3 Encounters:  09/25/16 130 lb (59 kg)  05/23/16 132 lb (59.9 kg)  03/28/16 130 lb 9.6 oz (59.2 kg)     Lab Results  Component Value Date   WBC 5.0 09/19/2016   HGB 12.0 09/19/2016   HCT 36.2 09/19/2016   PLT 195.0 09/19/2016   GLUCOSE 101 (H) 09/19/2016   CHOL 207 (H) 09/19/2016   TRIG 102.0 09/19/2016   HDL 62.30 09/19/2016   LDLDIRECT 115.0 09/04/2012   LDLCALC 125 (H) 09/19/2016   ALT 15 09/19/2016   AST 21 09/19/2016   NA 141 09/19/2016   K 4.2 09/19/2016   CL 103 09/19/2016   CREATININE 0.98 09/19/2016   BUN 25 (H) 09/19/2016   CO2 32 09/19/2016   TSH 1.39 05/21/2016    Mm Digital Screening  Result Date: 05/09/2016 CLINICAL DATA:  Screening.  EXAM: DIGITAL SCREENING BILATERAL MAMMOGRAM WITH CAD COMPARISON:  Previous exam(s). ACR Breast Density Category b: There are scattered areas of fibroglandular density. FINDINGS: There are no findings suspicious for malignancy. Images were processed with CAD. IMPRESSION: No mammographic evidence of malignancy. A result letter of this screening mammogram will be mailed directly to the patient. RECOMMENDATION: Screening mammogram in one year. (Code:SM-B-01Y) BI-RADS CATEGORY  1: Negative. Electronically Signed   By: Franki Cabot M.D.   On: 05/09/2016 16:34       Assessment & Plan:   Problem List Items Addressed This Visit    Anxiety    Stable on prozac.  Follow.  Cough    Persistent cough.  Question if ace inhibitor contributing.  Continue current regimen.  Stop lisinopril.  Start losartan 100mg  q day.  Will need metabolic panel in 80-03 days.   Check cxr.        Environmental allergies    Seeing Dr Pryor Ochoa.  Using singulair and nasal spray.  Continue f/u with ENT.  Acid reflux controlled.  Will check cxr given cough.        Essential hypertension    Blood pressure as outlined.  Have her continue to spot check her pressure.  Follow metabolic panel.        Relevant Medications   losartan (COZAAR) 100 MG tablet   Other Relevant Orders   Basic metabolic panel   GERD (gastroesophageal reflux disease)    Stable on prilosec.        Pure hypercholesterolemia    Off crestor.  Discussed cholesterol labs.  LDL 125.  Continue low cholesterol diet and exercise.  Follow lipid panel.        Relevant Medications   losartan (COZAAR) 100 MG tablet    Other Visit Diagnoses    Persistent cough    -  Primary   Relevant Orders   DG Chest 2 View (Completed)       Einar Pheasant, MD

## 2016-09-25 NOTE — Progress Notes (Signed)
Pre visit review using our clinic review tool, if applicable. No additional management support is needed unless otherwise documented below in the visit note. 

## 2016-09-30 ENCOUNTER — Encounter: Payer: Self-pay | Admitting: Internal Medicine

## 2016-09-30 DIAGNOSIS — R059 Cough, unspecified: Secondary | ICD-10-CM | POA: Insufficient documentation

## 2016-09-30 DIAGNOSIS — R05 Cough: Secondary | ICD-10-CM | POA: Insufficient documentation

## 2016-09-30 NOTE — Assessment & Plan Note (Signed)
Stable on prilosec

## 2016-09-30 NOTE — Assessment & Plan Note (Signed)
Blood pressure as outlined.  Have her continue to spot check her pressure.  Follow metabolic panel.

## 2016-09-30 NOTE — Assessment & Plan Note (Addendum)
Persistent cough.  Question if ace inhibitor contributing.  Continue current regimen.  Stop lisinopril.  Start losartan 100mg  q day.  Will need metabolic panel in 90-30 days.   Check cxr.

## 2016-09-30 NOTE — Assessment & Plan Note (Signed)
Stable on prozac.  Follow.   

## 2016-09-30 NOTE — Assessment & Plan Note (Signed)
Off crestor.  Discussed cholesterol labs.  LDL 125.  Continue low cholesterol diet and exercise.  Follow lipid panel.

## 2016-09-30 NOTE — Assessment & Plan Note (Signed)
Seeing Dr Pryor Ochoa.  Using singulair and nasal spray.  Continue f/u with ENT.  Acid reflux controlled.  Will check cxr given cough.

## 2016-10-03 ENCOUNTER — Telehealth: Payer: Self-pay | Admitting: Internal Medicine

## 2016-10-03 DIAGNOSIS — M9903 Segmental and somatic dysfunction of lumbar region: Secondary | ICD-10-CM | POA: Diagnosis not present

## 2016-10-03 DIAGNOSIS — M955 Acquired deformity of pelvis: Secondary | ICD-10-CM | POA: Diagnosis not present

## 2016-10-03 DIAGNOSIS — M5416 Radiculopathy, lumbar region: Secondary | ICD-10-CM | POA: Diagnosis not present

## 2016-10-03 DIAGNOSIS — M9905 Segmental and somatic dysfunction of pelvic region: Secondary | ICD-10-CM | POA: Diagnosis not present

## 2016-10-03 NOTE — Telephone Encounter (Signed)
Patient called back states that she would like mediation changed she has never had any GI problems like that before and she feels like it came form medication. The following day after GI issues had BP readings of 176/95 she started back on the old bp meds.

## 2016-10-03 NOTE — Telephone Encounter (Signed)
How long did she take the medication?  Did she have any other symptoms?  If just had one night of diarrhea and cramping, then may have been GI virus and not losartan.  I can change medication if necessary, but not sure was from losartan.  Let me know if problems or questions.

## 2016-10-03 NOTE — Telephone Encounter (Signed)
Pt called about the medication of losartan (COZAAR) 100 MG tablet. Which gave side effects pt had diarrhea and cramps for 1 night pt has stopped taking the medication. Pt states she would like to know what Dr Nicki Reaper would like for her to do? Please advise?  Call pt @ [phone]. Thank you!

## 2016-10-03 NOTE — Telephone Encounter (Signed)
Please advise 

## 2016-10-03 NOTE — Telephone Encounter (Signed)
Left voice mail to call back 

## 2016-10-04 MED ORDER — AMLODIPINE BESYLATE 5 MG PO TABS: 5.0000 mg | ORAL_TABLET | Freq: Every day | ORAL | 3 refills | Status: DC

## 2016-10-04 NOTE — Telephone Encounter (Signed)
Please confirm with pt how her blood pressure is doing now that she is back on her previous blood pressure medication.  Confirm symptoms have completely resolved.  Can start amlodipine 5mg  q day.  Monitor blood pressure.  Will need f/u appt scheduled with me if not already scheduled.

## 2016-10-04 NOTE — Telephone Encounter (Signed)
Patient advised of below and verbalized an understanding. Script sent in per below .

## 2016-10-04 NOTE — Telephone Encounter (Signed)
Patient advised of below .  She states she stopped losartan due to severe stomach cramps and diarrhea  "like and explosion"  , no chills on 4-6.   She still hasn't re started medication .  She has been monitoring blood pressure  Patient states she is still having allergies symptoms sees allergist  .   Please advise 4/11 156/75 630 154/88 1030pm 4/10 142/70 hr 79 4/9 145hr 79 4/9 145/hr 75 4/8 164/86  4/7 Bp 149/84 09/6174/95

## 2016-10-04 NOTE — Telephone Encounter (Signed)
Left voice mail to call back 

## 2016-10-04 NOTE — Telephone Encounter (Signed)
See attached message.  Can start amodipine if she is agreeable.  Monitor blood pressure.

## 2016-10-05 ENCOUNTER — Other Ambulatory Visit: Payer: PPO

## 2016-10-17 NOTE — Telephone Encounter (Signed)
Left pt message asking to call Allison back directly at [phone] to schedule AWV. Thanks! °

## 2016-10-31 ENCOUNTER — Telehealth: Payer: Self-pay | Admitting: *Deleted

## 2016-10-31 DIAGNOSIS — R05 Cough: Secondary | ICD-10-CM

## 2016-10-31 DIAGNOSIS — R059 Cough, unspecified: Secondary | ICD-10-CM

## 2016-10-31 NOTE — Telephone Encounter (Signed)
Please advise 

## 2016-10-31 NOTE — Telephone Encounter (Signed)
Patient requested to see a pulmonary specialist.  Pt contact [phone]

## 2016-11-01 NOTE — Telephone Encounter (Signed)
Order placed for pulmonary referral.  

## 2016-11-07 DIAGNOSIS — M9903 Segmental and somatic dysfunction of lumbar region: Secondary | ICD-10-CM | POA: Diagnosis not present

## 2016-11-07 DIAGNOSIS — M955 Acquired deformity of pelvis: Secondary | ICD-10-CM | POA: Diagnosis not present

## 2016-11-07 DIAGNOSIS — M9905 Segmental and somatic dysfunction of pelvic region: Secondary | ICD-10-CM | POA: Diagnosis not present

## 2016-11-07 DIAGNOSIS — M5416 Radiculopathy, lumbar region: Secondary | ICD-10-CM | POA: Diagnosis not present

## 2016-11-22 ENCOUNTER — Other Ambulatory Visit: Payer: Self-pay | Admitting: *Deleted

## 2016-11-22 ENCOUNTER — Encounter: Payer: Self-pay | Admitting: *Deleted

## 2016-11-22 ENCOUNTER — Encounter: Payer: Self-pay | Admitting: Pulmonary Disease

## 2016-11-22 ENCOUNTER — Ambulatory Visit (INDEPENDENT_AMBULATORY_CARE_PROVIDER_SITE_OTHER): Payer: PPO | Admitting: Pulmonary Disease

## 2016-11-22 VITALS — BP 134/70 | HR 85 | Resp 16 | Ht 60.0 in | Wt 130.0 lb

## 2016-11-22 DIAGNOSIS — R05 Cough: Secondary | ICD-10-CM | POA: Diagnosis not present

## 2016-11-22 DIAGNOSIS — Z8709 Personal history of other diseases of the respiratory system: Secondary | ICD-10-CM

## 2016-11-22 DIAGNOSIS — R053 Chronic cough: Secondary | ICD-10-CM

## 2016-11-22 MED ORDER — FLUTICASONE FUROATE-VILANTEROL 100-25 MCG/INH IN AEPB: 1.0000 | INHALATION_SPRAY | Freq: Every day | RESPIRATORY_TRACT | 0 refills | Status: DC

## 2016-11-22 MED ORDER — FEXOFENADINE HCL 180 MG PO TABS: 180.0000 mg | ORAL_TABLET | Freq: Every day | ORAL | 0 refills | Status: DC | PRN

## 2016-11-22 MED ORDER — FLUTICASONE FUROATE-VILANTEROL 100-25 MCG/INH IN AEPB: 1.0000 | INHALATION_SPRAY | Freq: Every day | RESPIRATORY_TRACT | 5 refills | Status: DC

## 2016-11-22 NOTE — Addendum Note (Signed)
Addended by: Devona Konig on: 11/22/2016 09:39 AM   Modules accepted: Orders

## 2016-11-22 NOTE — Patient Instructions (Signed)
You have chronic cough which is probably multifactorial. Possible contributing factors include allergies, chronic nasal and sinus congestion and inflammation, possible asthma, gastroesophageal reflux disease.  You are already treated for allergies and nasal congestion. Also for GERD.  We will begin Breo 100-25 one inhalation daily and assess your response to this on follow-up.  Prior to next visit I would like to obtain full pulmonary function tests  Follow-up in 4-6 weeks

## 2016-11-22 NOTE — Progress Notes (Addendum)
PULMONARY CONSULT NOTE  Requesting MD/Service: Einar Pheasant, M.D. Date of initial consultation: 11/22/16 Reason for consultation: Asthma, cough  PT PROFILE: 76 y.o. female never smoker with history of asthma referred for evaluation of chronic cough  DATA: 11/22/16 office spirometry: Entirely normal  HPI:  As above. She is referred by Dr. Nicki Reaper for evaluation of chronic cough and dyspnea. She reports a history of asthma "many years ago. She is unable to pinpoint the exact time of this diagnosis but notes that it was 20-30 years ago. She had "flareups" for several years but none in the past of decades. At this point her history becomes somewhat vague as she does report episodic dyspnea as well as fatigue. Most importantly, she is concerned about chronic cough. The cough has been present for the past 5-10 years. She describes nasal and chest congestion. Nonetheless, the cough is minimally productive. There is no season to seasonal variation and no day-to-day variation. The only trigger that she can identify is abrupt changes in temperature (such as going from out-of-doors into an air-conditioned home or vice versa). She has a history of reflux disease for which she takes omeprazole. She has chronic hoarseness which varies depending on the status of her "allergies" and reflux disease.  She has chronic hypertension on multiple medications. She was on an ACE inhibitor until 09/30/16. She believes her cough is approximately 50% improved after discontinuation of the ACE inhibitor  For "environmetal allergies" she is on montelukast, fexofenadine and fluticasone nasal spray  Past Medical History:  Diagnosis Date  . Allergy    allergy shots (Juengel)  . Arthritis   . Asthma   . CAD (coronary artery disease)    cath 11/27/04 - moderated LAD disease  . Cancer ([place])    skin  . GERD (gastroesophageal reflux disease)    EGD (05/25/04) - slight presbyesophagus, mild esophagitis, mild gastritis, mild  duodenitis - positive H. pylori.   Marland Kitchen Neuropathy     Past Surgical History:  Procedure Laterality Date  . APPENDECTOMY  1967    MEDICATIONS: I have reviewed all medications and confirmed regimen as documented  Social History   Social History  . Marital status: Married    Spouse name: N/A  . Number of children: N/A  . Years of education: N/A   Occupational History  . Not on file.   Social History Main Topics  . Smoking status: Never Smoker  . Smokeless tobacco: Never Used  . Alcohol use No  . Drug use: No  . Sexual activity: Not on file   Other Topics Concern  . Not on file   Social History Narrative  . No narrative on file    Family History  Problem Relation Age of Onset  . Arthritis Mother   . Colon polyps Sister     ROS: No fever, myalgias/arthralgias, unexplained weight loss or weight gain No new focal weakness or sensory deficits No otalgia, hearing loss, visual changes, nasal and sinus symptoms, mouth and throat problems No neck pain or adenopathy No abdominal pain, N/V/D, diarrhea, change in bowel pattern No dysuria, change in urinary pattern   Vitals:   11/22/16 0850 11/22/16 0857  BP:  134/70  Pulse:  85  Resp: 16   SpO2:  94%  Weight: 130 lb (59 kg)   Height: 5' (1.524 m)      EXAM:  Gen: WDWN, No overt respiratory distress HEENT: NCAT, sclera white, oropharynx normal, nasal passages are patent, nasal mucosa pink, minimal clear mucus  Neck: Supple without LAN, thyromegaly, JVD Lungs: breath sounds full, percussion normal, no wheezes or other adventitious sounds Cardiovascular: RRR, no murmurs noted Abdomen: Soft, nontender, normal BS Ext: without clubbing, cyanosis, edema Neuro: CNs grossly intact, motor and sensory intact Skin: Limited exam, no lesions noted  DATA:   BMP Latest Ref Rng & Units 09/19/2016 05/21/2016 12/30/2015  Glucose 70 - 99 mg/dL 101(H) 88 88  BUN 6 - 23 mg/dL 25(H) 14 18  Creatinine 0.40 - 1.20 mg/dL 0.98 0.85 0.95   Sodium 135 - 145 mEq/L 141 143 140  Potassium 3.5 - 5.1 mEq/L 4.2 4.1 3.8  Chloride 96 - 112 mEq/L 103 106 104  CO2 19 - 32 mEq/L 32 31 32  Calcium 8.4 - 10.5 mg/dL 10.3 9.3 9.5    CBC Latest Ref Rng & Units 09/19/2016 05/21/2016 07/15/2015  WBC 4.0 - 10.5 K/uL 5.0 4.5 4.9  Hemoglobin 12.0 - 15.0 g/dL 12.0 11.6(L) 12.3  Hematocrit 36.0 - 46.0 % 36.2 35.2(L) 37.6  Platelets 150.0 - 400.0 K/uL 195.0 195.0 186.0    CXR (09/25/16): diffuse mild interstitial prominence    Spirometry (11/22/16):   IMPRESSION:     ICD-9-CM ICD-10-CM   1. Chronic cough 786.2 R05 Spirometry with Graph     Pulmonary Function Test [facility] Only  2. History of asthma V12.69 Z87.09 Spirometry with Graph     Pulmonary Function Test [facility] Only   She has diagnoses of environmental allergies, chronic rhinosinusitis, gastroesophageal reflux disease. She also has a remote history of asthma which is not presently treated. All of these factors may be contributing to her cough. She describes shortness of breath but has difficulty quantifying it severity. Mostly, it seems that she is describing a need to sigh.  PLAN:  Trial of Breo 100/25 - one inhalation daily Full pulmonary function tests ordered Follow-up in 4-6 weeks or sooner as needed   Merton Border, MD PCCM service Mobile [phone] Pager [phone] 11/22/2016

## 2016-11-30 ENCOUNTER — Telehealth: Payer: Self-pay | Admitting: Internal Medicine

## 2016-11-30 ENCOUNTER — Ambulatory Visit: Payer: PPO | Admitting: Internal Medicine

## 2016-11-30 NOTE — Telephone Encounter (Signed)
Provider informed.

## 2016-11-30 NOTE — Telephone Encounter (Signed)
FYI - Pt called and cancelled appt, pt's husband is in the hospital and having a procedure done.

## 2016-12-05 DIAGNOSIS — M9905 Segmental and somatic dysfunction of pelvic region: Secondary | ICD-10-CM | POA: Diagnosis not present

## 2016-12-05 DIAGNOSIS — M955 Acquired deformity of pelvis: Secondary | ICD-10-CM | POA: Diagnosis not present

## 2016-12-05 DIAGNOSIS — M9903 Segmental and somatic dysfunction of lumbar region: Secondary | ICD-10-CM | POA: Diagnosis not present

## 2016-12-05 DIAGNOSIS — M5416 Radiculopathy, lumbar region: Secondary | ICD-10-CM | POA: Diagnosis not present

## 2016-12-25 ENCOUNTER — Ambulatory Visit: Payer: PPO | Attending: Pulmonary Disease

## 2016-12-25 DIAGNOSIS — R942 Abnormal results of pulmonary function studies: Secondary | ICD-10-CM | POA: Diagnosis not present

## 2016-12-25 DIAGNOSIS — R05 Cough: Secondary | ICD-10-CM | POA: Diagnosis not present

## 2016-12-25 DIAGNOSIS — Z8709 Personal history of other diseases of the respiratory system: Secondary | ICD-10-CM

## 2016-12-25 DIAGNOSIS — R053 Chronic cough: Secondary | ICD-10-CM

## 2016-12-25 DIAGNOSIS — J45909 Unspecified asthma, uncomplicated: Secondary | ICD-10-CM | POA: Insufficient documentation

## 2016-12-31 ENCOUNTER — Encounter: Payer: Self-pay | Admitting: Pulmonary Disease

## 2016-12-31 ENCOUNTER — Ambulatory Visit (INDEPENDENT_AMBULATORY_CARE_PROVIDER_SITE_OTHER): Payer: PPO | Admitting: Pulmonary Disease

## 2016-12-31 VITALS — BP 128/84 | HR 77 | Resp 16 | Ht 60.0 in | Wt 131.0 lb

## 2016-12-31 DIAGNOSIS — R05 Cough: Secondary | ICD-10-CM | POA: Diagnosis not present

## 2016-12-31 DIAGNOSIS — R059 Cough, unspecified: Secondary | ICD-10-CM

## 2016-12-31 DIAGNOSIS — Z8709 Personal history of other diseases of the respiratory system: Secondary | ICD-10-CM

## 2016-12-31 DIAGNOSIS — R0609 Other forms of dyspnea: Secondary | ICD-10-CM | POA: Diagnosis not present

## 2016-12-31 MED ORDER — BECLOMETHASONE DIPROPIONATE 80 MCG/ACT IN AERS: 2.0000 | INHALATION_SPRAY | Freq: Two times a day (BID) | RESPIRATORY_TRACT | 0 refills | Status: DC

## 2016-12-31 MED ORDER — ALBUTEROL SULFATE HFA 108 (90 BASE) MCG/ACT IN AERS: 1.0000 | INHALATION_SPRAY | RESPIRATORY_TRACT | 6 refills | Status: DC | PRN

## 2016-12-31 MED ORDER — MOMETASONE FUROATE 220 MCG/INH IN AEPB: 2.0000 | INHALATION_SPRAY | Freq: Every day | RESPIRATORY_TRACT | 12 refills | Status: DC

## 2016-12-31 NOTE — Patient Instructions (Signed)
Sample of Qvar provided - use 2 inhalations once a day. Rinse mouth after use When the Qvar has been used up, the refill will be Asmanex - again, 2 inhalations once a day. Rinse mouth after use Albuterol inhaler has been prescribed. 1-2 inhalations every 6 hours as needed for shortness of breath, chest tightness, wheezing  Follow-up in 3 months

## 2017-01-01 NOTE — Progress Notes (Signed)
PULMONARY OFFICE FOLLOW UP NOTE  Requesting MD/Service: Einar Pheasant, M.D. Date of initial consultation: 11/22/16 Reason for consultation: Asthma, cough  PT PROFILE: 76 y.o. female never smoker with history of asthma referred for evaluation of chronic cough  DATA: 11/22/16 office spirometry: Entirely normal 12/25/16 PFTs: entirely normal   SUBJ:  Routine re-eval for cough, DOE and hx of asthma. Tried Breo sample provided last visit which initially helped "loosen phlegm" but caused choking sensation and tachycardia, so stopped. Still with mild cough. Has recently stopped nasal steroid too due to nosebleeds. Denies CP, fever, purulent sputum, hemoptysis, LE edema and calf tenderness.  Vitals:   12/31/16 1110 12/31/16 1112  BP:  128/84  Pulse:  77  Resp: 16   SpO2:  96%  Weight: 131 lb (59.4 kg)   Height: 5' (1.524 m)   RA   EXAM:  Gen: NAD HEENT: WNL. Nasal mucosa pink and moist Lungs: Full BS, no wheezes Cardiovascular: Reg, no M Abdomen: Soft, nontender, normal BS Ext: without clubbing, cyanosis, edema Neuro: grossly intact  DATA:   BMP Latest Ref Rng & Units 09/19/2016 05/21/2016 12/30/2015  Glucose 70 - 99 mg/dL 101(H) 88 88  BUN 6 - 23 mg/dL 25(H) 14 18  Creatinine 0.40 - 1.20 mg/dL 0.98 0.85 0.95  Sodium 135 - 145 mEq/L 141 143 140  Potassium 3.5 - 5.1 mEq/L 4.2 4.1 3.8  Chloride 96 - 112 mEq/L 103 106 104  CO2 19 - 32 mEq/L 32 31 32  Calcium 8.4 - 10.5 mg/dL 10.3 9.3 9.5    CBC Latest Ref Rng & Units 09/19/2016 05/21/2016 07/15/2015  WBC 4.0 - 10.5 K/uL 5.0 4.5 4.9  Hemoglobin 12.0 - 15.0 g/dL 12.0 11.6(L) 12.3  Hematocrit 36.0 - 46.0 % 36.2 35.2(L) 37.6  Platelets 150.0 - 400.0 K/uL 195.0 195.0 186.0    CXR; NNF    IMPRESSION:     ICD-10-CM   1. Cough R05   2. DOE (dyspnea on exertion) R06.09   3. History of asthma Z87.09    The DPI might be causing throat irritation The LABA might be contributing to tachycardia  PLAN:  Change Breo to aerosol  ICS - Qvar sample provided. Asmanex Rx ordered as preferred on her plan  2 inhalations daily Albuterol PRN for SOB, cough, chest tightness or wheezing Full pulmonary function tests ordered Follow-up in 3 months   Merton Border, MD PCCM service Mobile [phone] Pager [phone] 01/01/2017 9:18 PM

## 2017-01-02 DIAGNOSIS — M5416 Radiculopathy, lumbar region: Secondary | ICD-10-CM | POA: Diagnosis not present

## 2017-01-02 DIAGNOSIS — M955 Acquired deformity of pelvis: Secondary | ICD-10-CM | POA: Diagnosis not present

## 2017-01-02 DIAGNOSIS — M9905 Segmental and somatic dysfunction of pelvic region: Secondary | ICD-10-CM | POA: Diagnosis not present

## 2017-01-02 DIAGNOSIS — M9903 Segmental and somatic dysfunction of lumbar region: Secondary | ICD-10-CM | POA: Diagnosis not present

## 2017-01-30 DIAGNOSIS — M5416 Radiculopathy, lumbar region: Secondary | ICD-10-CM | POA: Diagnosis not present

## 2017-01-30 DIAGNOSIS — M5134 Other intervertebral disc degeneration, thoracic region: Secondary | ICD-10-CM | POA: Diagnosis not present

## 2017-01-30 DIAGNOSIS — M9903 Segmental and somatic dysfunction of lumbar region: Secondary | ICD-10-CM | POA: Diagnosis not present

## 2017-01-30 DIAGNOSIS — M9902 Segmental and somatic dysfunction of thoracic region: Secondary | ICD-10-CM | POA: Diagnosis not present

## 2017-02-07 ENCOUNTER — Encounter: Payer: Self-pay | Admitting: Internal Medicine

## 2017-02-07 ENCOUNTER — Ambulatory Visit (INDEPENDENT_AMBULATORY_CARE_PROVIDER_SITE_OTHER): Payer: PPO | Admitting: Internal Medicine

## 2017-02-07 VITALS — BP 128/84 | HR 84 | Temp 98.6°F | Resp 12 | Ht 60.0 in | Wt 132.2 lb

## 2017-02-07 DIAGNOSIS — R079 Chest pain, unspecified: Secondary | ICD-10-CM | POA: Diagnosis not present

## 2017-02-07 DIAGNOSIS — E7800 Pure hypercholesterolemia, unspecified: Secondary | ICD-10-CM

## 2017-02-07 DIAGNOSIS — F419 Anxiety disorder, unspecified: Secondary | ICD-10-CM

## 2017-02-07 DIAGNOSIS — J4520 Mild intermittent asthma, uncomplicated: Secondary | ICD-10-CM | POA: Diagnosis not present

## 2017-02-07 DIAGNOSIS — I10 Essential (primary) hypertension: Secondary | ICD-10-CM | POA: Diagnosis not present

## 2017-02-07 DIAGNOSIS — K219 Gastro-esophageal reflux disease without esophagitis: Secondary | ICD-10-CM

## 2017-02-07 NOTE — Progress Notes (Signed)
Pre-visit discussion using our clinic review tool. No additional management support is needed unless otherwise documented below in the visit note.  

## 2017-02-07 NOTE — Progress Notes (Signed)
Patient ID: Patricia Pacheco, female   DOB: 12-11-1940, 76 y.o.   MRN: [id number]   Subjective:    Patient ID: Patricia Pacheco, female    DOB: 12-Aug-1940, 76 y.o.   MRN: [id number]  HPI  Patient here for a scheduled follow up.  She reports she is doing relatively well.  Increased stress with her husband's issues.  Overall she feels she is handling things relatively well.  Saw Dr Alva Garnet 12/31/16.  F/u for asthma and cough.  Was placed on breo.  Intolerant.  Off.  Overall she feels her breathing is stable.  She does rerport pain under left breast and left side of chest.  Intermittent.  Unclear of triggers.  Has had pulmonary w/up.  She request cardiology w/up for further evaluation.  No abdominal pain.  Bowels moving.      Past Medical History:  Diagnosis Date  . Allergy    allergy shots (Juengel)  . Arthritis   . Asthma   . CAD (coronary artery disease)    cath 11/27/04 - moderated LAD disease  . Cancer ([place])    skin  . GERD (gastroesophageal reflux disease)    EGD (05/25/04) - slight presbyesophagus, mild esophagitis, mild gastritis, mild duodenitis - positive H. pylori.   Marland Kitchen Neuropathy    Past Surgical History:  Procedure Laterality Date  . APPENDECTOMY  1967   Family History  Problem Relation Age of Onset  . Arthritis Mother   . Colon polyps Sister    Social History   Social History  . Marital status: Married    Spouse name: N/A  . Number of children: N/A  . Years of education: N/A   Social History Main Topics  . Smoking status: Never Smoker  . Smokeless tobacco: Never Used  . Alcohol use No  . Drug use: No  . Sexual activity: Not Asked   Other Topics Concern  . None   Social History Narrative  . None    Outpatient Encounter Prescriptions as of 02/07/2017  Medication Sig  . albuterol (PROVENTIL HFA;VENTOLIN HFA) 108 (90 Base) MCG/ACT inhaler Inhale 1-2 puffs into the lungs every 4 (four) hours as needed for wheezing or shortness of breath.  . ALPRAZolam (XANAX) 0.25 MG  tablet Take 1 tablet (0.25 mg total) by mouth daily as needed. for anxiety  . amLODipine (NORVASC) 5 MG tablet Take 1 tablet (5 mg total) by mouth daily.  Marland Kitchen antiseptic oral rinse (BIOTENE) LIQD 15 mLs by Mouth Rinse route as needed for dry mouth.  Marland Kitchen aspirin 81 MG tablet Take 81 mg by mouth daily.  . beclomethasone (QVAR) 80 MCG/ACT inhaler Inhale 2 puffs into the lungs 2 (two) times daily.  . Calcium Carbonate-Vit D-Min (CALCIUM 1200 PO) Take by mouth daily.  . COD LIVER OIL PO Take by mouth.  . Coenzyme Q10 (CO Q-10) 100 MG CAPS Take 1 capsule by mouth daily.  Marland Kitchen docusate sodium (COLACE) 100 MG capsule Take by mouth.  . fexofenadine (ALLEGRA) 180 MG tablet Take 1 tablet (180 mg total) by mouth daily as needed for allergies or rhinitis.  . fluocinolone (SYNALAR) 0.01 % external solution Apply topically 2 (two) times daily.  Marland Kitchen FLUoxetine (PROZAC) 10 MG capsule TAKE 1 CAPSULE BY MOUTH EVERY DAY  . fluticasone (FLONASE) 50 MCG/ACT nasal spray Place 1 spray into both nostrils daily as needed.   . mometasone (ELOCON) 0.1 % lotion Apply 1 application topically as directed.  . montelukast (SINGULAIR) 10 MG tablet Take 10 mg  by mouth at bedtime.  . Multiple Vitamin (MULTIVITAMIN) tablet Take 1 tablet by mouth daily.  . mupirocin ointment (BACTROBAN) 2 % Place 1 application into the nose 2 (two) times daily.  . Naproxen Sodium (ALEVE PO) Take by mouth as needed.  . Omega-3 Fatty Acids (FISH OIL PO) Take by mouth daily.  Marland Kitchen omeprazole (PRILOSEC) 40 MG capsule TAKE 1 CAPSULE EVERY DAY  . silver sulfADIAZINE (SILVADENE) 1 % cream Apply 1 application topically daily.  Marland Kitchen triamcinolone (KENALOG) 0.025 % cream Apply 1 application topically 2 (two) times daily.  Marland Kitchen UNABLE TO FIND   . [DISCONTINUED] losartan (COZAAR) 100 MG tablet Take 1 tablet (100 mg total) by mouth daily.  . [DISCONTINUED] mometasone (ASMANEX 60 METERED DOSES) 220 MCG/INH inhaler Inhale 2 puffs into the lungs daily.   No  facility-administered encounter medications on file as of 02/07/2017.     Review of Systems  Constitutional: Negative for appetite change and unexpected weight change.  HENT: Negative for congestion and sinus pressure.   Respiratory: Negative for cough, chest tightness and shortness of breath.   Cardiovascular: Positive for chest pain. Negative for palpitations and leg swelling.  Gastrointestinal: Negative for abdominal pain, diarrhea, nausea and vomiting.  Genitourinary: Negative for difficulty urinating and dysuria.  Musculoskeletal: Negative for back pain and joint swelling.  Skin: Negative for color change and rash.  Neurological: Negative for dizziness, light-headedness and headaches.  Psychiatric/Behavioral: Negative for agitation and dysphoric mood.       Objective:    Physical Exam  Constitutional: She appears well-developed and well-nourished. No distress.  HENT:  Nose: Nose normal.  Mouth/Throat: Oropharynx is clear and moist.  Neck: Neck supple. No thyromegaly present.  Cardiovascular: Normal rate and regular rhythm.   Pulmonary/Chest: Breath sounds normal. No respiratory distress. She has no wheezes.  Abdominal: Soft. Bowel sounds are normal. There is no tenderness.  Musculoskeletal: She exhibits no edema or tenderness.  Lymphadenopathy:    She has no cervical adenopathy.  Skin: No rash noted. No erythema.  Psychiatric: She has a normal mood and affect. Her behavior is normal.    BP 128/84 (BP Location: Left Arm, Patient Position: Sitting, Cuff Size: Normal)   Pulse 84   Temp 98.6 F (37 C) (Oral)   Resp 12   Ht 5' (1.524 m)   Wt 132 lb 3.2 oz (60 kg)   LMP 08/29/1995   SpO2 97%   BMI 25.82 kg/m  Wt Readings from Last 3 Encounters:  02/07/17 132 lb 3.2 oz (60 kg)  12/31/16 131 lb (59.4 kg)  11/22/16 130 lb (59 kg)     Lab Results  Component Value Date   WBC 5.0 09/19/2016   HGB 12.0 09/19/2016   HCT 36.2 09/19/2016   PLT 195.0 09/19/2016   GLUCOSE  101 (H) 09/19/2016   CHOL 207 (H) 09/19/2016   TRIG 102.0 09/19/2016   HDL 62.30 09/19/2016   LDLDIRECT 115.0 09/04/2012   LDLCALC 125 (H) 09/19/2016   ALT 15 09/19/2016   AST 21 09/19/2016   NA 141 09/19/2016   K 4.2 09/19/2016   CL 103 09/19/2016   CREATININE 0.98 09/19/2016   BUN 25 (H) 09/19/2016   CO2 32 09/19/2016   TSH 1.39 05/21/2016    Mm Digital Screening  Result Date: 05/09/2016 CLINICAL DATA:  Screening. EXAM: DIGITAL SCREENING BILATERAL MAMMOGRAM WITH CAD COMPARISON:  Previous exam(s). ACR Breast Density Category b: There are scattered areas of fibroglandular density. FINDINGS: There are no findings suspicious for  malignancy. Images were processed with CAD. IMPRESSION: No mammographic evidence of malignancy. A result letter of this screening mammogram will be mailed directly to the patient. RECOMMENDATION: Screening mammogram in one year. (Code:SM-B-01Y) BI-RADS CATEGORY  1: Negative. Electronically Signed   By: Franki Cabot M.D.   On: 05/09/2016 16:34       Assessment & Plan:   Problem List Items Addressed This Visit    Anxiety    Stable on prozac.  Follow.        Asthma    Just saw Dr Alva Garnet 12/2016.  Stable.  Follow.        Chest pain - Primary    EKG - SR with no acute ischemic changes.  Given persistent pain, refer to cardiology.  She request to see Dr Duffy Rhody or Dr Lorrene Reid.        Relevant Orders   EKG 12-Lead (Completed)   Ambulatory referral to Cardiology   Ambulatory referral to Cardiology   Essential hypertension    Blood pressure under good control.  Continue same medication regimen.  Follow pressures.  Follow metabolic panel.        Relevant Orders   Basic metabolic panel   GERD (gastroesophageal reflux disease)    Doing well on prilosec.  Follow.        Hypercholesterolemia    Low cholesterol diet and exercise.  Follow lipid panel.        Relevant Orders   Hepatic function panel   Lipid panel       Einar Pheasant,  MD

## 2017-02-09 ENCOUNTER — Encounter: Payer: Self-pay | Admitting: Internal Medicine

## 2017-02-09 DIAGNOSIS — J45909 Unspecified asthma, uncomplicated: Secondary | ICD-10-CM | POA: Insufficient documentation

## 2017-02-09 DIAGNOSIS — R079 Chest pain, unspecified: Secondary | ICD-10-CM | POA: Insufficient documentation

## 2017-02-09 NOTE — Assessment & Plan Note (Signed)
Doing well on prilosec.  Follow.

## 2017-02-09 NOTE — Assessment & Plan Note (Signed)
Blood pressure under good control.  Continue same medication regimen.  Follow pressures.  Follow metabolic panel.   

## 2017-02-09 NOTE — Assessment & Plan Note (Signed)
Low cholesterol diet and exercise.  Follow lipid panel.   

## 2017-02-09 NOTE — Assessment & Plan Note (Signed)
Just saw Dr Alva Garnet 12/2016.  Stable.  Follow.

## 2017-02-09 NOTE — Assessment & Plan Note (Signed)
EKG - SR with no acute ischemic changes.  Given persistent pain, refer to cardiology.  She request to see Dr Duffy Rhody or Dr Lorrene Reid.

## 2017-02-09 NOTE — Assessment & Plan Note (Signed)
Stable on prozac.  Follow.

## 2017-02-12 ENCOUNTER — Other Ambulatory Visit (INDEPENDENT_AMBULATORY_CARE_PROVIDER_SITE_OTHER): Payer: PPO

## 2017-02-12 DIAGNOSIS — E7800 Pure hypercholesterolemia, unspecified: Secondary | ICD-10-CM

## 2017-02-12 DIAGNOSIS — I10 Essential (primary) hypertension: Secondary | ICD-10-CM

## 2017-02-12 LAB — HEPATIC FUNCTION PANEL
ALT: 11 U/L (ref 0–35)
AST: 16 U/L (ref 0–37)
Albumin: 4 g/dL (ref 3.5–5.2)
Alkaline Phosphatase: 80 U/L (ref 39–117)
BILIRUBIN DIRECT: 0.1 mg/dL (ref 0.0–0.3)
BILIRUBIN TOTAL: 0.4 mg/dL (ref 0.2–1.2)
Total Protein: 7.2 g/dL (ref 6.0–8.3)

## 2017-02-12 LAB — LIPID PANEL
CHOL/HDL RATIO: 3
CHOLESTEROL: 171 mg/dL (ref 0–200)
HDL: 60.4 mg/dL (ref >39.00)
LDL CALC: 88 mg/dL (ref 0–99)
NonHDL: 111.08
Triglycerides: 117 mg/dL (ref 0.0–149.0)
VLDL: 23.4 mg/dL (ref 0.0–40.0)

## 2017-02-12 LAB — BASIC METABOLIC PANEL
BUN: 18 mg/dL (ref 6–23)
CALCIUM: 9.4 mg/dL (ref 8.4–10.5)
CO2: 34 mEq/L — ABNORMAL HIGH (ref 19–32)
Chloride: 104 mEq/L (ref 96–112)
Creatinine, Ser: 0.95 mg/dL (ref 0.40–1.20)
GFR: 60.55 mL/min (ref >60.00)
GLUCOSE: 90 mg/dL (ref 70–99)
Potassium: 4 mEq/L (ref 3.5–5.1)
SODIUM: 141 meq/L (ref 135–145)

## 2017-02-21 DIAGNOSIS — R079 Chest pain, unspecified: Secondary | ICD-10-CM | POA: Diagnosis not present

## 2017-02-21 DIAGNOSIS — E7800 Pure hypercholesterolemia, unspecified: Secondary | ICD-10-CM | POA: Diagnosis not present

## 2017-02-21 DIAGNOSIS — I10 Essential (primary) hypertension: Secondary | ICD-10-CM | POA: Diagnosis not present

## 2017-02-22 ENCOUNTER — Other Ambulatory Visit: Payer: Self-pay | Admitting: Internal Medicine

## 2017-02-27 DIAGNOSIS — M5134 Other intervertebral disc degeneration, thoracic region: Secondary | ICD-10-CM | POA: Diagnosis not present

## 2017-02-27 DIAGNOSIS — M5416 Radiculopathy, lumbar region: Secondary | ICD-10-CM | POA: Diagnosis not present

## 2017-02-27 DIAGNOSIS — M9902 Segmental and somatic dysfunction of thoracic region: Secondary | ICD-10-CM | POA: Diagnosis not present

## 2017-02-27 DIAGNOSIS — M9903 Segmental and somatic dysfunction of lumbar region: Secondary | ICD-10-CM | POA: Diagnosis not present

## 2017-03-18 DIAGNOSIS — I10 Essential (primary) hypertension: Secondary | ICD-10-CM | POA: Diagnosis not present

## 2017-03-18 DIAGNOSIS — R079 Chest pain, unspecified: Secondary | ICD-10-CM | POA: Diagnosis not present

## 2017-03-18 DIAGNOSIS — E7800 Pure hypercholesterolemia, unspecified: Secondary | ICD-10-CM | POA: Diagnosis not present

## 2017-03-20 DIAGNOSIS — M5134 Other intervertebral disc degeneration, thoracic region: Secondary | ICD-10-CM | POA: Diagnosis not present

## 2017-03-20 DIAGNOSIS — M9903 Segmental and somatic dysfunction of lumbar region: Secondary | ICD-10-CM | POA: Diagnosis not present

## 2017-03-20 DIAGNOSIS — M9902 Segmental and somatic dysfunction of thoracic region: Secondary | ICD-10-CM | POA: Diagnosis not present

## 2017-03-20 DIAGNOSIS — M5416 Radiculopathy, lumbar region: Secondary | ICD-10-CM | POA: Diagnosis not present

## 2017-04-03 ENCOUNTER — Ambulatory Visit: Payer: PPO | Admitting: Pulmonary Disease

## 2017-04-11 ENCOUNTER — Ambulatory Visit (INDEPENDENT_AMBULATORY_CARE_PROVIDER_SITE_OTHER): Payer: PPO | Admitting: Internal Medicine

## 2017-04-11 ENCOUNTER — Encounter: Payer: Self-pay | Admitting: Internal Medicine

## 2017-04-11 ENCOUNTER — Ambulatory Visit (INDEPENDENT_AMBULATORY_CARE_PROVIDER_SITE_OTHER): Payer: PPO

## 2017-04-11 ENCOUNTER — Ambulatory Visit: Payer: PPO

## 2017-04-11 VITALS — BP 140/78 | HR 88 | Temp 98.0°F | Resp 20 | Ht 59.84 in | Wt 133.4 lb

## 2017-04-11 DIAGNOSIS — I10 Essential (primary) hypertension: Secondary | ICD-10-CM

## 2017-04-11 DIAGNOSIS — Z1231 Encounter for screening mammogram for malignant neoplasm of breast: Secondary | ICD-10-CM

## 2017-04-11 DIAGNOSIS — F419 Anxiety disorder, unspecified: Secondary | ICD-10-CM

## 2017-04-11 DIAGNOSIS — R079 Chest pain, unspecified: Secondary | ICD-10-CM | POA: Diagnosis not present

## 2017-04-11 DIAGNOSIS — E7800 Pure hypercholesterolemia, unspecified: Secondary | ICD-10-CM | POA: Diagnosis not present

## 2017-04-11 DIAGNOSIS — M542 Cervicalgia: Secondary | ICD-10-CM | POA: Diagnosis not present

## 2017-04-11 DIAGNOSIS — K219 Gastro-esophageal reflux disease without esophagitis: Secondary | ICD-10-CM

## 2017-04-11 DIAGNOSIS — J4520 Mild intermittent asthma, uncomplicated: Secondary | ICD-10-CM | POA: Diagnosis not present

## 2017-04-11 DIAGNOSIS — M4802 Spinal stenosis, cervical region: Secondary | ICD-10-CM | POA: Diagnosis not present

## 2017-04-11 DIAGNOSIS — Z1239 Encounter for other screening for malignant neoplasm of breast: Secondary | ICD-10-CM

## 2017-04-11 MED ORDER — IRBESARTAN 75 MG PO TABS: 75.0000 mg | ORAL_TABLET | Freq: Every day | ORAL | 1 refills | Status: DC

## 2017-04-11 NOTE — Progress Notes (Signed)
Patient ID: Patricia Pacheco, female   DOB: 08/02/1940, 76 y.o.   MRN: [id number]   Subjective:    Patient ID: Patricia Pacheco, female    DOB: [birth year], 76 y.o.   MRN: [id number]  HPI  Patient here for a scheduled follow up.   She has recently been evaluated by cardiology.  Last seen 03/18/17.  Note reviewed.  Had recent stress test.  They adjusted her medication.  Decreased her amlodipine to 5mg  q day.  Instructed to start losartan.  She never started the medication.  She has been taking her blood pressure.  Blood pressures recently averaging 110-130/60-70s.  No chest pain reported.  Breathing stable.  No increased sob.  No acid reflux.  No abdominal pain.  Bowels moving.  She did not start cholesterol medication.  She is having persistent neck pain.  Increased pain and pain extending into upper left shoulder, left anterior chest and axilla.  Constant, but worse at times.  No increased pain with deep breathing.     Past Medical History:  Diagnosis Date  . Allergy    allergy shots (Juengel)  . Arthritis   . Asthma   . CAD (coronary artery disease)    cath 11/27/04 - moderated LAD disease  . Cancer ([place])    skin  . GERD (gastroesophageal reflux disease)    EGD (05/25/04) - slight presbyesophagus, mild esophagitis, mild gastritis, mild duodenitis - positive H. pylori.   Marland Kitchen Neuropathy    Past Surgical History:  Procedure Laterality Date  . APPENDECTOMY  1967   Family History  Problem Relation Age of Onset  . Arthritis Mother   . Colon polyps Sister    Social History   Social History  . Marital status: Married    Spouse name: N/A  . Number of children: N/A  . Years of education: N/A   Social History Main Topics  . Smoking status: Never Smoker  . Smokeless tobacco: Never Used  . Alcohol use No  . Drug use: No  . Sexual activity: Not Asked   Other Topics Concern  . None   Social History Narrative  . None    Outpatient Encounter Prescriptions as of 04/11/2017  Medication Sig  .  ALPRAZolam (XANAX) 0.25 MG tablet Take 1 tablet (0.25 mg total) by mouth daily as needed. for anxiety  . antiseptic oral rinse (BIOTENE) LIQD 15 mLs by Mouth Rinse route as needed for dry mouth.  Marland Kitchen aspirin 81 MG tablet Take 81 mg by mouth daily.  . Calcium Carbonate-Vit D-Min (CALCIUM 1200 PO) Take by mouth daily.  . COD LIVER OIL PO Take by mouth.  . Coenzyme Q10 (CO Q-10) 100 MG CAPS Take 1 capsule by mouth daily.  Marland Kitchen docusate sodium (COLACE) 100 MG capsule Take by mouth.  . fexofenadine (ALLEGRA) 180 MG tablet Take 1 tablet (180 mg total) by mouth daily as needed for allergies or rhinitis.  . fluocinolone (SYNALAR) 0.01 % external solution Apply topically 2 (two) times daily.  Marland Kitchen FLUoxetine (PROZAC) 10 MG capsule TAKE 1 CAPSULE BY MOUTH DAILY  . mometasone (ELOCON) 0.1 % lotion Apply 1 application topically as directed.  . montelukast (SINGULAIR) 10 MG tablet Take 10 mg by mouth at bedtime.  . Multiple Vitamin (MULTIVITAMIN) tablet Take 1 tablet by mouth daily.  . mupirocin ointment (BACTROBAN) 2 % Place 1 application into the nose 2 (two) times daily.  . Naproxen Sodium (ALEVE PO) Take by mouth as needed.  . Omega-3 Fatty Acids (FISH  OIL PO) Take by mouth daily.  Marland Kitchen omeprazole (PRILOSEC) 40 MG capsule TAKE 1 CAPSULE EVERY DAY  . silver sulfADIAZINE (SILVADENE) 1 % cream Apply 1 application topically daily.  Marland Kitchen triamcinolone (KENALOG) 0.025 % cream Apply 1 application topically 2 (two) times daily.  Marland Kitchen UNABLE TO FIND   . [DISCONTINUED] amLODipine (NORVASC) 5 MG tablet Take 1 tablet (5 mg total) by mouth daily.  . irbesartan (AVAPRO) 75 MG tablet Take 1 tablet (75 mg total) by mouth daily.  . [DISCONTINUED] albuterol (PROVENTIL HFA;VENTOLIN HFA) 108 (90 Base) MCG/ACT inhaler Inhale 1-2 puffs into the lungs every 4 (four) hours as needed for wheezing or shortness of breath.  . [DISCONTINUED] beclomethasone (QVAR) 80 MCG/ACT inhaler Inhale 2 puffs into the lungs 2 (two) times daily.  .  [DISCONTINUED] fluticasone (FLONASE) 50 MCG/ACT nasal spray Place 1 spray into both nostrils daily as needed.    No facility-administered encounter medications on file as of 04/11/2017.     Review of Systems  Constitutional: Negative for appetite change and unexpected weight change.  HENT: Negative for congestion and sinus pressure.   Respiratory: Negative for cough, chest tightness and shortness of breath.   Cardiovascular: Positive for chest pain. Negative for palpitations and leg swelling.  Gastrointestinal: Negative for abdominal pain, diarrhea, nausea and vomiting.  Genitourinary: Negative for difficulty urinating and dysuria.  Musculoskeletal: Positive for neck pain. Negative for joint swelling.  Skin: Negative for color change and rash.  Neurological: Negative for dizziness, light-headedness and headaches.  Psychiatric/Behavioral: Negative for agitation and dysphoric mood.       Objective:     Blood pressure rechecked by me:  138-140/78  Physical Exam  Constitutional: She appears well-developed and well-nourished. No distress.  HENT:  Nose: Nose normal.  Mouth/Throat: Oropharynx is clear and moist.  Neck: Neck supple. No thyromegaly present.  Cardiovascular: Normal rate and regular rhythm.   Pulmonary/Chest: Breath sounds normal. No respiratory distress. She has no wheezes.  Abdominal: Soft. Bowel sounds are normal. There is no tenderness.  Musculoskeletal: She exhibits no edema or tenderness.  Some increased discomfort with looking from side to side.  No pain with abduction.    Lymphadenopathy:    She has no cervical adenopathy.  Skin: No rash noted. No erythema.  Psychiatric: She has a normal mood and affect. Her behavior is normal.    BP 140/78   Pulse 88   Temp 98 F (36.7 C) (Oral)   Resp 20   Ht 4' 11.84" (1.52 m)   Wt 133 lb 6.4 oz (60.5 kg)   LMP 08/29/1995   SpO2 96%   BMI 26.19 kg/m  Wt Readings from Last 3 Encounters:  04/11/17 133 lb 6.4 oz (60.5  kg)  02/07/17 132 lb 3.2 oz (60 kg)  12/31/16 131 lb (59.4 kg)     Lab Results  Component Value Date   WBC 5.0 09/19/2016   HGB 12.0 09/19/2016   HCT 36.2 09/19/2016   PLT 195.0 09/19/2016   GLUCOSE 90 02/12/2017   CHOL 171 02/12/2017   TRIG 117.0 02/12/2017   HDL 60.40 02/12/2017   LDLDIRECT 115.0 09/04/2012   LDLCALC 88 02/12/2017   ALT 11 02/12/2017   AST 16 02/12/2017   NA 141 02/12/2017   K 4.0 02/12/2017   CL 104 02/12/2017   CREATININE 0.95 02/12/2017   BUN 18 02/12/2017   CO2 34 (H) 02/12/2017   TSH 1.39 05/21/2016    Mm Digital Screening  Result Date: 05/09/2016 CLINICAL DATA:  Screening. EXAM: DIGITAL SCREENING BILATERAL MAMMOGRAM WITH CAD COMPARISON:  Previous exam(s). ACR Breast Density Category b: There are scattered areas of fibroglandular density. FINDINGS: There are no findings suspicious for malignancy. Images were processed with CAD. IMPRESSION: No mammographic evidence of malignancy. A result letter of this screening mammogram will be mailed directly to the patient. RECOMMENDATION: Screening mammogram in one year. (Code:SM-B-01Y) BI-RADS CATEGORY  1: Negative. Electronically Signed   By: Franki Cabot M.D.   On: 05/09/2016 16:34       Assessment & Plan:   Problem List Items Addressed This Visit    Anxiety    Stable on prozac.        Asthma    Sees Dr Alva Garnet.  Breathing stable.        Chest pain    Saw cardiology.  Had w/up as outlined.  With persistent pain.  Reports increased neck pian, chest pain and axillary pain.  Exam reveals concern over possible neck etiology.  Will check c-spine xray.        Essential hypertension    Blood pressure elevated.  On initial check here.  Recheck here improved.  Her checks under good control.  She is not tolerating amlodipine.  (even the 5mg  dose).  She had cough with lisinopril.  Had diarrhea with losartan.  Will try another ARB.  Start avapro 75mg  q day.  Have her spot check her pressure.  Increase as  tolerated.  Get her back in soon to reassess.        Relevant Medications   irbesartan (AVAPRO) 75 MG tablet   GERD (gastroesophageal reflux disease)    Doing well on prilosec.        Hypercholesterolemia    Low cholesterol diet and exercise.  Did not start crestor.  Desires not to start at this time.  Follow lipid panel.  Low cholesterol diet and exercise.        Relevant Medications   irbesartan (AVAPRO) 75 MG tablet    Other Visit Diagnoses    Screening for breast cancer    -  Primary   Relevant Orders   MM DIGITAL SCREENING BILATERAL   Neck pain       Relevant Orders   DG Cervical Spine 2 or 3 views (Completed)       Einar Pheasant, MD

## 2017-04-12 ENCOUNTER — Telehealth: Payer: Self-pay | Admitting: Internal Medicine

## 2017-04-12 NOTE — Telephone Encounter (Signed)
Pt called back returning your call. Please advise, thank you!  Call pt @ [phone]

## 2017-04-12 NOTE — Telephone Encounter (Signed)
See result note.  

## 2017-04-14 ENCOUNTER — Other Ambulatory Visit: Payer: Self-pay | Admitting: Internal Medicine

## 2017-04-14 ENCOUNTER — Encounter: Payer: Self-pay | Admitting: Internal Medicine

## 2017-04-14 DIAGNOSIS — M542 Cervicalgia: Secondary | ICD-10-CM

## 2017-04-14 NOTE — Assessment & Plan Note (Signed)
Stable on prozac.  

## 2017-04-14 NOTE — Progress Notes (Signed)
Order placed for MRI c-spine.

## 2017-04-14 NOTE — Assessment & Plan Note (Signed)
Doing well on prilosec

## 2017-04-14 NOTE — Assessment & Plan Note (Signed)
Blood pressure elevated.  On initial check here.  Recheck here improved.  Her checks under good control.  She is not tolerating amlodipine.  (even the 5mg  dose).  She had cough with lisinopril.  Had diarrhea with losartan.  Will try another ARB.  Start avapro 75mg  q day.  Have her spot check her pressure.  Increase as tolerated.  Get her back in soon to reassess.

## 2017-04-14 NOTE — Assessment & Plan Note (Signed)
Saw cardiology.  Had w/up as outlined.  With persistent pain.  Reports increased neck pian, chest pain and axillary pain.  Exam reveals concern over possible neck etiology.  Will check c-spine xray.

## 2017-04-14 NOTE — Assessment & Plan Note (Signed)
Sees Dr Simonds.  Breathing stable.   

## 2017-04-14 NOTE — Assessment & Plan Note (Signed)
Low cholesterol diet and exercise.  Did not start crestor.  Desires not to start at this time.  Follow lipid panel.  Low cholesterol diet and exercise.

## 2017-04-17 DIAGNOSIS — M9903 Segmental and somatic dysfunction of lumbar region: Secondary | ICD-10-CM | POA: Diagnosis not present

## 2017-04-17 DIAGNOSIS — M5134 Other intervertebral disc degeneration, thoracic region: Secondary | ICD-10-CM | POA: Diagnosis not present

## 2017-04-17 DIAGNOSIS — M5416 Radiculopathy, lumbar region: Secondary | ICD-10-CM | POA: Diagnosis not present

## 2017-04-17 DIAGNOSIS — M9902 Segmental and somatic dysfunction of thoracic region: Secondary | ICD-10-CM | POA: Diagnosis not present

## 2017-04-18 ENCOUNTER — Other Ambulatory Visit: Payer: Self-pay | Admitting: Internal Medicine

## 2017-04-19 ENCOUNTER — Other Ambulatory Visit: Payer: Self-pay | Admitting: Internal Medicine

## 2017-04-27 ENCOUNTER — Ambulatory Visit
Admission: RE | Admit: 2017-04-27 | Discharge: 2017-04-27 | Disposition: A | Discharge status: Home / Self Care | Payer: PPO | Source: Ambulatory Visit | Attending: Internal Medicine | Admitting: Internal Medicine

## 2017-04-27 DIAGNOSIS — M47812 Spondylosis without myelopathy or radiculopathy, cervical region: Secondary | ICD-10-CM | POA: Insufficient documentation

## 2017-04-27 DIAGNOSIS — M4802 Spinal stenosis, cervical region: Secondary | ICD-10-CM | POA: Diagnosis not present

## 2017-04-27 DIAGNOSIS — M8938 Hypertrophy of bone, other site: Secondary | ICD-10-CM | POA: Diagnosis not present

## 2017-04-27 DIAGNOSIS — M542 Cervicalgia: Secondary | ICD-10-CM

## 2017-04-29 ENCOUNTER — Other Ambulatory Visit: Payer: Self-pay | Admitting: Internal Medicine

## 2017-04-29 DIAGNOSIS — M542 Cervicalgia: Secondary | ICD-10-CM

## 2017-04-29 NOTE — Progress Notes (Signed)
Orders placed for neurosurgery referral.

## 2017-04-30 ENCOUNTER — Other Ambulatory Visit: Payer: Self-pay | Admitting: Internal Medicine

## 2017-04-30 DIAGNOSIS — M542 Cervicalgia: Secondary | ICD-10-CM

## 2017-04-30 DIAGNOSIS — M25512 Pain in left shoulder: Secondary | ICD-10-CM

## 2017-04-30 NOTE — Progress Notes (Signed)
Opened in error

## 2017-05-09 DIAGNOSIS — M545 Low back pain: Secondary | ICD-10-CM | POA: Diagnosis not present

## 2017-05-09 DIAGNOSIS — M4802 Spinal stenosis, cervical region: Secondary | ICD-10-CM | POA: Diagnosis not present

## 2017-05-09 DIAGNOSIS — M25512 Pain in left shoulder: Secondary | ICD-10-CM | POA: Diagnosis not present

## 2017-05-09 DIAGNOSIS — G8929 Other chronic pain: Secondary | ICD-10-CM | POA: Diagnosis not present

## 2017-05-22 ENCOUNTER — Other Ambulatory Visit: Payer: Self-pay | Admitting: Internal Medicine

## 2017-05-22 DIAGNOSIS — M79602 Pain in left arm: Secondary | ICD-10-CM | POA: Diagnosis not present

## 2017-05-24 NOTE — Telephone Encounter (Signed)
ok'd refill for xanax #30 with no refills.   

## 2017-05-24 NOTE — Telephone Encounter (Signed)
last ov 04/05/17 last refill 04/19/17 30 1  refill.

## 2017-05-28 ENCOUNTER — Ambulatory Visit
Admission: RE | Admit: 2017-05-28 | Discharge: 2017-05-28 | Disposition: A | Discharge status: Home / Self Care | Payer: PPO | Source: Ambulatory Visit | Attending: Internal Medicine | Admitting: Internal Medicine

## 2017-05-28 DIAGNOSIS — Z1231 Encounter for screening mammogram for malignant neoplasm of breast: Secondary | ICD-10-CM | POA: Diagnosis not present

## 2017-05-28 DIAGNOSIS — Z1239 Encounter for other screening for malignant neoplasm of breast: Secondary | ICD-10-CM

## 2017-06-26 DIAGNOSIS — M5416 Radiculopathy, lumbar region: Secondary | ICD-10-CM | POA: Diagnosis not present

## 2017-06-26 DIAGNOSIS — M9902 Segmental and somatic dysfunction of thoracic region: Secondary | ICD-10-CM | POA: Diagnosis not present

## 2017-06-26 DIAGNOSIS — M5134 Other intervertebral disc degeneration, thoracic region: Secondary | ICD-10-CM | POA: Diagnosis not present

## 2017-06-26 DIAGNOSIS — M9903 Segmental and somatic dysfunction of lumbar region: Secondary | ICD-10-CM | POA: Diagnosis not present

## 2017-07-01 ENCOUNTER — Ambulatory Visit (INDEPENDENT_AMBULATORY_CARE_PROVIDER_SITE_OTHER): Payer: PPO | Admitting: Internal Medicine

## 2017-07-01 ENCOUNTER — Encounter: Payer: Self-pay | Admitting: Internal Medicine

## 2017-07-01 DIAGNOSIS — Z9109 Other allergy status, other than to drugs and biological substances: Secondary | ICD-10-CM | POA: Diagnosis not present

## 2017-07-01 DIAGNOSIS — Z23 Encounter for immunization: Secondary | ICD-10-CM | POA: Diagnosis not present

## 2017-07-01 DIAGNOSIS — M25512 Pain in left shoulder: Secondary | ICD-10-CM | POA: Diagnosis not present

## 2017-07-01 DIAGNOSIS — K219 Gastro-esophageal reflux disease without esophagitis: Secondary | ICD-10-CM | POA: Diagnosis not present

## 2017-07-01 DIAGNOSIS — E7800 Pure hypercholesterolemia, unspecified: Secondary | ICD-10-CM

## 2017-07-01 DIAGNOSIS — F419 Anxiety disorder, unspecified: Secondary | ICD-10-CM

## 2017-07-01 DIAGNOSIS — I10 Essential (primary) hypertension: Secondary | ICD-10-CM

## 2017-07-01 DIAGNOSIS — L989 Disorder of the skin and subcutaneous tissue, unspecified: Secondary | ICD-10-CM | POA: Diagnosis not present

## 2017-07-01 DIAGNOSIS — J4520 Mild intermittent asthma, uncomplicated: Secondary | ICD-10-CM

## 2017-07-01 NOTE — Progress Notes (Signed)
Patient ID: Patricia Pacheco, female   DOB: [birth year], 77 y.o.   MRN: [id number]   Subjective:    Patient ID: Patricia Pacheco, female    DOB: 1940-12-27, 77 y.o.   MRN: [id number]  HPI  Patient here for a scheduled follow up.  She has been having pain - left shoulder, neck and low back.  Had NCS.  Saw Dr Cari Caraway.  EMG results normal.  He wanted to refer her to ortho for evaluation for shoulder.  We discussed this today.  Agree with referral.  She plans to contact Dr Nelly Laurence office for referral.  Previously saw cardiology.  Had stress test.  They decreased her amlodipine.  Has had problems tolerating blood pressure medication.  Had cough with lisinopril.  Reports diarrhea with losartan.  We had discussed starting avapro.  She reports she never started.  Went back on losartan.  States is causing some ankle/feet swelling.  Reviewed her blood pressures have been averaging 127-137/60-70s. No chest pain.  No sob.  No acid reflux.  No abdominal pain.     Past Medical History:  Diagnosis Date  . Allergy    allergy shots (Juengel)  . Arthritis   . Asthma   . CAD (coronary artery disease)    cath 11/27/04 - moderated LAD disease  . Cancer ([place])    skin  . GERD (gastroesophageal reflux disease)    EGD (05/25/04) - slight presbyesophagus, mild esophagitis, mild gastritis, mild duodenitis - positive H. pylori.   Marland Kitchen Neuropathy    Past Surgical History:  Procedure Laterality Date  . APPENDECTOMY  1967   Family History  Problem Relation Age of Onset  . Arthritis Mother   . Colon polyps Sister   . Breast cancer Neg Hx    Social History   Socioeconomic History  . Marital status: Married    Spouse name: None  . Number of children: None  . Years of education: None  . Highest education level: None  Social Needs  . Financial resource strain: None  . Food insecurity - worry: None  . Food insecurity - inability: None  . Transportation needs - medical: None  . Transportation needs - non-medical:  None  Occupational History  . None  Tobacco Use  . Smoking status: Never Smoker  . Smokeless tobacco: Never Used  Substance and Sexual Activity  . Alcohol use: No    Alcohol/week: 0.0 oz  . Drug use: No  . Sexual activity: None  Other Topics Concern  . None  Social History Narrative  . None    Outpatient Encounter Medications as of 07/01/2017  Medication Sig  . ALPRAZolam (XANAX) 0.25 MG tablet TAKE ONE TABLET EVERY DAY AS NEEDED FOR ANXIETY  . antiseptic oral rinse (BIOTENE) LIQD 15 mLs by Mouth Rinse route as needed for dry mouth.  Marland Kitchen aspirin 81 MG tablet Take 81 mg by mouth daily.  . Calcium Carbonate-Vit D-Min (CALCIUM 1200 PO) Take by mouth daily.  . COD LIVER OIL PO Take by mouth.  . Coenzyme Q10 (CO Q-10) 100 MG CAPS Take 1 capsule by mouth daily.  Marland Kitchen docusate sodium (COLACE) 100 MG capsule Take by mouth.  . fexofenadine (ALLEGRA) 180 MG tablet Take 1 tablet (180 mg total) by mouth daily as needed for allergies or rhinitis.  . fluocinolone (SYNALAR) 0.01 % external solution Apply topically 2 (two) times daily.  Marland Kitchen FLUoxetine (PROZAC) 10 MG capsule TAKE 1 CAPSULE BY MOUTH EVERY DAY  . irbesartan (AVAPRO) 75 MG  tablet Take 1 tablet (75 mg total) by mouth daily.  . mometasone (ELOCON) 0.1 % lotion Apply 1 application topically as directed.  . montelukast (SINGULAIR) 10 MG tablet Take 10 mg by mouth at bedtime.  . Multiple Vitamin (MULTIVITAMIN) tablet Take 1 tablet by mouth daily.  . mupirocin ointment (BACTROBAN) 2 % Place 1 application into the nose 2 (two) times daily.  . Omega-3 Fatty Acids (FISH OIL PO) Take by mouth daily.  Marland Kitchen omeprazole (PRILOSEC) 40 MG capsule TAKE 1 CAPSULE BY MOUTH EVERY DAY  . silver sulfADIAZINE (SILVADENE) 1 % cream Apply 1 application topically daily.  Marland Kitchen triamcinolone (KENALOG) 0.025 % cream Apply 1 application topically 2 (two) times daily.  Marland Kitchen UNABLE TO FIND   . [DISCONTINUED] Naproxen Sodium (ALEVE PO) Take by mouth as needed.   No  facility-administered encounter medications on file as of 07/01/2017.     Review of Systems  Constitutional: Negative for appetite change and unexpected weight change.  HENT: Negative for congestion and sinus pressure.   Respiratory: Negative for cough, chest tightness and shortness of breath.   Cardiovascular: Negative for chest pain, palpitations and leg swelling.  Gastrointestinal: Negative for abdominal pain, diarrhea, nausea and vomiting.  Genitourinary: Negative for difficulty urinating and dysuria.  Musculoskeletal:       Persistent neck and shoulder pain as outlined.    Skin: Negative for color change and rash.  Neurological: Negative for dizziness, light-headedness and headaches.  Psychiatric/Behavioral: Negative for agitation and dysphoric mood.       Objective:    Physical Exam  Constitutional: She appears well-developed and well-nourished. No distress.  HENT:  Nose: Nose normal.  Mouth/Throat: Oropharynx is clear and moist.  Neck: Neck supple. No thyromegaly present.  Cardiovascular: Normal rate and regular rhythm.  Pulmonary/Chest: Breath sounds normal. No respiratory distress. She has no wheezes.  Abdominal: Soft. Bowel sounds are normal. There is no tenderness.  Musculoskeletal: She exhibits no edema or tenderness.  Lymphadenopathy:    She has no cervical adenopathy.  Skin: Skin is warm and dry. No erythema.  Psychiatric: She has a normal mood and affect. Her behavior is normal.    BP (!) 174/84   Pulse 71   Temp 97.8 F (36.6 C) (Oral)   Ht 4\' 11"  (1.499 m)   Wt 132 lb 9.6 oz (60.1 kg)   LMP 08/29/1995   SpO2 98%   BMI 26.78 kg/m  Wt Readings from Last 3 Encounters:  07/01/17 132 lb 9.6 oz (60.1 kg)  04/11/17 133 lb 6.4 oz (60.5 kg)  02/07/17 132 lb 3.2 oz (60 kg)     Lab Results  Component Value Date   WBC 5.0 09/19/2016   HGB 12.0 09/19/2016   HCT 36.2 09/19/2016   PLT 195.0 09/19/2016   GLUCOSE 90 02/12/2017   CHOL 171 02/12/2017   TRIG  117.0 02/12/2017   HDL 60.40 02/12/2017   LDLDIRECT 115.0 09/04/2012   LDLCALC 88 02/12/2017   ALT 11 02/12/2017   AST 16 02/12/2017   NA 141 02/12/2017   K 4.0 02/12/2017   CL 104 02/12/2017   CREATININE 0.95 02/12/2017   BUN 18 02/12/2017   CO2 34 (H) 02/12/2017   TSH 1.39 05/21/2016    Mm Screening Breast Tomo Bilateral  Result Date: 05/28/2017 CLINICAL DATA:  Screening. EXAM: 2D DIGITAL SCREENING BILATERAL MAMMOGRAM WITH CAD AND ADJUNCT TOMO COMPARISON:  Previous exam(s). ACR Breast Density Category b: There are scattered areas of fibroglandular density. FINDINGS: There are no  findings suspicious for malignancy. Images were processed with CAD. IMPRESSION: No mammographic evidence of malignancy. A result letter of this screening mammogram will be mailed directly to the patient. RECOMMENDATION: Screening mammogram in one year. (Code:SM-B-01Y) BI-RADS CATEGORY  1: Negative. Electronically Signed   By: Franki Cabot M.D.   On: 05/28/2017 13:34       Assessment & Plan:   Problem List Items Addressed This Visit    Anxiety    Stable on prozac.        Asthma    Has seen Dr Alva Garnet.  Breathing stable.       Environmental allergies    Has seen Dr Pryor Ochoa.  On singulair.  Using nasal spray.  Follow.        Essential hypertension    Blood pressure remains elevated.  Has had intolerance to amlodipine and losartan.  Cough with lisinopril.  Never tried avapro.  Trial of avapro.  Follow pressures.  Follow metabolic panel.        Relevant Orders   TSH   Basic metabolic panel   GERD (gastroesophageal reflux disease)    Appears to be doing well on prilosec.        Hypercholesterolemia    Did not tolerate crestor.  Desires not to start cholesterol medication.  Low cholesterol diet and exercise.  Follow lipid panel.        Relevant Orders   Lipid panel   Hepatic function panel   Shoulder pain, left    Persistent neck and shoulder pain.  Saw Dr Cari Caraway.  EMGs - normal.  Agree  with referral to ortho for her shoulder.        Skin lesions    Rash on lower legs. Sees Dermatology.  Plans f/u.            Einar Pheasant, MD

## 2017-07-01 NOTE — Progress Notes (Signed)
Pre visit review using our clinic review tool, if applicable. No additional management support is needed unless otherwise documented below in the visit note. 

## 2017-07-03 DIAGNOSIS — D485 Neoplasm of uncertain behavior of skin: Secondary | ICD-10-CM | POA: Diagnosis not present

## 2017-07-03 DIAGNOSIS — L853 Xerosis cutis: Secondary | ICD-10-CM | POA: Diagnosis not present

## 2017-07-03 DIAGNOSIS — L578 Other skin changes due to chronic exposure to nonionizing radiation: Secondary | ICD-10-CM | POA: Diagnosis not present

## 2017-07-03 DIAGNOSIS — Z859 Personal history of malignant neoplasm, unspecified: Secondary | ICD-10-CM | POA: Diagnosis not present

## 2017-07-03 DIAGNOSIS — L570 Actinic keratosis: Secondary | ICD-10-CM | POA: Diagnosis not present

## 2017-07-03 DIAGNOSIS — L2089 Other atopic dermatitis: Secondary | ICD-10-CM | POA: Diagnosis not present

## 2017-07-03 DIAGNOSIS — L821 Other seborrheic keratosis: Secondary | ICD-10-CM | POA: Diagnosis not present

## 2017-07-04 ENCOUNTER — Encounter: Payer: Self-pay | Admitting: Internal Medicine

## 2017-07-04 DIAGNOSIS — M25512 Pain in left shoulder: Secondary | ICD-10-CM | POA: Insufficient documentation

## 2017-07-04 NOTE — Assessment & Plan Note (Signed)
Persistent neck and shoulder pain.  Saw Dr Cari Caraway.  EMGs - normal.  Agree with referral to ortho for her shoulder.

## 2017-07-04 NOTE — Assessment & Plan Note (Signed)
Blood pressure remains elevated.  Has had intolerance to amlodipine and losartan.  Cough with lisinopril.  Never tried avapro.  Trial of avapro.  Follow pressures.  Follow metabolic panel.

## 2017-07-04 NOTE — Assessment & Plan Note (Signed)
Appears to be doing well on prilosec.

## 2017-07-04 NOTE — Assessment & Plan Note (Signed)
Did not tolerate crestor.  Desires not to start cholesterol medication.  Low cholesterol diet and exercise.  Follow lipid panel.

## 2017-07-04 NOTE — Assessment & Plan Note (Signed)
Has seen Dr Pryor Ochoa.  On singulair.  Using nasal spray.  Follow.

## 2017-07-04 NOTE — Assessment & Plan Note (Signed)
Has seen Dr Alva Garnet.  Breathing stable.

## 2017-07-04 NOTE — Assessment & Plan Note (Signed)
Stable on prozac.  

## 2017-07-04 NOTE — Assessment & Plan Note (Signed)
Rash on lower legs. Sees Dermatology.  Plans f/u.

## 2017-07-10 ENCOUNTER — Telehealth: Payer: Self-pay | Admitting: Internal Medicine

## 2017-07-10 NOTE — Telephone Encounter (Signed)
Phone number has been busy. Unable to leave VM

## 2017-07-10 NOTE — Telephone Encounter (Signed)
Please confirm no lip swelling, rash, etc.  Per notes, she has had problems with losartan, amlodipine and avapro (her current medication).  Please confirm if she has ever taken HCTZ (diuretic - blood pressure medication).  If no, then can start hctz 25mg  1/2 tablet per day.  Follow blood pressures and send in readings over the next 2-3 weeks.  May need to adjust medication more.

## 2017-07-10 NOTE — Telephone Encounter (Signed)
Copied from [place]. Topic: General - Other >> Jul 10, 2017  1:21 PM Darl Householder, RMA wrote: Reason for CRM: patient called to notify Dr. Nicki Reaper that medication Irbesartan 75 mg makes her very anxious and she cough and stomach discomfort when she takes it and wants to know if something different can be prescribed in place of this medication, please call pt

## 2017-07-10 NOTE — Telephone Encounter (Signed)
Please advise 

## 2017-07-11 MED ORDER — HYDROCHLOROTHIAZIDE 25 MG PO TABS: 12.5000 mg | ORAL_TABLET | Freq: Every day | ORAL | 1 refills | Status: DC

## 2017-07-11 NOTE — Telephone Encounter (Signed)
Patient has been informed of information.  Also patient did have a rash but she stated she has been seeing dermatology about it.  No swelling or a allergic reaction due to medication per patient.  She stated she will comply with directions on the HCTZ

## 2017-07-15 DIAGNOSIS — D485 Neoplasm of uncertain behavior of skin: Secondary | ICD-10-CM | POA: Diagnosis not present

## 2017-07-15 DIAGNOSIS — C4441 Basal cell carcinoma of skin of scalp and neck: Secondary | ICD-10-CM | POA: Diagnosis not present

## 2017-07-19 DIAGNOSIS — G8929 Other chronic pain: Secondary | ICD-10-CM | POA: Diagnosis not present

## 2017-07-19 DIAGNOSIS — M25512 Pain in left shoulder: Secondary | ICD-10-CM | POA: Diagnosis not present

## 2017-07-22 ENCOUNTER — Other Ambulatory Visit: Payer: Self-pay | Admitting: Internal Medicine

## 2017-07-24 DIAGNOSIS — M9903 Segmental and somatic dysfunction of lumbar region: Secondary | ICD-10-CM | POA: Diagnosis not present

## 2017-07-24 DIAGNOSIS — M9902 Segmental and somatic dysfunction of thoracic region: Secondary | ICD-10-CM | POA: Diagnosis not present

## 2017-07-24 DIAGNOSIS — M5134 Other intervertebral disc degeneration, thoracic region: Secondary | ICD-10-CM | POA: Diagnosis not present

## 2017-07-24 DIAGNOSIS — M5416 Radiculopathy, lumbar region: Secondary | ICD-10-CM | POA: Diagnosis not present

## 2017-07-31 DIAGNOSIS — C4441 Basal cell carcinoma of skin of scalp and neck: Secondary | ICD-10-CM | POA: Diagnosis not present

## 2017-08-16 DIAGNOSIS — H1851 Endothelial corneal dystrophy: Secondary | ICD-10-CM | POA: Diagnosis not present

## 2017-08-16 DIAGNOSIS — H43813 Vitreous degeneration, bilateral: Secondary | ICD-10-CM | POA: Diagnosis not present

## 2017-08-21 DIAGNOSIS — M9903 Segmental and somatic dysfunction of lumbar region: Secondary | ICD-10-CM | POA: Diagnosis not present

## 2017-08-21 DIAGNOSIS — M9902 Segmental and somatic dysfunction of thoracic region: Secondary | ICD-10-CM | POA: Diagnosis not present

## 2017-08-21 DIAGNOSIS — M5134 Other intervertebral disc degeneration, thoracic region: Secondary | ICD-10-CM | POA: Diagnosis not present

## 2017-08-21 DIAGNOSIS — M5416 Radiculopathy, lumbar region: Secondary | ICD-10-CM | POA: Diagnosis not present

## 2017-09-09 ENCOUNTER — Other Ambulatory Visit: Payer: Self-pay | Admitting: Internal Medicine

## 2017-09-18 DIAGNOSIS — M9903 Segmental and somatic dysfunction of lumbar region: Secondary | ICD-10-CM | POA: Diagnosis not present

## 2017-09-18 DIAGNOSIS — M5134 Other intervertebral disc degeneration, thoracic region: Secondary | ICD-10-CM | POA: Diagnosis not present

## 2017-09-18 DIAGNOSIS — M5416 Radiculopathy, lumbar region: Secondary | ICD-10-CM | POA: Diagnosis not present

## 2017-09-18 DIAGNOSIS — M9902 Segmental and somatic dysfunction of thoracic region: Secondary | ICD-10-CM | POA: Diagnosis not present

## 2017-09-25 ENCOUNTER — Encounter: Payer: Self-pay | Admitting: Internal Medicine

## 2017-09-25 ENCOUNTER — Ambulatory Visit (INDEPENDENT_AMBULATORY_CARE_PROVIDER_SITE_OTHER): Payer: PPO | Admitting: Internal Medicine

## 2017-09-25 VITALS — BP 144/80 | HR 81 | Temp 98.2°F | Resp 18 | Wt 134.8 lb

## 2017-09-25 DIAGNOSIS — L989 Disorder of the skin and subcutaneous tissue, unspecified: Secondary | ICD-10-CM | POA: Diagnosis not present

## 2017-09-25 DIAGNOSIS — K219 Gastro-esophageal reflux disease without esophagitis: Secondary | ICD-10-CM

## 2017-09-25 DIAGNOSIS — F419 Anxiety disorder, unspecified: Secondary | ICD-10-CM

## 2017-09-25 DIAGNOSIS — I10 Essential (primary) hypertension: Secondary | ICD-10-CM

## 2017-09-25 DIAGNOSIS — M25512 Pain in left shoulder: Secondary | ICD-10-CM

## 2017-09-25 DIAGNOSIS — E7800 Pure hypercholesterolemia, unspecified: Secondary | ICD-10-CM | POA: Diagnosis not present

## 2017-09-25 DIAGNOSIS — J4520 Mild intermittent asthma, uncomplicated: Secondary | ICD-10-CM | POA: Diagnosis not present

## 2017-09-25 DIAGNOSIS — R739 Hyperglycemia, unspecified: Secondary | ICD-10-CM

## 2017-09-25 NOTE — Patient Instructions (Signed)
Start avapro (irbesartan) daily.    Stop 1/2 hctz (hydrocholorothiazide)

## 2017-09-25 NOTE — Progress Notes (Signed)
Patient ID: Patricia Pacheco, female   DOB: 1941-04-21, 77 y.o.   MRN: [id number]   Subjective:    Patient ID: Patricia Pacheco, female    DOB: [birth year], 77 y.o.   MRN: [id number]  HPI  Patient here for a scheduled follow up.  Saw Dr Candelaria Stagers for her left shoulder.  Planning therapy.  She initially sated she did not tolerate her current medication.  Thought originally she was talking about the avapro.  On questioning her, she states it is the hctz she is not tolerating.  She never started the avapro, because she thought it was a cholesterol medication.  Discussed that she has had intolerance to multiple blood pressure medications.  She is wanting to try the avapro.  No chest pain.  No sob.  No acid reflux.  No abdominal pain.  Bowels moving.  States her blood pressures have mostly been averaging 638V systolic readings.  She has a lesion on her right shoulder and a rash on her legs.  Persistent.  Wants follow up with Dr Phillip Heal.  She will call and schedule appt.     Past Medical History:  Diagnosis Date  . Allergy    allergy shots (Juengel)  . Arthritis   . Asthma   . CAD (coronary artery disease)    cath 11/27/04 - moderated LAD disease  . Cancer ([place])    skin  . GERD (gastroesophageal reflux disease)    EGD (05/25/04) - slight presbyesophagus, mild esophagitis, mild gastritis, mild duodenitis - positive H. pylori.   Marland Kitchen Neuropathy    Past Surgical History:  Procedure Laterality Date  . APPENDECTOMY  1967   Family History  Problem Relation Age of Onset  . Arthritis Mother   . Colon polyps Sister   . Breast cancer Neg Hx    Social History   Socioeconomic History  . Marital status: Married    Spouse name: Not on file  . Number of children: Not on file  . Years of education: Not on file  . Highest education level: Not on file  Occupational History  . Not on file  Social Needs  . Financial resource strain: Not on file  . Food insecurity:    Worry: Not on file    Inability: Not on file    . Transportation needs:    Medical: Not on file    Non-medical: Not on file  Tobacco Use  . Smoking status: Never Smoker  . Smokeless tobacco: Never Used  Substance and Sexual Activity  . Alcohol use: No    Alcohol/week: 0.0 oz  . Drug use: No  . Sexual activity: Not on file  Lifestyle  . Physical activity:    Days per week: Not on file    Minutes per session: Not on file  . Stress: Not on file  Relationships  . Social connections:    Talks on phone: Not on file    Gets together: Not on file    Attends religious service: Not on file    Active member of club or organization: Not on file    Attends meetings of clubs or organizations: Not on file    Relationship status: Not on file  Other Topics Concern  . Not on file  Social History Narrative  . Not on file    Outpatient Encounter Medications as of 09/25/2017  Medication Sig  . ALPRAZolam (XANAX) 0.25 MG tablet TAKE ONE TABLET EVERY DAY AS NEEDED FOR ANXIETY  . antiseptic oral rinse (  BIOTENE) LIQD 15 mLs by Mouth Rinse route as needed for dry mouth.  Marland Kitchen aspirin 81 MG tablet Take 81 mg by mouth daily.  . Calcium Carbonate-Vit D-Min (CALCIUM 1200 PO) Take by mouth daily.  . COD LIVER OIL PO Take by mouth.  . Coenzyme Q10 (CO Q-10) 100 MG CAPS Take 1 capsule by mouth daily.  Marland Kitchen docusate sodium (COLACE) 100 MG capsule Take by mouth.  . fexofenadine (ALLEGRA) 180 MG tablet Take 1 tablet (180 mg total) by mouth daily as needed for allergies or rhinitis.  . fluocinolone (SYNALAR) 0.01 % external solution Apply topically 2 (two) times daily.  Marland Kitchen FLUoxetine (PROZAC) 10 MG capsule TAKE 1 CAPSULE BY MOUTH DAILY  . hydrochlorothiazide (HYDRODIURIL) 25 MG tablet TAKE 1/2 TABLET EVERY DAY  . irbesartan (AVAPRO) 75 MG tablet Take 1 tablet (75 mg total) by mouth daily.  . mometasone (ELOCON) 0.1 % lotion Apply 1 application topically as directed.  . montelukast (SINGULAIR) 10 MG tablet Take 10 mg by mouth at bedtime.  . Multiple Vitamin  (MULTIVITAMIN) tablet Take 1 tablet by mouth daily.  . mupirocin ointment (BACTROBAN) 2 % Place 1 application into the nose 2 (two) times daily.  . Omega-3 Fatty Acids (FISH OIL PO) Take by mouth daily.  Marland Kitchen omeprazole (PRILOSEC) 40 MG capsule TAKE 1 CAPSULE BY MOUTH EVERY DAY  . silver sulfADIAZINE (SILVADENE) 1 % cream Apply 1 application topically daily.  Marland Kitchen triamcinolone cream (KENALOG) 0.1 %   . UNABLE TO FIND   . [DISCONTINUED] triamcinolone (KENALOG) 0.025 % cream Apply 1 application topically 2 (two) times daily.   No facility-administered encounter medications on file as of 09/25/2017.     Review of Systems  Constitutional: Negative for appetite change and unexpected weight change.  HENT: Negative for congestion and sinus pressure.   Respiratory: Negative for cough, chest tightness and shortness of breath.   Cardiovascular: Negative for chest pain, palpitations and leg swelling.  Gastrointestinal: Negative for abdominal pain, diarrhea, nausea and vomiting.  Genitourinary: Negative for difficulty urinating and dysuria.  Musculoskeletal: Negative for joint swelling and myalgias.  Skin: Negative for color change and rash.  Neurological: Negative for dizziness, light-headedness and headaches.  Psychiatric/Behavioral: Negative for agitation and dysphoric mood.       Objective:    Physical Exam  Constitutional: She appears well-developed and well-nourished. No distress.  HENT:  Nose: Nose normal.  Mouth/Throat: Oropharynx is clear and moist.  Neck: Neck supple. No thyromegaly present.  Cardiovascular: Normal rate and regular rhythm.  Pulmonary/Chest: Breath sounds normal. No respiratory distress. She has no wheezes.  Abdominal: Soft. Bowel sounds are normal. There is no tenderness.  Musculoskeletal: She exhibits no edema or tenderness.  Lymphadenopathy:    She has no cervical adenopathy.  Skin: No rash noted. No erythema.  Psychiatric: She has a normal mood and affect. Her  behavior is normal.    BP (!) 144/80 (BP Location: Left Arm, Patient Position: Sitting, Cuff Size: Normal)   Pulse 81   Temp 98.2 F (36.8 C) (Oral)   Resp 18   Wt 134 lb 12.8 oz (61.1 kg)   LMP 08/29/1995   SpO2 98%   BMI 27.23 kg/m  Wt Readings from Last 3 Encounters:  09/25/17 134 lb 12.8 oz (61.1 kg)  07/01/17 132 lb 9.6 oz (60.1 kg)  04/11/17 133 lb 6.4 oz (60.5 kg)     Lab Results  Component Value Date   WBC 5.0 09/19/2016   HGB 12.0 09/19/2016  HCT 36.2 09/19/2016   PLT 195.0 09/19/2016   GLUCOSE 90 02/12/2017   CHOL 171 02/12/2017   TRIG 117.0 02/12/2017   HDL 60.40 02/12/2017   LDLDIRECT 115.0 09/04/2012   LDLCALC 88 02/12/2017   ALT 11 02/12/2017   AST 16 02/12/2017   NA 141 02/12/2017   K 4.0 02/12/2017   CL 104 02/12/2017   CREATININE 0.95 02/12/2017   BUN 18 02/12/2017   CO2 34 (H) 02/12/2017   TSH 1.39 05/21/2016    Mm Screening Breast Tomo Bilateral  Result Date: 05/28/2017 CLINICAL DATA:  Screening. EXAM: 2D DIGITAL SCREENING BILATERAL MAMMOGRAM WITH CAD AND ADJUNCT TOMO COMPARISON:  Previous exam(s). ACR Breast Density Category b: There are scattered areas of fibroglandular density. FINDINGS: There are no findings suspicious for malignancy. Images were processed with CAD. IMPRESSION: No mammographic evidence of malignancy. A result letter of this screening mammogram will be mailed directly to the patient. RECOMMENDATION: Screening mammogram in one year. (Code:SM-B-01Y) BI-RADS CATEGORY  1: Negative. Electronically Signed   By: Franki Cabot M.D.   On: 05/28/2017 13:34       Assessment & Plan:   Problem List Items Addressed This Visit    Anxiety    Stable on prozac.       Asthma    Breathing stable.  Previous evaluated by Dr Alva Garnet.      Essential hypertension    Has had problems with multiple medications.  Lisinopril caused a cough.  Cardiology stopped amlodipine.  States losartan caused diarrhea and swelling.  Reports aching with hctz.   Has not tried the avapro.  Is wanting to try.  States she has at home.  Will stop hctz.  Blood pressures as outlined.  Start avapro.  Follow.        Relevant Orders   CBC with Differential/Platelet   TSH   Basic metabolic panel   GERD (gastroesophageal reflux disease)    Controlled on current regimen.        Hypercholesterolemia    Desires not to start cholesterol medication.  Low cholesterol diet and exercise.  Follow lipid panel.        Relevant Orders   Hepatic function panel   Lipid panel   Shoulder pain, left    Seeing Dr Darci Current.  Physical therapy.        Skin lesions    Persistent rash and lesion on right shoulder.  She will schedule her appt with Dr Phillip Heal.         Other Visit Diagnoses    Hyperglycemia    -  Primary   Relevant Orders   Hemoglobin A1c       Einar Pheasant, MD

## 2017-09-29 ENCOUNTER — Encounter: Payer: Self-pay | Admitting: Internal Medicine

## 2017-09-29 NOTE — Assessment & Plan Note (Signed)
Breathing stable.  Previous evaluated by Dr Alva Garnet.

## 2017-09-29 NOTE — Assessment & Plan Note (Signed)
Seeing Dr Darci Current.  Physical therapy.

## 2017-09-29 NOTE — Assessment & Plan Note (Signed)
Desires not to start cholesterol medication.  Low cholesterol diet and exercise.  Follow lipid panel.

## 2017-09-29 NOTE — Assessment & Plan Note (Signed)
Stable on prozac.  

## 2017-09-29 NOTE — Assessment & Plan Note (Signed)
Has had problems with multiple medications.  Lisinopril caused a cough.  Cardiology stopped amlodipine.  States losartan caused diarrhea and swelling.  Reports aching with hctz.  Has not tried the avapro.  Is wanting to try.  States she has at home.  Will stop hctz.  Blood pressures as outlined.  Start avapro.  Follow.

## 2017-09-29 NOTE — Assessment & Plan Note (Signed)
Controlled on current regimen.   

## 2017-09-29 NOTE — Assessment & Plan Note (Signed)
Persistent rash and lesion on right shoulder.  She will schedule her appt with Dr Phillip Heal.

## 2017-10-04 ENCOUNTER — Other Ambulatory Visit (INDEPENDENT_AMBULATORY_CARE_PROVIDER_SITE_OTHER): Payer: PPO

## 2017-10-04 DIAGNOSIS — R739 Hyperglycemia, unspecified: Secondary | ICD-10-CM | POA: Diagnosis not present

## 2017-10-04 DIAGNOSIS — E7800 Pure hypercholesterolemia, unspecified: Secondary | ICD-10-CM

## 2017-10-04 DIAGNOSIS — I10 Essential (primary) hypertension: Secondary | ICD-10-CM | POA: Diagnosis not present

## 2017-10-04 LAB — CBC WITH DIFFERENTIAL/PLATELET
Basophils Absolute: 0 10*3/uL (ref 0.0–0.1)
Basophils Relative: 0.9 % (ref 0.0–3.0)
Eosinophils Absolute: 0.2 10*3/uL (ref 0.0–0.7)
Eosinophils Relative: 3.9 % (ref 0.0–5.0)
HEMATOCRIT: 35.7 % — AB (ref 36.0–46.0)
HEMOGLOBIN: 11.9 g/dL — AB (ref 12.0–15.0)
LYMPHS PCT: 38.8 % (ref 12.0–46.0)
Lymphs Abs: 2 10*3/uL (ref 0.7–4.0)
MCHC: 33.4 g/dL (ref 30.0–36.0)
MCV: 89.7 fl (ref 78.0–100.0)
MONOS PCT: 6.6 % (ref 3.0–12.0)
Monocytes Absolute: 0.3 10*3/uL (ref 0.1–1.0)
Neutro Abs: 2.5 10*3/uL (ref 1.4–7.7)
Neutrophils Relative %: 49.8 % (ref 43.0–77.0)
Platelets: 222 10*3/uL (ref 150.0–400.0)
RBC: 3.98 Mil/uL (ref 3.87–5.11)
RDW: 13.7 % (ref 11.5–15.5)
WBC: 5.1 10*3/uL (ref 4.0–10.5)

## 2017-10-04 LAB — HEPATIC FUNCTION PANEL
ALT: 17 U/L (ref 0–35)
AST: 22 U/L (ref 0–37)
Albumin: 4.1 g/dL (ref 3.5–5.2)
Alkaline Phosphatase: 77 U/L (ref 39–117)
BILIRUBIN DIRECT: 0.1 mg/dL (ref 0.0–0.3)
BILIRUBIN TOTAL: 0.4 mg/dL (ref 0.2–1.2)
TOTAL PROTEIN: 7 g/dL (ref 6.0–8.3)

## 2017-10-04 LAB — LIPID PANEL
Cholesterol: 189 mg/dL (ref 0–200)
HDL: 64.4 mg/dL (ref >39.00)
LDL CALC: 112 mg/dL — AB (ref 0–99)
NonHDL: 124.64
TRIGLYCERIDES: 65 mg/dL (ref 0.0–149.0)
Total CHOL/HDL Ratio: 3
VLDL: 13 mg/dL (ref 0.0–40.0)

## 2017-10-04 LAB — BASIC METABOLIC PANEL
BUN: 15 mg/dL (ref 6–23)
CALCIUM: 9.4 mg/dL (ref 8.4–10.5)
CO2: 31 mEq/L (ref 19–32)
Chloride: 102 mEq/L (ref 96–112)
Creatinine, Ser: 0.85 mg/dL (ref 0.40–1.20)
GFR: 68.73 mL/min (ref >60.00)
GLUCOSE: 89 mg/dL (ref 70–99)
POTASSIUM: 4 meq/L (ref 3.5–5.1)
SODIUM: 140 meq/L (ref 135–145)

## 2017-10-04 LAB — TSH: TSH: 2.28 u[IU]/mL (ref 0.35–4.50)

## 2017-10-04 LAB — HEMOGLOBIN A1C: Hgb A1c MFr Bld: 6.1 % (ref 4.6–6.5)

## 2017-10-21 ENCOUNTER — Other Ambulatory Visit: Payer: Self-pay | Admitting: Internal Medicine

## 2017-10-23 DIAGNOSIS — M9903 Segmental and somatic dysfunction of lumbar region: Secondary | ICD-10-CM | POA: Diagnosis not present

## 2017-10-23 DIAGNOSIS — M5134 Other intervertebral disc degeneration, thoracic region: Secondary | ICD-10-CM | POA: Diagnosis not present

## 2017-10-23 DIAGNOSIS — M9902 Segmental and somatic dysfunction of thoracic region: Secondary | ICD-10-CM | POA: Diagnosis not present

## 2017-10-23 DIAGNOSIS — M5416 Radiculopathy, lumbar region: Secondary | ICD-10-CM | POA: Diagnosis not present

## 2017-11-06 ENCOUNTER — Ambulatory Visit (INDEPENDENT_AMBULATORY_CARE_PROVIDER_SITE_OTHER): Payer: PPO | Admitting: Internal Medicine

## 2017-11-06 ENCOUNTER — Encounter: Payer: Self-pay | Admitting: Internal Medicine

## 2017-11-06 DIAGNOSIS — J4520 Mild intermittent asthma, uncomplicated: Secondary | ICD-10-CM | POA: Diagnosis not present

## 2017-11-06 DIAGNOSIS — E7800 Pure hypercholesterolemia, unspecified: Secondary | ICD-10-CM | POA: Diagnosis not present

## 2017-11-06 DIAGNOSIS — F419 Anxiety disorder, unspecified: Secondary | ICD-10-CM

## 2017-11-06 DIAGNOSIS — R079 Chest pain, unspecified: Secondary | ICD-10-CM

## 2017-11-06 DIAGNOSIS — I10 Essential (primary) hypertension: Secondary | ICD-10-CM | POA: Diagnosis not present

## 2017-11-06 DIAGNOSIS — K219 Gastro-esophageal reflux disease without esophagitis: Secondary | ICD-10-CM

## 2017-11-06 MED ORDER — HYDRALAZINE HCL 10 MG PO TABS: 10.0000 mg | ORAL_TABLET | Freq: Three times a day (TID) | ORAL | 1 refills | Status: DC

## 2017-11-06 NOTE — Progress Notes (Signed)
Patient ID: Patricia Pacheco, female   DOB: May 23, 1941, 77 y.o.   MRN: [id number]   Subjective:    Patient ID: Patricia Pacheco, female    DOB: Mar 22, 1941, 77 y.o.   MRN: [id number]  HPI  Patient here for a scheduled follow up.  She is here to follow up on her blood pressure.  She has had intolerance to multiple medications.  Was given avapro last visit.  Ran out this weekend. Since running out of medication, noticed knee pain was better.  Feels better off the medication.  She reports she tries to stay active.  Reports left anterior chest pain/pain left shoulder.  Notices this when blood pressure is increased.  No chest pain with increased activity or exertion.  No sob.  No acid reflux.  No abdominal pain.  Bowels moving.     Past Medical History:  Diagnosis Date  . Allergy    allergy shots (Juengel)  . Arthritis   . Asthma   . CAD (coronary artery disease)    cath 11/27/04 - moderated LAD disease  . Cancer ([place])    skin  . GERD (gastroesophageal reflux disease)    EGD (05/25/04) - slight presbyesophagus, mild esophagitis, mild gastritis, mild duodenitis - positive H. pylori.   Marland Kitchen Neuropathy    Past Surgical History:  Procedure Laterality Date  . APPENDECTOMY  1967   Family History  Problem Relation Age of Onset  . Arthritis Mother   . Colon polyps Sister   . Breast cancer Neg Hx    Social History   Socioeconomic History  . Marital status: Married    Spouse name: Not on file  . Number of children: Not on file  . Years of education: Not on file  . Highest education level: Not on file  Occupational History  . Not on file  Social Needs  . Financial resource strain: Not on file  . Food insecurity:    Worry: Not on file    Inability: Not on file  . Transportation needs:    Medical: Not on file    Non-medical: Not on file  Tobacco Use  . Smoking status: Never Smoker  . Smokeless tobacco: Never Used  Substance and Sexual Activity  . Alcohol use: No    Alcohol/week: 0.0 oz  . Drug  use: No  . Sexual activity: Not on file  Lifestyle  . Physical activity:    Days per week: Not on file    Minutes per session: Not on file  . Stress: Not on file  Relationships  . Social connections:    Talks on phone: Not on file    Gets together: Not on file    Attends religious service: Not on file    Active member of club or organization: Not on file    Attends meetings of clubs or organizations: Not on file    Relationship status: Not on file  Other Topics Concern  . Not on file  Social History Narrative  . Not on file    Outpatient Encounter Medications as of 11/06/2017  Medication Sig  . ALPRAZolam (XANAX) 0.25 MG tablet TAKE ONE TABLET EVERY DAY AS NEEDED FOR ANXIETY  . antiseptic oral rinse (BIOTENE) LIQD 15 mLs by Mouth Rinse route as needed for dry mouth.  Marland Kitchen aspirin 81 MG tablet Take 81 mg by mouth daily.  . Calcium Carbonate-Vit D-Min (CALCIUM 1200 PO) Take by mouth daily.  . COD LIVER OIL PO Take by mouth.  Marland Kitchen  Coenzyme Q10 (CO Q-10) 100 MG CAPS Take 1 capsule by mouth daily.  Marland Kitchen docusate sodium (COLACE) 100 MG capsule Take by mouth.  . fexofenadine (ALLEGRA) 180 MG tablet Take 1 tablet (180 mg total) by mouth daily as needed for allergies or rhinitis.  . fluocinolone (SYNALAR) 0.01 % external solution Apply topically 2 (two) times daily.  Marland Kitchen FLUoxetine (PROZAC) 10 MG capsule TAKE 1 CAPSULE BY MOUTH EVERY DAY  . hydrALAZINE (APRESOLINE) 10 MG tablet Take 1 tablet (10 mg total) by mouth 3 (three) times daily.  . hydrochlorothiazide (HYDRODIURIL) 25 MG tablet TAKE 1/2 TABLET EVERY DAY  . mometasone (ELOCON) 0.1 % lotion Apply 1 application topically as directed.  . montelukast (SINGULAIR) 10 MG tablet Take 10 mg by mouth at bedtime.  . Multiple Vitamin (MULTIVITAMIN) tablet Take 1 tablet by mouth daily.  . mupirocin ointment (BACTROBAN) 2 % Place 1 application into the nose 2 (two) times daily.  . Omega-3 Fatty Acids (FISH OIL PO) Take by mouth daily.  Marland Kitchen omeprazole  (PRILOSEC) 40 MG capsule TAKE 1 CAPSULE BY MOUTH EVERY DAY  . silver sulfADIAZINE (SILVADENE) 1 % cream Apply 1 application topically daily.  Marland Kitchen triamcinolone cream (KENALOG) 0.1 %   . UNABLE TO FIND   . [DISCONTINUED] irbesartan (AVAPRO) 75 MG tablet Take 1 tablet (75 mg total) by mouth daily.   No facility-administered encounter medications on file as of 11/06/2017.     Review of Systems  Constitutional: Negative for appetite change and unexpected weight change.  HENT: Negative for congestion and sinus pressure.   Respiratory: Negative for cough, chest tightness and shortness of breath.   Cardiovascular: Negative for palpitations and leg swelling.       Chest and shoulder pain as outlined.    Gastrointestinal: Negative for abdominal pain, diarrhea and nausea.  Genitourinary: Negative for difficulty urinating and dysuria.  Musculoskeletal: Negative for joint swelling and myalgias.  Skin: Negative for color change and rash.  Neurological: Negative for dizziness, light-headedness and headaches.  Psychiatric/Behavioral: Negative for agitation and dysphoric mood.       Objective:    Physical Exam  Constitutional: She appears well-developed and well-nourished. No distress.  HENT:  Nose: Nose normal.  Mouth/Throat: Oropharynx is clear and moist.  Neck: Neck supple. No thyromegaly present.  Cardiovascular: Normal rate and regular rhythm.  Pulmonary/Chest: Breath sounds normal. No respiratory distress. She has no wheezes.  Abdominal: Soft. Bowel sounds are normal. There is no tenderness.  Musculoskeletal: She exhibits no edema or tenderness.  Lymphadenopathy:    She has no cervical adenopathy.  Skin: No rash noted. No erythema.  Psychiatric: She has a normal mood and affect. Her behavior is normal.    BP (!) 150/84 (BP Location: Left Arm, Patient Position: Sitting, Cuff Size: Normal)   Pulse 80   Temp 98.3 F (36.8 C) (Oral)   Resp 18   Wt 134 lb 6.4 oz (61 kg)   LMP 08/29/1995    SpO2 98%   BMI 27.15 kg/m  Wt Readings from Last 3 Encounters:  11/06/17 134 lb 6.4 oz (61 kg)  09/25/17 134 lb 12.8 oz (61.1 kg)  07/01/17 132 lb 9.6 oz (60.1 kg)     Lab Results  Component Value Date   WBC 5.1 10/04/2017   HGB 11.9 (L) 10/04/2017   HCT 35.7 (L) 10/04/2017   PLT 222.0 10/04/2017   GLUCOSE 89 10/04/2017   CHOL 189 10/04/2017   TRIG 65.0 10/04/2017   HDL 64.40 10/04/2017  LDLDIRECT 115.0 09/04/2012   LDLCALC 112 (H) 10/04/2017   ALT 17 10/04/2017   AST 22 10/04/2017   NA 140 10/04/2017   K 4.0 10/04/2017   CL 102 10/04/2017   CREATININE 0.85 10/04/2017   BUN 15 10/04/2017   CO2 31 10/04/2017   TSH 2.28 10/04/2017   HGBA1C 6.1 10/04/2017    Mm Screening Breast Tomo Bilateral  Result Date: 05/28/2017 CLINICAL DATA:  Screening. EXAM: 2D DIGITAL SCREENING BILATERAL MAMMOGRAM WITH CAD AND ADJUNCT TOMO COMPARISON:  Previous exam(s). ACR Breast Density Category b: There are scattered areas of fibroglandular density. FINDINGS: There are no findings suspicious for malignancy. Images were processed with CAD. IMPRESSION: No mammographic evidence of malignancy. A result letter of this screening mammogram will be mailed directly to the patient. RECOMMENDATION: Screening mammogram in one year. (Code:SM-B-01Y) BI-RADS CATEGORY  1: Negative. Electronically Signed   By: Franki Cabot M.D.   On: 05/28/2017 13:34       Assessment & Plan:   Problem List Items Addressed This Visit    Anxiety    Stable on prozac.  Follow.        Asthma    Breathing stable.        Chest pain    Saw cardiology previously.  Had w/up as outlined previously.  With shoulder and chest pain as outlined.  Discussed further w/up including EKG, etc.  She declines.  Wants to monitor.        Essential hypertension    Has had problems with multiple medications.  Lisinopril causes a cough.  Cardiology stopped amlodipine.  Losartan she reports caused diarrhea and swelling.  Reports aching with  HCTZ.  Intolerance with avapro - reports aching.  Trial of hydralazine.  Follow pressures.  Follow metabolic panel.       Relevant Medications   hydrALAZINE (APRESOLINE) 10 MG tablet   GERD (gastroesophageal reflux disease)    Controlled on omeprazole.        Hypercholesterolemia    Desires not to start cholesterol medication.  Low cholesterol diet and exercise.  Follow lipid panel.        Relevant Medications   hydrALAZINE (APRESOLINE) 10 MG tablet       Einar Pheasant, MD

## 2017-11-17 ENCOUNTER — Encounter: Payer: Self-pay | Admitting: Internal Medicine

## 2017-11-17 NOTE — Assessment & Plan Note (Signed)
Has had problems with multiple medications.  Lisinopril causes a cough.  Cardiology stopped amlodipine.  Losartan she reports caused diarrhea and swelling.  Reports aching with HCTZ.  Intolerance with avapro - reports aching.  Trial of hydralazine.  Follow pressures.  Follow metabolic panel.

## 2017-11-17 NOTE — Assessment & Plan Note (Signed)
Desires not to start cholesterol medication.  Low cholesterol diet and exercise.  Follow lipid panel.

## 2017-11-17 NOTE — Assessment & Plan Note (Signed)
Saw cardiology previously.  Had w/up as outlined previously.  With shoulder and chest pain as outlined.  Discussed further w/up including EKG, etc.  She declines.  Wants to monitor.

## 2017-11-17 NOTE — Assessment & Plan Note (Signed)
Breathing stable.

## 2017-11-17 NOTE — Assessment & Plan Note (Signed)
Controlled on omeprazole.   

## 2017-11-17 NOTE — Assessment & Plan Note (Signed)
Stable on prozac.  Follow.

## 2017-11-20 ENCOUNTER — Other Ambulatory Visit: Payer: Self-pay | Admitting: Internal Medicine

## 2017-11-20 DIAGNOSIS — M9902 Segmental and somatic dysfunction of thoracic region: Secondary | ICD-10-CM | POA: Diagnosis not present

## 2017-11-20 DIAGNOSIS — M9903 Segmental and somatic dysfunction of lumbar region: Secondary | ICD-10-CM | POA: Diagnosis not present

## 2017-11-20 DIAGNOSIS — M5134 Other intervertebral disc degeneration, thoracic region: Secondary | ICD-10-CM | POA: Diagnosis not present

## 2017-11-20 DIAGNOSIS — M5416 Radiculopathy, lumbar region: Secondary | ICD-10-CM | POA: Diagnosis not present

## 2017-12-03 ENCOUNTER — Telehealth: Payer: Self-pay | Admitting: Internal Medicine

## 2017-12-03 ENCOUNTER — Ambulatory Visit: Payer: Self-pay

## 2017-12-03 NOTE — Telephone Encounter (Signed)
Copied from [place] [id number]. Topic: General - Other >> Dec 03, 2017  4:45 PM Patricia Pacheco, NT wrote: Reason for CRM:The patient called and would like to tell the Dr her blood pressure keeps gradually going up ,she would like to try another prescription ,her daughter wants to know if she ever been on benacar her blood pressure was 148/75 her feet were swollen and she stopped the medication and her feet went down in a few days .please call her  at [phone]

## 2017-12-03 NOTE — Telephone Encounter (Signed)
Patient called and asked about her request. She says "my feet were swelling, so I stopped taking the Hydralazine about 1 week ago and my feet went down. My BP was doing good, but now it's starting to creep up. Today it was 148/75. My daughter asked if I have ever been on Benicar, which I didn't know. She works at a cardiologist office and says Benicar is a good BP medication. I was wondering if I can be started on it or something else. I have an appointment on 12/16/17. If I go ahead and get started on something before my appointment, that will be enough time to see if it is what I need." I asked about symptoms, such as headache, dizziness, vision changes, she says "none of those. I do have dizziness, but it's not related to my BP, it's my allergies." I advised this will be sent to Dr. Derrel Nip for review and someone will call with her recommendation, patient verbalized understanding.

## 2017-12-03 NOTE — Telephone Encounter (Signed)
Duplicate encounter, already sent to provider.

## 2017-12-03 NOTE — Telephone Encounter (Signed)
Please advise 

## 2017-12-04 ENCOUNTER — Other Ambulatory Visit: Payer: Self-pay | Admitting: Internal Medicine

## 2017-12-04 MED ORDER — OLMESARTAN MEDOXOMIL 20 MG PO TABS: 20.0000 mg | ORAL_TABLET | Freq: Every day | ORAL | 1 refills | Status: DC

## 2017-12-04 NOTE — Progress Notes (Signed)
rx sent in to pharmacy for benicar 20mg  q day - #30 with one refill.

## 2017-12-04 NOTE — Telephone Encounter (Signed)
Left message for patient

## 2017-12-04 NOTE — Telephone Encounter (Signed)
I have sent in rx for benicar 20mg  - take one per day.  Monitor blood pressures.  Confirm no acute symptoms.  Will plan for f/u bp and lab at her next appt 12/16/17.

## 2017-12-04 NOTE — Telephone Encounter (Signed)
Patient called in right at 45 yesterday, requesting to start Benicar to be able to discuss with you at next visit. Has stopped hydralazine for about 1 week

## 2017-12-16 ENCOUNTER — Ambulatory Visit (INDEPENDENT_AMBULATORY_CARE_PROVIDER_SITE_OTHER): Payer: PPO | Admitting: Internal Medicine

## 2017-12-16 ENCOUNTER — Encounter: Payer: Self-pay | Admitting: Internal Medicine

## 2017-12-16 ENCOUNTER — Ambulatory Visit (INDEPENDENT_AMBULATORY_CARE_PROVIDER_SITE_OTHER): Payer: PPO

## 2017-12-16 VITALS — BP 150/80 | HR 69 | Temp 98.2°F | Resp 15 | Ht 60.75 in | Wt 134.8 lb

## 2017-12-16 VITALS — BP 158/74 | HR 80 | Temp 97.8°F | Resp 16 | Ht 59.0 in | Wt 134.0 lb

## 2017-12-16 DIAGNOSIS — H6121 Impacted cerumen, right ear: Secondary | ICD-10-CM | POA: Diagnosis not present

## 2017-12-16 DIAGNOSIS — H9191 Unspecified hearing loss, right ear: Secondary | ICD-10-CM

## 2017-12-16 DIAGNOSIS — Z0000 Encounter for general adult medical examination without abnormal findings: Secondary | ICD-10-CM

## 2017-12-16 DIAGNOSIS — K219 Gastro-esophageal reflux disease without esophagitis: Secondary | ICD-10-CM | POA: Diagnosis not present

## 2017-12-16 DIAGNOSIS — E7800 Pure hypercholesterolemia, unspecified: Secondary | ICD-10-CM | POA: Diagnosis not present

## 2017-12-16 DIAGNOSIS — I10 Essential (primary) hypertension: Secondary | ICD-10-CM | POA: Diagnosis not present

## 2017-12-16 LAB — BASIC METABOLIC PANEL
BUN: 19 mg/dL (ref 6–23)
CALCIUM: 9.4 mg/dL (ref 8.4–10.5)
CO2: 30 mEq/L (ref 19–32)
CREATININE: 0.83 mg/dL (ref 0.40–1.20)
Chloride: 103 mEq/L (ref 96–112)
GFR: 70.61 mL/min (ref >60.00)
GLUCOSE: 88 mg/dL (ref 70–99)
Potassium: 3.9 mEq/L (ref 3.5–5.1)
Sodium: 140 mEq/L (ref 135–145)

## 2017-12-16 LAB — LIPID PANEL
Cholesterol: 196 mg/dL (ref 0–200)
HDL: 61.1 mg/dL (ref >39.00)
LDL Cholesterol: 116 mg/dL — ABNORMAL HIGH (ref 0–99)
NONHDL: 134.96
Total CHOL/HDL Ratio: 3
Triglycerides: 95 mg/dL (ref 0.0–149.0)
VLDL: 19 mg/dL (ref 0.0–40.0)

## 2017-12-16 LAB — HEPATIC FUNCTION PANEL
ALBUMIN: 4.2 g/dL (ref 3.5–5.2)
ALK PHOS: 79 U/L (ref 39–117)
ALT: 11 U/L (ref 0–35)
AST: 16 U/L (ref 0–37)
BILIRUBIN TOTAL: 0.4 mg/dL (ref 0.2–1.2)
Bilirubin, Direct: 0 mg/dL (ref 0.0–0.3)
Total Protein: 7 g/dL (ref 6.0–8.3)

## 2017-12-16 LAB — TSH: TSH: 1.63 u[IU]/mL (ref 0.35–4.50)

## 2017-12-16 NOTE — Progress Notes (Addendum)
Subjective:   Patricia Pacheco is a 77 y.o. female who presents for an Initial Medicare Annual Wellness Visit.  Review of Systems    No ROS.  Medicare Wellness Visit. Additional risk factors are reflected in the social history.  Cardiac Risk Factors include: advanced age (>72men, >60 women);hypertension     Objective:    Today's Vitals   12/16/17 1709  BP: (!) 150/80  Pulse: 69  Resp: 15  Temp: 98.2 F (36.8 C)  TempSrc: Oral  SpO2: 98%  Weight: 134 lb 12.8 oz (61.1 kg)  Height: 5' 0.75" (1.543 m)   Body mass index is 25.68 kg/m.  Advanced Directives 12/16/2017  Does Patient Have a Medical Advance Directive? Yes  Type of Paramedic of [place];Living will  Does patient want to make changes to medical advance directive? No - Patient declined  Copy of [place] in Chart? No - copy requested    Current Medications (verified) Outpatient Encounter Medications as of 12/16/2017  Medication Sig  . ALPRAZolam (XANAX) 0.25 MG tablet TAKE ONE TABLET EVERY DAY AS NEEDED FOR ANXIETY  . antiseptic oral rinse (BIOTENE) LIQD 15 mLs by Mouth Rinse route as needed for dry mouth.  Marland Kitchen aspirin 81 MG tablet Take 81 mg by mouth daily.  . Calcium Carbonate-Vit D-Min (CALCIUM 1200 PO) Take by mouth daily.  . COD LIVER OIL PO Take by mouth.  . Coenzyme Q10 (CO Q-10) 100 MG CAPS Take 1 capsule by mouth daily.  Marland Kitchen docusate sodium (COLACE) 100 MG capsule Take by mouth.  . fexofenadine (ALLEGRA) 180 MG tablet Take 1 tablet (180 mg total) by mouth daily as needed for allergies or rhinitis.  . fluocinolone (SYNALAR) 0.01 % external solution Apply topically 2 (two) times daily.  Marland Kitchen FLUoxetine (PROZAC) 10 MG capsule TAKE 1 CAPSULE BY MOUTH EVERY DAY  . hydrochlorothiazide (HYDRODIURIL) 25 MG tablet TAKE 1/2 TABLET EVERY DAY  . mometasone (ELOCON) 0.1 % lotion Apply 1 application topically as directed.  . montelukast (SINGULAIR) 10 MG tablet Take 10 mg by mouth at  bedtime.  . Multiple Vitamin (MULTIVITAMIN) tablet Take 1 tablet by mouth daily.  . mupirocin ointment (BACTROBAN) 2 % Place 1 application into the nose 2 (two) times daily.  Marland Kitchen olmesartan (BENICAR) 20 MG tablet Take 1 tablet (20 mg total) by mouth daily.  . Omega-3 Fatty Acids (FISH OIL PO) Take by mouth daily.  Marland Kitchen omeprazole (PRILOSEC) 40 MG capsule TAKE 1 CAPSULE BY MOUTH EVERY DAY  . silver sulfADIAZINE (SILVADENE) 1 % cream Apply 1 application topically daily.  Marland Kitchen triamcinolone cream (KENALOG) 0.1 %   . UNABLE TO FIND    No facility-administered encounter medications on file as of 12/16/2017.     Allergies (verified) Patient has no known allergies.   History: Past Medical History:  Diagnosis Date  . Allergy    allergy shots (Juengel)  . Arthritis   . Asthma   . CAD (coronary artery disease)    cath 11/27/04 - moderated LAD disease  . Cancer ([place])    skin  . GERD (gastroesophageal reflux disease)    EGD (05/25/04) - slight presbyesophagus, mild esophagitis, mild gastritis, mild duodenitis - positive H. pylori.   Marland Kitchen Neuropathy    Past Surgical History:  Procedure Laterality Date  . APPENDECTOMY  1967   Family History  Problem Relation Age of Onset  . Arthritis Mother   . Colon polyps Sister   . Breast cancer Neg Hx  Social History   Socioeconomic History  . Marital status: Married    Spouse name: Not on file  . Number of children: Not on file  . Years of education: Not on file  . Highest education level: Not on file  Occupational History  . Not on file  Social Needs  . Financial resource strain: Not on file  . Food insecurity:    Worry: Never true    Inability: Never true  . Transportation needs:    Medical: No    Non-medical: No  Tobacco Use  . Smoking status: Never Smoker  . Smokeless tobacco: Never Used  Substance and Sexual Activity  . Alcohol use: No    Alcohol/week: 0.0 oz  . Drug use: No  . Sexual activity: Not on file  Lifestyle  . Physical  activity:    Days per week: Not on file    Minutes per session: Not on file  . Stress: Not at all  Relationships  . Social connections:    Talks on phone: Not on file    Gets together: Not on file    Attends religious service: Not on file    Active member of club or organization: Not on file    Attends meetings of clubs or organizations: Not on file    Relationship status: Not on file  Other Topics Concern  . Not on file  Social History Narrative  . Not on file    Tobacco Counseling Counseling given: Not Answered   Clinical Intake:  Pre-visit preparation completed: Yes  Pain : No/denies pain     Nutritional Status: BMI 25 -29 Overweight Diabetes: No  How often do you need to have someone help you when you read instructions, pamphlets, or other written materials from your doctor or pharmacy?: 1 - Never  Interpreter Needed?: No      Activities of Daily Living In your present state of health, do you have any difficulty performing the following activities: 12/16/2017  Hearing? N  Vision? N  Difficulty concentrating or making decisions? N  Walking or climbing stairs? N  Dressing or bathing? N  Doing errands, shopping? N  Preparing Food and eating ? N  Using the Toilet? N  In the past six months, have you accidently leaked urine? N  Do you have problems with loss of bowel control? N  Managing your Medications? N  Managing your Finances? N  Housekeeping or managing your Housekeeping? N  Some recent data might be hidden     Immunizations and Health Maintenance Immunization History  Administered Date(s) Administered  . Influenza, High Dose Seasonal PF 03/28/2016, 04/12/2017  . Influenza,inj,Quad PF,6+ Mos 06/23/2013, 05/03/2014, 05/11/2015  . Influenza-Unspecified 04/12/2017  . Pneumococcal Conjugate-13 05/11/2015  . Pneumococcal Polysaccharide-23 07/01/2017  . Zoster 05/31/2008   Health Maintenance Due  Topic Date Due  . Samul Dada  08/28/1959     Patient Care Team: Einar Pheasant, MD as PCP - General (Internal Medicine) Einar Pheasant, MD (Internal Medicine) Blima Rich, MD as Referring Physician (Obstetrics and Gynecology)  Indicate any recent Medical Services you may have received from other than [facility] providers in the past year (date may be approximate).     Assessment:   This is a routine wellness examination for Kashonda.  The goal of the wellness visit is to assist the patient how to close the gaps in care and create a preventative care plan for the patient.   The roster of all physicians providing medical care to patient is  listed in the Snapshot section of the chart.  Taking calcium VIT D as appropriate/Osteoporosis risk reviewed.    Safety issues reviewed; Smoke and carbon monoxide detectors in the home. No firearms or firearms locked in a safe within the home. Wears seatbelts when driving or riding with others. No violence in the home.  They do not have excessive sun exposure.  Discussed the need for sun protection: hats, long sleeves and the use of sunscreen if there is significant sun exposure.  Patient is alert, normal appearance, oriented to person/place/and time.  Correctly identified the president of the [place] and recalls of 3/3 words. Performs simple calculations and can read correct time from watch face.  Displays appropriate judgement.  No new identified risk were noted.  No failures at ADL's or IADL's.    BMI- discussed the importance of a healthy diet, water intake and the benefits of aerobic exercise. Educational material provided.   24 hour diet recall: Regular diet  Daily fluid intake: 3-5 cups of caffeine,  1 cups of water.  Dental- every 12 months.  Eye- Visual acuity not assessed per patient preference since they have regular follow up with the ophthalmologist.  Wears corrective lenses.  Sleep patterns- Sleeps without issues.   Labs complete.  TDAP vaccine deferred per patient  preference.  Follow up with insurance.  Educational material provided.  HTN- followed by pcp.  Taking medications as prescribed. Deferred to pcp for follow up.   Hearing/Vision screen Hearing Screening Comments: Patient has difficulty hearing R ear.  No hearing aids worn.  Followed by Dr. Pryor Ochoa.  Vision Screening Comments: Followed by [facility] Wears corrective lenses Annual visits Lasik complete Cataract extraction, bilateral Visual acuity not assessed per patient preference since they have regular follow up with the ophthalmologist  Dietary issues and exercise activities discussed: Current Exercise Habits: Home exercise routine, Type of exercise: walking, Time (Minutes): 15, Frequency (Times/Week): 3, Weekly Exercise (Minutes/Week): 45, Intensity: Mild  Goals    . DIET - INCREASE LEAN PROTEINS     Low cholesterol diet    . DIET - INCREASE WATER INTAKE     Monitor caffeine intake For every 1 cup of coffee drink 2 cups of water Stay hydrated      Depression Screen PHQ 2/9 Scores 12/16/2017 07/01/2017 04/11/2017 03/28/2016 11/01/2014 10/29/2013 08/30/2012  PHQ - 2 Score 0 0 0 0 0 0 0  PHQ- 9 Score - - 0 - - - -    Fall Risk Fall Risk  12/16/2017 07/01/2017 04/11/2017 03/28/2016 11/01/2014  Falls in the past year? No No No No Yes  Number falls in past yr: - - - - 1  Injury with Fall? - - - - Yes   Cognitive Function:     6CIT Screen 12/16/2017  What Year? 0 points  What month? 0 points  What time? 0 points  Count back from 20 0 points  Months in reverse 0 points  Repeat phrase 0 points  Total Score 0    Screening Tests Health Maintenance  Topic Date Due  . TETANUS/TDAP  08/28/1958  . INFLUENZA VACCINE  01/23/2018  . MAMMOGRAM  05/28/2018  . DEXA SCAN  Completed  . PNA vac Low Risk Adult  Completed      Plan:    End of life planning; Advance aging; Advanced directives discussed. Copy of current HCPOA/Living Will requested.    I have personally reviewed and  noted the following in the patient's chart:   .  Medical and social history . Use of alcohol, tobacco or illicit drugs  . Current medications and supplements . Functional ability and status . Nutritional status . Physical activity . Advanced directives . List of other physicians . Hospitalizations, surgeries, and ER visits in previous 12 months . Vitals . Screenings to include cognitive, depression, and falls . Referrals and appointments  In addition, I have reviewed and discussed with patient certain preventive protocols, quality metrics, and best practice recommendations. A written personalized care plan for preventive services as well as general preventive health recommendations were provided to patient.     OBrien-Blaney, Jameshia Hayashida L, LPN   [id number]     Pt seen and evaluated.  Just started benicar.  Outside blood pressures reviewed.  Trending down.  Continue same medication.  Follow pressures.  See note.    Dr Nicki Reaper

## 2017-12-16 NOTE — Progress Notes (Signed)
Patient ID: Patricia Pacheco, female   DOB: Oct 04, 1940, 77 y.o.   MRN: [id number]   Subjective:    Patient ID: Patricia Pacheco, female    DOB: 03-21-1941, 77 y.o.   MRN: [id number]  HPI  Patient here for a scheduled follow up.  Here to f/u regarding her blood pressure.  She has had intolerance to multiple medications.  Recently started benicar.  Tolerating so far.  Has been on this medication for one week.  States pressures have been improving, with last check at home in the 120s.  No headache.  No dizziness.  No chest pain or sob.  No abdominal pain.  Bowels moving.  Does have cerumen impaction - right ear. Offered ear irrigation.  She prefers referral to Dr Pryor Ochoa.  No ear pain.     Past Medical History:  Diagnosis Date  . Allergy    allergy shots (Juengel)  . Arthritis   . Asthma   . CAD (coronary artery disease)    cath 11/27/04 - moderated LAD disease  . Cancer ([place])    skin  . GERD (gastroesophageal reflux disease)    EGD (05/25/04) - slight presbyesophagus, mild esophagitis, mild gastritis, mild duodenitis - positive H. pylori.   Marland Kitchen Neuropathy    Past Surgical History:  Procedure Laterality Date  . APPENDECTOMY  1967   Family History  Problem Relation Age of Onset  . Arthritis Mother   . Colon polyps Sister   . Breast cancer Neg Hx    Social History   Socioeconomic History  . Marital status: Married    Spouse name: Not on file  . Number of children: Not on file  . Years of education: Not on file  . Highest education level: Not on file  Occupational History  . Not on file  Social Needs  . Financial resource strain: Not on file  . Food insecurity:    Worry: Never true    Inability: Never true  . Transportation needs:    Medical: No    Non-medical: No  Tobacco Use  . Smoking status: Never Smoker  . Smokeless tobacco: Never Used  Substance and Sexual Activity  . Alcohol use: No    Alcohol/week: 0.0 oz  . Drug use: No  . Sexual activity: Not on file  Lifestyle  .  Physical activity:    Days per week: Not on file    Minutes per session: Not on file  . Stress: Not at all  Relationships  . Social connections:    Talks on phone: Not on file    Gets together: Not on file    Attends religious service: Not on file    Active member of club or organization: Not on file    Attends meetings of clubs or organizations: Not on file    Relationship status: Not on file  Other Topics Concern  . Not on file  Social History Narrative  . Not on file    Outpatient Encounter Medications as of 12/16/2017  Medication Sig  . ALPRAZolam (XANAX) 0.25 MG tablet TAKE ONE TABLET EVERY DAY AS NEEDED FOR ANXIETY  . antiseptic oral rinse (BIOTENE) LIQD 15 mLs by Mouth Rinse route as needed for dry mouth.  Marland Kitchen aspirin 81 MG tablet Take 81 mg by mouth daily.  . Calcium Carbonate-Vit D-Min (CALCIUM 1200 PO) Take by mouth daily.  . COD LIVER OIL PO Take by mouth.  . Coenzyme Q10 (CO Q-10) 100 MG CAPS Take 1 capsule by  mouth daily.  Marland Kitchen docusate sodium (COLACE) 100 MG capsule Take by mouth.  . fexofenadine (ALLEGRA) 180 MG tablet Take 1 tablet (180 mg total) by mouth daily as needed for allergies or rhinitis.  . fluocinolone (SYNALAR) 0.01 % external solution Apply topically 2 (two) times daily.  Marland Kitchen FLUoxetine (PROZAC) 10 MG capsule TAKE 1 CAPSULE BY MOUTH EVERY DAY  . hydrochlorothiazide (HYDRODIURIL) 25 MG tablet TAKE 1/2 TABLET EVERY DAY  . mometasone (ELOCON) 0.1 % lotion Apply 1 application topically as directed.  . montelukast (SINGULAIR) 10 MG tablet Take 10 mg by mouth at bedtime.  . Multiple Vitamin (MULTIVITAMIN) tablet Take 1 tablet by mouth daily.  . mupirocin ointment (BACTROBAN) 2 % Place 1 application into the nose 2 (two) times daily.  Marland Kitchen olmesartan (BENICAR) 20 MG tablet Take 1 tablet (20 mg total) by mouth daily.  . Omega-3 Fatty Acids (FISH OIL PO) Take by mouth daily.  Marland Kitchen omeprazole (PRILOSEC) 40 MG capsule TAKE 1 CAPSULE BY MOUTH EVERY DAY  . silver sulfADIAZINE  (SILVADENE) 1 % cream Apply 1 application topically daily.  Marland Kitchen triamcinolone cream (KENALOG) 0.1 %   . UNABLE TO FIND    No facility-administered encounter medications on file as of 12/16/2017.     Review of Systems  Constitutional: Negative for appetite change and unexpected weight change.  HENT: Negative for congestion and sinus pressure.   Respiratory: Negative for cough, chest tightness and shortness of breath.   Cardiovascular: Negative for chest pain and palpitations.       No significant pedal and lower extremity swelling.   Gastrointestinal: Negative for abdominal pain, diarrhea and nausea.  Genitourinary: Negative for difficulty urinating and dysuria.  Musculoskeletal: Negative for joint swelling and myalgias.  Neurological: Negative for dizziness, light-headedness and headaches.  Psychiatric/Behavioral: Negative for agitation and dysphoric mood.       Objective:     Blood pressure rechecked by me:  148-150/78  Physical Exam  Constitutional: She appears well-developed and well-nourished. No distress.  HENT:  Nose: Nose normal.  Mouth/Throat: Oropharynx is clear and moist.  Neck: Neck supple. No thyromegaly present.  Cardiovascular: Normal rate and regular rhythm.  Pulmonary/Chest: Breath sounds normal. No respiratory distress. She has no wheezes.  Abdominal: Soft. Bowel sounds are normal. There is no tenderness.  Musculoskeletal: She exhibits no edema or tenderness.  Lymphadenopathy:    She has no cervical adenopathy.  Skin: No rash noted. No erythema.  Psychiatric: She has a normal mood and affect. Her behavior is normal.    BP (!) 158/74 (BP Location: Left Arm, Patient Position: Sitting, Cuff Size: Normal)   Pulse 80   Temp 97.8 F (36.6 C) (Oral)   Resp 16   Ht 4\' 11"  (1.499 m)   Wt 134 lb (60.8 kg)   LMP 08/29/1995   SpO2 96%   BMI 27.06 kg/m  Wt Readings from Last 3 Encounters:  12/16/17 134 lb (60.8 kg)  12/16/17 134 lb 12.8 oz (61.1 kg)  11/06/17  134 lb 6.4 oz (61 kg)     Lab Results  Component Value Date   WBC 5.1 10/04/2017   HGB 11.9 (L) 10/04/2017   HCT 35.7 (L) 10/04/2017   PLT 222.0 10/04/2017   GLUCOSE 88 12/16/2017   CHOL 196 12/16/2017   TRIG 95.0 12/16/2017   HDL 61.10 12/16/2017   LDLDIRECT 115.0 09/04/2012   LDLCALC 116 (H) 12/16/2017   ALT 11 12/16/2017   AST 16 12/16/2017   NA 140 12/16/2017  K 3.9 12/16/2017   CL 103 12/16/2017   CREATININE 0.83 12/16/2017   BUN 19 12/16/2017   CO2 30 12/16/2017   TSH 1.63 12/16/2017   HGBA1C 6.1 10/04/2017    Mm Screening Breast Tomo Bilateral  Result Date: 05/28/2017 CLINICAL DATA:  Screening. EXAM: 2D DIGITAL SCREENING BILATERAL MAMMOGRAM WITH CAD AND ADJUNCT TOMO COMPARISON:  Previous exam(s). ACR Breast Density Category b: There are scattered areas of fibroglandular density. FINDINGS: There are no findings suspicious for malignancy. Images were processed with CAD. IMPRESSION: No mammographic evidence of malignancy. A result letter of this screening mammogram will be mailed directly to the patient. RECOMMENDATION: Screening mammogram in one year. (Code:SM-B-01Y) BI-RADS CATEGORY  1: Negative. Electronically Signed   By: Franki Cabot M.D.   On: 05/28/2017 13:34       Assessment & Plan:   Problem List Items Addressed This Visit    Essential hypertension    Has had problems with multiple medications.  Lisinopril causes a cough.  Cardiology stopped amlodipine.  Losartan she reports caused diarrhea and swelling.  Reports aching with hctz.  Intolerance to avapro - aching.  Intolerance to hydralazine.  On benicar now.  Tolerating.  Blood pressure improving.  Continue current regimen for now.  Follow metabolic panel.        GERD (gastroesophageal reflux disease)    On omeprazole.        Hearing loss    Was previously evaluated and told needed hearing aid.  Declines.  With cerumen impaction now.  Offered ear irrigation.  She declined.  Request referral to Dr Pryor Ochoa.          Relevant Orders   Ambulatory referral to ENT   Hypercholesterolemia    Desires not to take cholesterol medication.  Low cholesterol diet and exercise.  Follow lipid panel.         Other Visit Diagnoses    Impacted cerumen of right ear    -  Primary   offered ear irrigation.  she declined.  request referral to ENT.     Relevant Orders   Ambulatory referral to ENT       Einar Pheasant, MD

## 2017-12-16 NOTE — Patient Instructions (Addendum)
  Patricia Pacheco , Thank you for taking time to come for your Medicare Wellness Visit. I appreciate your ongoing commitment to your health goals. Please review the following plan we discussed and let me know if I can assist you in the future.   These are the goals we discussed: Goals    . DIET - INCREASE LEAN PROTEINS     Low cholesterol diet    . DIET - INCREASE WATER INTAKE     Monitor caffeine intake For every 1 cup of coffee drink 2 cups of water Stay hydrated       This is a list of the screening recommended for you and due dates:  Health Maintenance  Topic Date Due  . Tetanus Vaccine  08/28/1958  . Flu Shot  01/23/2018  . Mammogram  05/28/2018  . DEXA scan (bone density measurement)  Completed  . Pneumonia vaccines  Completed

## 2017-12-17 ENCOUNTER — Encounter: Payer: Self-pay | Admitting: Internal Medicine

## 2017-12-17 ENCOUNTER — Ambulatory Visit: Payer: PPO

## 2017-12-17 NOTE — Assessment & Plan Note (Signed)
Has had problems with multiple medications.  Lisinopril causes a cough.  Cardiology stopped amlodipine.  Losartan she reports caused diarrhea and swelling.  Reports aching with hctz.  Intolerance to avapro - aching.  Intolerance to hydralazine.  On benicar now.  Tolerating.  Blood pressure improving.  Continue current regimen for now.  Follow metabolic panel.

## 2017-12-17 NOTE — Assessment & Plan Note (Signed)
On omeprazole.  

## 2017-12-17 NOTE — Assessment & Plan Note (Signed)
Was previously evaluated and told needed hearing aid.  Declines.  With cerumen impaction now.  Offered ear irrigation.  She declined.  Request referral to Dr Pryor Ochoa.

## 2017-12-17 NOTE — Assessment & Plan Note (Signed)
Desires not to take cholesterol medication.  Low cholesterol diet and exercise.  Follow lipid panel.   

## 2017-12-18 DIAGNOSIS — M9902 Segmental and somatic dysfunction of thoracic region: Secondary | ICD-10-CM | POA: Diagnosis not present

## 2017-12-18 DIAGNOSIS — M5134 Other intervertebral disc degeneration, thoracic region: Secondary | ICD-10-CM | POA: Diagnosis not present

## 2017-12-18 DIAGNOSIS — M5416 Radiculopathy, lumbar region: Secondary | ICD-10-CM | POA: Diagnosis not present

## 2017-12-18 DIAGNOSIS — M9903 Segmental and somatic dysfunction of lumbar region: Secondary | ICD-10-CM | POA: Diagnosis not present

## 2017-12-30 DIAGNOSIS — R898 Other abnormal findings in specimens from other organs, systems and tissues: Secondary | ICD-10-CM | POA: Diagnosis not present

## 2018-01-03 ENCOUNTER — Telehealth: Payer: Self-pay | Admitting: *Deleted

## 2018-01-03 DIAGNOSIS — I10 Essential (primary) hypertension: Secondary | ICD-10-CM

## 2018-01-03 NOTE — Telephone Encounter (Signed)
Would she be willing to try norvasc 5mg  q day.  She has tried multiple medications and has had problems.  Can start norvasc 5mg  and follow pressures.

## 2018-01-03 NOTE — Telephone Encounter (Signed)
Result note read to patient; verbalizes understanding. During call pt requesting NT alert Dr. Nicki Reaper of "Problem with last BP medication." Pt states she took Benicar for 20 days then developed pain in bilateral knees to feet. States she stopped taking "For 3 days and felt better." Last took [facility] Saturday 12/28/17. Asked pt to check BP during call ...161/80  [id number]. States 2 days ago 151/71. Pt questioning if alternate medication can be sent or appropriate to try 1/2 dose.  Please advise: [phone]

## 2018-01-06 MED ORDER — AMLODIPINE BESYLATE 5 MG PO TABS: 5.0000 mg | ORAL_TABLET | Freq: Every day | ORAL | 3 refills | Status: DC

## 2018-01-06 NOTE — Addendum Note (Signed)
Addended by: Nanci Pina on: 01/06/2018 04:44 PM   Modules accepted: Orders

## 2018-01-06 NOTE — Telephone Encounter (Signed)
Medication sent to pharmacy patient agreed to recommendation.

## 2018-01-08 ENCOUNTER — Ambulatory Visit: Payer: PPO | Admitting: Podiatry

## 2018-01-08 ENCOUNTER — Ambulatory Visit (INDEPENDENT_AMBULATORY_CARE_PROVIDER_SITE_OTHER): Payer: PPO

## 2018-01-08 ENCOUNTER — Encounter: Payer: Self-pay | Admitting: Podiatry

## 2018-01-08 VITALS — BP 138/77 | HR 74 | Resp 16

## 2018-01-08 DIAGNOSIS — M722 Plantar fascial fibromatosis: Secondary | ICD-10-CM

## 2018-01-08 MED ORDER — TRAMADOL HCL 50 MG PO TABS: 50.0000 mg | ORAL_TABLET | Freq: Three times a day (TID) | ORAL | 0 refills | Status: DC | PRN

## 2018-01-08 MED ORDER — METHYLPREDNISOLONE 4 MG PO TBPK: ORAL_TABLET | ORAL | 0 refills | Status: DC

## 2018-01-08 MED ORDER — CELECOXIB 200 MG PO CAPS: 200.0000 mg | ORAL_CAPSULE | Freq: Every day | ORAL | 2 refills | Status: DC

## 2018-01-08 NOTE — Patient Instructions (Signed)

## 2018-01-08 NOTE — Progress Notes (Signed)
Subjective:  Patient ID: Patricia Pacheco, female    DOB: 1940-11-25,  MRN: [id number] HPI Chief Complaint  Patient presents with  . Foot Pain    Plantar heel bilateral - aching x years off and on, has managed it with tylenol and sees a chiropractor until she started on her new BP pill which made her feet swell and hurt  . New Patient (Initial Visit)    77 y.o. female presents with the above complaint.   ROS: Denies fever chills nausea vomiting muscle aches pains calf pain back pain chest pain shortness of breath.  Past Medical History:  Diagnosis Date  . Allergy    allergy shots (Juengel)  . Arthritis   . Asthma   . CAD (coronary artery disease)    cath 11/27/04 - moderated LAD disease  . Cancer ([place])    skin  . GERD (gastroesophageal reflux disease)    EGD (05/25/04) - slight presbyesophagus, mild esophagitis, mild gastritis, mild duodenitis - positive H. pylori.   Marland Kitchen Neuropathy    Past Surgical History:  Procedure Laterality Date  . APPENDECTOMY  1967    Current Outpatient Medications:  .  ALPRAZolam (XANAX) 0.25 MG tablet, TAKE ONE TABLET EVERY DAY AS NEEDED FOR ANXIETY, Disp: 30 tablet, Rfl: 0 .  amLODipine (NORVASC) 5 MG tablet, Take 1 tablet (5 mg total) by mouth daily., Disp: 90 tablet, Rfl: 3 .  antiseptic oral rinse (BIOTENE) LIQD, 15 mLs by Mouth Rinse route as needed for dry mouth., Disp: , Rfl:  .  aspirin 81 MG tablet, Take 81 mg by mouth daily., Disp: , Rfl:  .  Calcium Carbonate-Vit D-Min (CALCIUM 1200 PO), Take by mouth daily., Disp: , Rfl:  .  celecoxib (CELEBREX) 200 MG capsule, Take 1 capsule (200 mg total) by mouth daily., Disp: 30 capsule, Rfl: 2 .  COD LIVER OIL PO, Take by mouth., Disp: , Rfl:  .  Coenzyme Q10 (CO Q-10) 100 MG CAPS, Take 1 capsule by mouth daily., Disp: , Rfl:  .  docusate sodium (COLACE) 100 MG capsule, Take by mouth., Disp: , Rfl:  .  fexofenadine (ALLEGRA) 180 MG tablet, Take 1 tablet (180 mg total) by mouth daily as needed for allergies  or rhinitis., Disp: 30 tablet, Rfl: 0 .  fluocinolone (SYNALAR) 0.01 % external solution, Apply topically 2 (two) times daily., Disp: , Rfl:  .  FLUoxetine (PROZAC) 10 MG capsule, TAKE 1 CAPSULE BY MOUTH EVERY DAY, Disp: 30 capsule, Rfl: 2 .  hydrochlorothiazide (HYDRODIURIL) 25 MG tablet, TAKE 1/2 TABLET EVERY DAY, Disp: 30 tablet, Rfl: 1 .  methylPREDNISolone (MEDROL DOSEPAK) 4 MG TBPK tablet, 6 day dose pack - take as directed, Disp: 21 tablet, Rfl: 0 .  mometasone (ELOCON) 0.1 % lotion, Apply 1 application topically as directed., Disp: , Rfl:  .  montelukast (SINGULAIR) 10 MG tablet, Take 10 mg by mouth at bedtime., Disp: , Rfl:  .  Multiple Vitamin (MULTIVITAMIN) tablet, Take 1 tablet by mouth daily., Disp: , Rfl:  .  mupirocin ointment (BACTROBAN) 2 %, Place 1 application into the nose 2 (two) times daily., Disp: 22 g, Rfl: 0 .  olmesartan (BENICAR) 20 MG tablet, Take 1 tablet (20 mg total) by mouth daily., Disp: 30 tablet, Rfl: 1 .  Omega-3 Fatty Acids (FISH OIL PO), Take by mouth daily., Disp: , Rfl:  .  omeprazole (PRILOSEC) 40 MG capsule, TAKE 1 CAPSULE BY MOUTH EVERY DAY, Disp: 30 capsule, Rfl: 5 .  silver sulfADIAZINE (SILVADENE)  1 % cream, Apply 1 application topically daily., Disp: 50 g, Rfl: 0 .  traMADol (ULTRAM) 50 MG tablet, Take 1 tablet (50 mg total) by mouth every 8 (eight) hours as needed., Disp: 30 tablet, Rfl: 0 .  triamcinolone cream (KENALOG) 0.1 %, , Disp: , Rfl:  .  UNABLE TO FIND, , Disp: , Rfl:   No Known Allergies Review of Systems Objective:   Vitals:   01/08/18 1002  BP: 138/77  Pulse: 74  Resp: 16    General: Well developed, nourished, in no acute distress, alert and oriented x3   Dermatological: Skin is warm, dry and supple bilateral. Nails x 10 are well maintained; remaining integument appears unremarkable at this time. There are no open sores, no preulcerative lesions, no rash or signs of infection present.  Vascular: Dorsalis Pedis artery and  Posterior Tibial artery pedal pulses are 2/4 bilateral with immedate capillary fill time. Pedal hair growth present. No varicosities and no lower extremity edema present bilateral.   Neruologic: Grossly intact via light touch bilateral. Vibratory intact via tuning fork bilateral. Protective threshold with Semmes Wienstein monofilament intact to all pedal sites bilateral. Patellar and Achilles deep tendon reflexes 2+ bilateral. No Babinski or clonus noted bilateral.   Musculoskeletal: No gross boney pedal deformities bilateral. No pain, crepitus, or limitation noted with foot and ankle range of motion bilateral. Muscular strength 5/5 in all groups tested bilateral.  Gait: Unassisted, Nonantalgic.    Radiographs:  Demonstrate no acute findings soft tissue increase in density plantar fashion calcaneal insertion site indicative of chronic proximal plantar fasciitis.  No fractures identified.    Assessment & Plan:   Assessment: Plantar fasciitis bilateral.  I  Plan: After sterile Betadine skin prep I injected medial calcaneal tubercle bilateral 20 mg Kenalog 5 mg Marcaine.  Discussed appropriate shoe gear stretching exercise ice therapy as your modifications.  Start her on a Medrol Dosepak to be followed by Celebrex.  I also recommended tramadol to assist in her symptoms.     Max T. [place], [place]

## 2018-01-09 DIAGNOSIS — R490 Dysphonia: Secondary | ICD-10-CM | POA: Diagnosis not present

## 2018-01-09 DIAGNOSIS — J0100 Acute maxillary sinusitis, unspecified: Secondary | ICD-10-CM | POA: Diagnosis not present

## 2018-01-09 DIAGNOSIS — H6123 Impacted cerumen, bilateral: Secondary | ICD-10-CM | POA: Diagnosis not present

## 2018-01-09 DIAGNOSIS — K219 Gastro-esophageal reflux disease without esophagitis: Secondary | ICD-10-CM | POA: Diagnosis not present

## 2018-01-14 ENCOUNTER — Other Ambulatory Visit: Payer: Self-pay | Admitting: Internal Medicine

## 2018-01-15 DIAGNOSIS — M5416 Radiculopathy, lumbar region: Secondary | ICD-10-CM | POA: Diagnosis not present

## 2018-01-15 DIAGNOSIS — M9903 Segmental and somatic dysfunction of lumbar region: Secondary | ICD-10-CM | POA: Diagnosis not present

## 2018-01-15 DIAGNOSIS — M9902 Segmental and somatic dysfunction of thoracic region: Secondary | ICD-10-CM | POA: Diagnosis not present

## 2018-01-15 DIAGNOSIS — M5134 Other intervertebral disc degeneration, thoracic region: Secondary | ICD-10-CM | POA: Diagnosis not present

## 2018-01-29 DIAGNOSIS — L853 Xerosis cutis: Secondary | ICD-10-CM | POA: Diagnosis not present

## 2018-01-29 DIAGNOSIS — L738 Other specified follicular disorders: Secondary | ICD-10-CM | POA: Diagnosis not present

## 2018-01-29 DIAGNOSIS — L821 Other seborrheic keratosis: Secondary | ICD-10-CM | POA: Diagnosis not present

## 2018-01-29 DIAGNOSIS — L578 Other skin changes due to chronic exposure to nonionizing radiation: Secondary | ICD-10-CM | POA: Diagnosis not present

## 2018-01-29 DIAGNOSIS — Z872 Personal history of diseases of the skin and subcutaneous tissue: Secondary | ICD-10-CM | POA: Diagnosis not present

## 2018-01-29 DIAGNOSIS — Z1283 Encounter for screening for malignant neoplasm of skin: Secondary | ICD-10-CM | POA: Diagnosis not present

## 2018-01-29 DIAGNOSIS — L905 Scar conditions and fibrosis of skin: Secondary | ICD-10-CM | POA: Diagnosis not present

## 2018-01-29 DIAGNOSIS — B078 Other viral warts: Secondary | ICD-10-CM | POA: Diagnosis not present

## 2018-01-29 DIAGNOSIS — Z859 Personal history of malignant neoplasm, unspecified: Secondary | ICD-10-CM | POA: Diagnosis not present

## 2018-01-29 DIAGNOSIS — L570 Actinic keratosis: Secondary | ICD-10-CM | POA: Diagnosis not present

## 2018-01-29 DIAGNOSIS — Z85828 Personal history of other malignant neoplasm of skin: Secondary | ICD-10-CM | POA: Diagnosis not present

## 2018-02-04 ENCOUNTER — Telehealth: Payer: Self-pay

## 2018-02-04 NOTE — Telephone Encounter (Signed)
Copied from [id number]. Topic: Inquiry >> Feb 04, 2018  3:30 PM Pricilla Handler wrote: Reason for CRM: Patient called returning a call to [place]. Please call patient back.       Thank You!!!

## 2018-02-05 ENCOUNTER — Encounter: Payer: Self-pay | Admitting: *Deleted

## 2018-02-05 NOTE — Telephone Encounter (Signed)
Called patient back and gave lab results.

## 2018-02-10 ENCOUNTER — Ambulatory Visit: Payer: PPO | Admitting: Podiatry

## 2018-02-12 ENCOUNTER — Ambulatory Visit (INDEPENDENT_AMBULATORY_CARE_PROVIDER_SITE_OTHER): Payer: PPO | Admitting: Podiatry

## 2018-02-12 ENCOUNTER — Encounter: Payer: Self-pay | Admitting: Podiatry

## 2018-02-12 DIAGNOSIS — M722 Plantar fascial fibromatosis: Secondary | ICD-10-CM | POA: Diagnosis not present

## 2018-02-12 NOTE — Progress Notes (Signed)
She presents today for follow-up of plantar fasciitis bilateral states that they are doing much better about 80% though there still has some tenderness.  Objective: Vital signs are stable alert and oriented x3.  Pulses are palpable.  Has pain on palpation medial calcaneal tubercle to the bilateral heel.  Assessment: Pain in limb secondary to plantar fasciitis bilateral.  Plan: Injected bilateral heels today 20 mg Kenalog 5 mg Marcaine bilaterally she tolerated procedure well without complications recommend she continue all conservative therapies including her Celebrex.  Follow-up with her in 1 month if necessary.

## 2018-02-18 ENCOUNTER — Other Ambulatory Visit: Payer: Self-pay | Admitting: Internal Medicine

## 2018-02-19 DIAGNOSIS — M5416 Radiculopathy, lumbar region: Secondary | ICD-10-CM | POA: Diagnosis not present

## 2018-02-19 DIAGNOSIS — M9902 Segmental and somatic dysfunction of thoracic region: Secondary | ICD-10-CM | POA: Diagnosis not present

## 2018-02-19 DIAGNOSIS — M9903 Segmental and somatic dysfunction of lumbar region: Secondary | ICD-10-CM | POA: Diagnosis not present

## 2018-02-19 DIAGNOSIS — M5134 Other intervertebral disc degeneration, thoracic region: Secondary | ICD-10-CM | POA: Diagnosis not present

## 2018-03-07 ENCOUNTER — Ambulatory Visit (INDEPENDENT_AMBULATORY_CARE_PROVIDER_SITE_OTHER): Payer: PPO | Admitting: Internal Medicine

## 2018-03-07 VITALS — BP 138/78 | HR 78 | Temp 97.6°F | Resp 18 | Wt 131.8 lb

## 2018-03-07 DIAGNOSIS — I10 Essential (primary) hypertension: Secondary | ICD-10-CM | POA: Diagnosis not present

## 2018-03-07 DIAGNOSIS — F419 Anxiety disorder, unspecified: Secondary | ICD-10-CM | POA: Diagnosis not present

## 2018-03-07 DIAGNOSIS — D649 Anemia, unspecified: Secondary | ICD-10-CM

## 2018-03-07 DIAGNOSIS — J4520 Mild intermittent asthma, uncomplicated: Secondary | ICD-10-CM

## 2018-03-07 DIAGNOSIS — K219 Gastro-esophageal reflux disease without esophagitis: Secondary | ICD-10-CM | POA: Diagnosis not present

## 2018-03-07 DIAGNOSIS — Z23 Encounter for immunization: Secondary | ICD-10-CM | POA: Diagnosis not present

## 2018-03-07 DIAGNOSIS — E7800 Pure hypercholesterolemia, unspecified: Secondary | ICD-10-CM | POA: Diagnosis not present

## 2018-03-07 NOTE — Patient Instructions (Signed)
Restart hctz 1/2 tablet per day

## 2018-03-07 NOTE — Progress Notes (Signed)
Patient ID: Patricia Pacheco, female   DOB: 04-29-1941, 77 y.o.   MRN: [id number]   Subjective:    Patient ID: Patricia Pacheco, female    DOB: 1941-03-26, 77 y.o.   MRN: [id number]  HPI  Patient here for a scheduled follow up.  She reports she is doing relatively well.  She recently stopped amlodipine.  Noticed swelling.  Had benicar and tolerated at first.  Stopped secondary to intolerance.  Blood pressures averaging 130-140s/70s.  Tries to stay active.  No chest pain.  No sob.  No acid reflux.  No abdominal pain.  Bowels moving.  Taking celebrex.  States needs.  Discussed can elevate blood pressure.  Also discussed other side effects and risk of medication.  Breathing stable  - on singulair.     Past Medical History:  Diagnosis Date  . Allergy    allergy shots (Juengel)  . Arthritis   . Asthma   . CAD (coronary artery disease)    cath 11/27/04 - moderated LAD disease  . Cancer ([place])    skin  . GERD (gastroesophageal reflux disease)    EGD (05/25/04) - slight presbyesophagus, mild esophagitis, mild gastritis, mild duodenitis - positive H. pylori.   Marland Kitchen Neuropathy    Past Surgical History:  Procedure Laterality Date  . APPENDECTOMY  1967   Family History  Problem Relation Age of Onset  . Arthritis Mother   . Colon polyps Sister   . Breast cancer Neg Hx    Social History   Socioeconomic History  . Marital status: Married    Spouse name: Not on file  . Number of children: Not on file  . Years of education: Not on file  . Highest education level: Not on file  Occupational History  . Not on file  Social Needs  . Financial resource strain: Not on file  . Food insecurity:    Worry: Never true    Inability: Never true  . Transportation needs:    Medical: No    Non-medical: No  Tobacco Use  . Smoking status: Never Smoker  . Smokeless tobacco: Never Used  Substance and Sexual Activity  . Alcohol use: No    Alcohol/week: 0.0 standard drinks  . Drug use: No  . Sexual activity: Not  on file  Lifestyle  . Physical activity:    Days per week: Not on file    Minutes per session: Not on file  . Stress: Not at all  Relationships  . Social connections:    Talks on phone: Not on file    Gets together: Not on file    Attends religious service: Not on file    Active member of club or organization: Not on file    Attends meetings of clubs or organizations: Not on file    Relationship status: Not on file  Other Topics Concern  . Not on file  Social History Narrative  . Not on file    Outpatient Encounter Medications as of 03/07/2018  Medication Sig  . ALPRAZolam (XANAX) 0.25 MG tablet TAKE ONE TABLET EVERY DAY AS NEEDED FOR ANXIETY  . antiseptic oral rinse (BIOTENE) LIQD 15 mLs by Mouth Rinse route as needed for dry mouth.  Marland Kitchen aspirin 81 MG tablet Take 81 mg by mouth daily.  . Calcium Carbonate-Vit D-Min (CALCIUM 1200 PO) Take by mouth daily.  . celecoxib (CELEBREX) 200 MG capsule Take 1 capsule (200 mg total) by mouth daily.  . COD LIVER OIL PO Take by  mouth.  . Coenzyme Q10 (CO Q-10) 100 MG CAPS Take 1 capsule by mouth daily.  Marland Kitchen docusate sodium (COLACE) 100 MG capsule Take by mouth.  . fexofenadine (ALLEGRA) 180 MG tablet Take 1 tablet (180 mg total) by mouth daily as needed for allergies or rhinitis.  . fluocinolone (SYNALAR) 0.01 % external solution Apply topically 2 (two) times daily.  Marland Kitchen FLUoxetine (PROZAC) 10 MG capsule TAKE 1 CAPSULE EVERY DAY  . mometasone (ELOCON) 0.1 % lotion Apply 1 application topically as directed.  . montelukast (SINGULAIR) 10 MG tablet Take 10 mg by mouth at bedtime.  . Multiple Vitamin (MULTIVITAMIN) tablet Take 1 tablet by mouth daily.  . mupirocin ointment (BACTROBAN) 2 % Place 1 application into the nose 2 (two) times daily.  . Omega-3 Fatty Acids (FISH OIL PO) Take by mouth daily.  Marland Kitchen omeprazole (PRILOSEC) 40 MG capsule TAKE 1 CAPSULE BY MOUTH DAILY  . silver sulfADIAZINE (SILVADENE) 1 % cream Apply 1 application topically daily.  .  traMADol (ULTRAM) 50 MG tablet Take 1 tablet (50 mg total) by mouth every 8 (eight) hours as needed.  . triamcinolone cream (KENALOG) 0.1 %   . UNABLE TO FIND   . [DISCONTINUED] amLODipine (NORVASC) 5 MG tablet Take 1 tablet (5 mg total) by mouth daily.  . [DISCONTINUED] hydrochlorothiazide (HYDRODIURIL) 25 MG tablet TAKE 1/2 TABLET EVERY DAY  . [DISCONTINUED] olmesartan (BENICAR) 20 MG tablet Take 1 tablet (20 mg total) by mouth daily.   No facility-administered encounter medications on file as of 03/07/2018.     Review of Systems  Constitutional: Negative for appetite change and unexpected weight change.  HENT: Negative for congestion and sinus pressure.   Respiratory: Negative for cough, chest tightness and shortness of breath.   Cardiovascular: Negative for chest pain, palpitations and leg swelling.  Gastrointestinal: Negative for abdominal pain, diarrhea, nausea and vomiting.  Genitourinary: Negative for difficulty urinating and dysuria.  Musculoskeletal: Negative for joint swelling and myalgias.  Skin: Negative for color change and rash.  Neurological: Negative for dizziness, light-headedness and headaches.  Psychiatric/Behavioral: Negative for agitation and dysphoric mood.       Objective:     Blood pressure rechecked by me:  146/78  Physical Exam  Constitutional: She appears well-developed and well-nourished. No distress.  HENT:  Nose: Nose normal.  Mouth/Throat: Oropharynx is clear and moist.  Neck: Neck supple. No thyromegaly present.  Cardiovascular: Normal rate and regular rhythm.  Pulmonary/Chest: Breath sounds normal. No respiratory distress. She has no wheezes.  Abdominal: Soft. Bowel sounds are normal. There is no tenderness.  Musculoskeletal: She exhibits no edema or tenderness.  Lymphadenopathy:    She has no cervical adenopathy.  Skin: No rash noted. No erythema.  Psychiatric: She has a normal mood and affect. Her behavior is normal.    BP 138/78 (BP  Location: Left Arm, Patient Position: Sitting, Cuff Size: Normal)   Pulse 78   Temp 97.6 F (36.4 C) (Oral)   Resp 18   Wt 131 lb 12.8 oz (59.8 kg)   LMP 08/29/1995   SpO2 97%   BMI 25.11 kg/m  Wt Readings from Last 3 Encounters:  03/07/18 131 lb 12.8 oz (59.8 kg)  12/16/17 134 lb (60.8 kg)  12/16/17 134 lb 12.8 oz (61.1 kg)     Lab Results  Component Value Date   WBC 5.1 10/04/2017   HGB 11.9 (L) 10/04/2017   HCT 35.7 (L) 10/04/2017   PLT 222.0 10/04/2017   GLUCOSE 88 12/16/2017  CHOL 196 12/16/2017   TRIG 95.0 12/16/2017   HDL 61.10 12/16/2017   LDLDIRECT 115.0 09/04/2012   LDLCALC 116 (H) 12/16/2017   ALT 11 12/16/2017   AST 16 12/16/2017   NA 140 12/16/2017   K 3.9 12/16/2017   CL 103 12/16/2017   CREATININE 0.83 12/16/2017   BUN 19 12/16/2017   CO2 30 12/16/2017   TSH 1.63 12/16/2017   HGBA1C 6.1 10/04/2017    Mm Screening Breast Tomo Bilateral  Result Date: 05/28/2017 CLINICAL DATA:  Screening. EXAM: 2D DIGITAL SCREENING BILATERAL MAMMOGRAM WITH CAD AND ADJUNCT TOMO COMPARISON:  Previous exam(s). ACR Breast Density Category b: There are scattered areas of fibroglandular density. FINDINGS: There are no findings suspicious for malignancy. Images were processed with CAD. IMPRESSION: No mammographic evidence of malignancy. A result letter of this screening mammogram will be mailed directly to the patient. RECOMMENDATION: Screening mammogram in one year. (Code:SM-B-01Y) BI-RADS CATEGORY  1: Negative. Electronically Signed   By: Franki Cabot M.D.   On: 05/28/2017 13:34       Assessment & Plan:   Problem List Items Addressed This Visit    Anxiety    Stable on current regimen.  Follow. May consider taper of prozac.        Asthma    Breathing stable. On singulair.        Essential hypertension    Has had problems with multiple medications.  Lisinopril caused cough.  Amlodipine caused swelling.  Losartan caused diarrhea and swelling.  Intolerance to avapro,  hydralazine and benicar.  Reported previous aching with hctz.  Not sure aching from hctz.  Wants to restart.  Will start hctz 12.5 mg q day.  Follow pressures.  Follow metabolic panel.        Relevant Orders   Basic metabolic panel   GERD (gastroesophageal reflux disease)    Controlled on current regimen.  Follow.        Hypercholesterolemia    Desires not to take cholesterol medication.  Low cholesterol diet and exercise.  Follow lipid panel.        Relevant Orders   Hepatic function panel   Lipid panel    Other Visit Diagnoses    Need for influenza vaccination    -  Primary   Relevant Orders   Flu vaccine HIGH DOSE PF (Fluzone High dose) (Completed)   Anemia, unspecified type       Relevant Orders   CBC with Differential/Platelet   Ferritin       Einar Pheasant, MD

## 2018-03-09 ENCOUNTER — Encounter: Payer: Self-pay | Admitting: Internal Medicine

## 2018-03-09 NOTE — Assessment & Plan Note (Signed)
Breathing stable. On singulair.   

## 2018-03-09 NOTE — Assessment & Plan Note (Signed)
Desires not to take cholesterol medication.  Low cholesterol diet and exercise.  Follow lipid panel.   

## 2018-03-09 NOTE — Assessment & Plan Note (Signed)
Has had problems with multiple medications.  Lisinopril caused cough.  Amlodipine caused swelling.  Losartan caused diarrhea and swelling.  Intolerance to avapro, hydralazine and benicar.  Reported previous aching with hctz.  Not sure aching from hctz.  Wants to restart.  Will start hctz 12.5 mg q day.  Follow pressures.  Follow metabolic panel.

## 2018-03-09 NOTE — Assessment & Plan Note (Signed)
Stable on current regimen.  Follow. May consider taper of prozac.

## 2018-03-09 NOTE — Assessment & Plan Note (Signed)
Controlled on current regimen.  Follow.  

## 2018-03-19 DIAGNOSIS — M9903 Segmental and somatic dysfunction of lumbar region: Secondary | ICD-10-CM | POA: Diagnosis not present

## 2018-03-19 DIAGNOSIS — M5416 Radiculopathy, lumbar region: Secondary | ICD-10-CM | POA: Diagnosis not present

## 2018-03-19 DIAGNOSIS — M9902 Segmental and somatic dysfunction of thoracic region: Secondary | ICD-10-CM | POA: Diagnosis not present

## 2018-03-19 DIAGNOSIS — M5134 Other intervertebral disc degeneration, thoracic region: Secondary | ICD-10-CM | POA: Diagnosis not present

## 2018-04-09 ENCOUNTER — Encounter: Payer: Self-pay | Admitting: Podiatry

## 2018-04-09 ENCOUNTER — Ambulatory Visit: Payer: PPO | Admitting: Podiatry

## 2018-04-09 DIAGNOSIS — M722 Plantar fascial fibromatosis: Secondary | ICD-10-CM | POA: Diagnosis not present

## 2018-04-09 NOTE — Progress Notes (Signed)
Presents today for follow-up of plantar fasciitis bilateral states that they have been very painful the past few weeks but today feels a little better.  Objective: Vital signs are stable alert and oriented x3.  Pulses are palpable.  Pain on palpation medial calcaneal tubercles bilateral severe fat atrophy bilateral.  Assessment: Plantar fasciitis bilateral heel.  Fat atrophy.  Plan: After sterile Betadine skin prep I injected 10 mg of Kenalog 5 mg Marcaine to the deeper structures deep to the plantar fascia near its insertion site.  This should help alleviate her symptoms to some degree.  Follow-up with her 2 to 3 months.

## 2018-04-16 DIAGNOSIS — M5134 Other intervertebral disc degeneration, thoracic region: Secondary | ICD-10-CM | POA: Diagnosis not present

## 2018-04-16 DIAGNOSIS — M9903 Segmental and somatic dysfunction of lumbar region: Secondary | ICD-10-CM | POA: Diagnosis not present

## 2018-04-16 DIAGNOSIS — M5416 Radiculopathy, lumbar region: Secondary | ICD-10-CM | POA: Diagnosis not present

## 2018-04-16 DIAGNOSIS — M9902 Segmental and somatic dysfunction of thoracic region: Secondary | ICD-10-CM | POA: Diagnosis not present

## 2018-05-13 ENCOUNTER — Other Ambulatory Visit: Payer: Self-pay | Admitting: Internal Medicine

## 2018-05-13 ENCOUNTER — Other Ambulatory Visit: Payer: Self-pay | Admitting: Podiatry

## 2018-05-14 DIAGNOSIS — M5134 Other intervertebral disc degeneration, thoracic region: Secondary | ICD-10-CM | POA: Diagnosis not present

## 2018-05-14 DIAGNOSIS — M9902 Segmental and somatic dysfunction of thoracic region: Secondary | ICD-10-CM | POA: Diagnosis not present

## 2018-05-14 DIAGNOSIS — M9903 Segmental and somatic dysfunction of lumbar region: Secondary | ICD-10-CM | POA: Diagnosis not present

## 2018-05-14 DIAGNOSIS — M5416 Radiculopathy, lumbar region: Secondary | ICD-10-CM | POA: Diagnosis not present

## 2018-05-15 ENCOUNTER — Other Ambulatory Visit (INDEPENDENT_AMBULATORY_CARE_PROVIDER_SITE_OTHER): Payer: PPO

## 2018-05-15 DIAGNOSIS — E7800 Pure hypercholesterolemia, unspecified: Secondary | ICD-10-CM

## 2018-05-15 DIAGNOSIS — I10 Essential (primary) hypertension: Secondary | ICD-10-CM

## 2018-05-15 DIAGNOSIS — D649 Anemia, unspecified: Secondary | ICD-10-CM

## 2018-05-15 LAB — CBC WITH DIFFERENTIAL/PLATELET
BASOS ABS: 0.1 10*3/uL (ref 0.0–0.1)
Basophils Relative: 1.1 % (ref 0.0–3.0)
EOS ABS: 0.2 10*3/uL (ref 0.0–0.7)
Eosinophils Relative: 2.7 % (ref 0.0–5.0)
HCT: 35.7 % — ABNORMAL LOW (ref 36.0–46.0)
Hemoglobin: 11.9 g/dL — ABNORMAL LOW (ref 12.0–15.0)
Lymphocytes Relative: 37.3 % (ref 12.0–46.0)
Lymphs Abs: 2.1 10*3/uL (ref 0.7–4.0)
MCHC: 33.3 g/dL (ref 30.0–36.0)
MCV: 91.8 fl (ref 78.0–100.0)
Monocytes Absolute: 0.3 10*3/uL (ref 0.1–1.0)
Monocytes Relative: 5.6 % (ref 3.0–12.0)
NEUTROS ABS: 3 10*3/uL (ref 1.4–7.7)
Neutrophils Relative %: 53.3 % (ref 43.0–77.0)
Platelets: 229 10*3/uL (ref 150.0–400.0)
RBC: 3.89 Mil/uL (ref 3.87–5.11)
RDW: 13.4 % (ref 11.5–15.5)
WBC: 5.5 10*3/uL (ref 4.0–10.5)

## 2018-05-15 LAB — LIPID PANEL
CHOL/HDL RATIO: 3
Cholesterol: 206 mg/dL — ABNORMAL HIGH (ref 0–200)
HDL: 71.4 mg/dL (ref >39.00)
LDL Cholesterol: 122 mg/dL — ABNORMAL HIGH (ref 0–99)
NonHDL: 134.53
TRIGLYCERIDES: 63 mg/dL (ref 0.0–149.0)
VLDL: 12.6 mg/dL (ref 0.0–40.0)

## 2018-05-15 LAB — BASIC METABOLIC PANEL
BUN: 18 mg/dL (ref 6–23)
CO2: 31 mEq/L (ref 19–32)
Calcium: 9.2 mg/dL (ref 8.4–10.5)
Chloride: 102 mEq/L (ref 96–112)
Creatinine, Ser: 0.94 mg/dL (ref 0.40–1.20)
GFR: 61.1 mL/min (ref >60.00)
GLUCOSE: 90 mg/dL (ref 70–99)
Potassium: 3.7 mEq/L (ref 3.5–5.1)
SODIUM: 141 meq/L (ref 135–145)

## 2018-05-15 LAB — HEPATIC FUNCTION PANEL
ALBUMIN: 4.1 g/dL (ref 3.5–5.2)
ALK PHOS: 83 U/L (ref 39–117)
ALT: 13 U/L (ref 0–35)
AST: 17 U/L (ref 0–37)
Bilirubin, Direct: 0.1 mg/dL (ref 0.0–0.3)
TOTAL PROTEIN: 6.8 g/dL (ref 6.0–8.3)
Total Bilirubin: 0.4 mg/dL (ref 0.2–1.2)

## 2018-05-15 LAB — FERRITIN: Ferritin: 27.9 ng/mL (ref 10.0–291.0)

## 2018-05-19 ENCOUNTER — Encounter: Payer: Self-pay | Admitting: Internal Medicine

## 2018-05-19 ENCOUNTER — Ambulatory Visit (INDEPENDENT_AMBULATORY_CARE_PROVIDER_SITE_OTHER): Payer: PPO | Admitting: Internal Medicine

## 2018-05-19 VITALS — BP 134/84 | HR 81 | Temp 98.0°F | Resp 18 | Ht 59.0 in | Wt 131.8 lb

## 2018-05-19 DIAGNOSIS — E7800 Pure hypercholesterolemia, unspecified: Secondary | ICD-10-CM | POA: Diagnosis not present

## 2018-05-19 DIAGNOSIS — I10 Essential (primary) hypertension: Secondary | ICD-10-CM | POA: Diagnosis not present

## 2018-05-19 DIAGNOSIS — F419 Anxiety disorder, unspecified: Secondary | ICD-10-CM

## 2018-05-19 DIAGNOSIS — K219 Gastro-esophageal reflux disease without esophagitis: Secondary | ICD-10-CM | POA: Diagnosis not present

## 2018-05-19 DIAGNOSIS — J4520 Mild intermittent asthma, uncomplicated: Secondary | ICD-10-CM | POA: Diagnosis not present

## 2018-05-19 DIAGNOSIS — Z0000 Encounter for general adult medical examination without abnormal findings: Secondary | ICD-10-CM

## 2018-05-19 MED ORDER — EZETIMIBE 10 MG PO TABS: 10.0000 mg | ORAL_TABLET | Freq: Every day | ORAL | 2 refills | Status: DC

## 2018-05-19 MED ORDER — HYDROCHLOROTHIAZIDE 12.5 MG PO CAPS: 12.5000 mg | ORAL_CAPSULE | Freq: Every day | ORAL | 2 refills | Status: DC

## 2018-05-19 NOTE — Progress Notes (Addendum)
Patient ID: Patricia Pacheco, female   DOB: 1941-01-06, 77 y.o.   MRN: [id number]   Subjective:    Patient ID: Patricia Pacheco, female    DOB: [birth year], 77 y.o.   MRN: [id number]  HPI  Patient here for a complete physical exam.  Just evaluated recently by podiatry.  Diagnosed with plantar fasciitis.  S/p injection.  10 days ago fell while shopping.  Tripped.  No significant injury.  Right anterior shin - healing.  Able to walk without difficulty.  No chest pain.  No sob.  No acid reflux.  No abdominal pain.  Taking her hctz prn.  Discussed blood pressures.  Discussed taking low dose (12.5mg ) daily.  Overall tolerating the medication.     Past Medical History:  Diagnosis Date  . Allergy    allergy shots (Juengel)  . Arthritis   . Asthma   . CAD (coronary artery disease)    cath 11/27/04 - moderated LAD disease  . Cancer ([place])    skin  . GERD (gastroesophageal reflux disease)    EGD (05/25/04) - slight presbyesophagus, mild esophagitis, mild gastritis, mild duodenitis - positive H. pylori.   Marland Kitchen Neuropathy    Past Surgical History:  Procedure Laterality Date  . APPENDECTOMY  1967   Family History  Problem Relation Age of Onset  . Arthritis Mother   . Colon polyps Sister   . Breast cancer Neg Hx    Social History   Socioeconomic History  . Marital status: Married    Spouse name: Not on file  . Number of children: Not on file  . Years of education: Not on file  . Highest education level: Not on file  Occupational History  . Not on file  Social Needs  . Financial resource strain: Not on file  . Food insecurity:    Worry: Never true    Inability: Never true  . Transportation needs:    Medical: No    Non-medical: No  Tobacco Use  . Smoking status: Never Smoker  . Smokeless tobacco: Never Used  Substance and Sexual Activity  . Alcohol use: No    Alcohol/week: 0.0 standard drinks  . Drug use: No  . Sexual activity: Not on file  Lifestyle  . Physical activity:    Days per  week: Not on file    Minutes per session: Not on file  . Stress: Not at all  Relationships  . Social connections:    Talks on phone: Not on file    Gets together: Not on file    Attends religious service: Not on file    Active member of club or organization: Not on file    Attends meetings of clubs or organizations: Not on file    Relationship status: Not on file  Other Topics Concern  . Not on file  Social History Narrative  . Not on file    Outpatient Encounter Medications as of 05/19/2018  Medication Sig  . ALPRAZolam (XANAX) 0.25 MG tablet TAKE ONE TABLET EVERY DAY AS NEEDED FOR ANXIETY  . antiseptic oral rinse (BIOTENE) LIQD 15 mLs by Mouth Rinse route as needed for dry mouth.  Marland Kitchen aspirin 81 MG tablet Take 81 mg by mouth daily.  . Calcium Carbonate-Vit D-Min (CALCIUM 1200 PO) Take by mouth daily.  . celecoxib (CELEBREX) 200 MG capsule TAKE 1 CAPSULE BY MOUTH EVERY DAY  . COD LIVER OIL PO Take by mouth.  . Coenzyme Q10 (CO Q-10) 100 MG CAPS Take 1  capsule by mouth daily.  Marland Kitchen docusate sodium (COLACE) 100 MG capsule Take by mouth.  . fexofenadine (ALLEGRA) 180 MG tablet Take 1 tablet (180 mg total) by mouth daily as needed for allergies or rhinitis.  . fluocinolone (SYNALAR) 0.01 % external solution Apply topically 2 (two) times daily.  Marland Kitchen FLUoxetine (PROZAC) 10 MG capsule TAKE 1 CAPSULE BY MOUTH EVERY DAY  . mometasone (ELOCON) 0.1 % lotion Apply 1 application topically as directed.  . montelukast (SINGULAIR) 10 MG tablet Take 10 mg by mouth at bedtime.  . Multiple Vitamin (MULTIVITAMIN) tablet Take 1 tablet by mouth daily.  . mupirocin ointment (BACTROBAN) 2 % Place 1 application into the nose 2 (two) times daily.  . Omega-3 Fatty Acids (FISH OIL PO) Take by mouth daily.  Marland Kitchen omeprazole (PRILOSEC) 40 MG capsule TAKE 1 CAPSULE BY MOUTH DAILY  . silver sulfADIAZINE (SILVADENE) 1 % cream Apply 1 application topically daily.  . traMADol (ULTRAM) 50 MG tablet Take 1 tablet (50 mg  total) by mouth every 8 (eight) hours as needed.  . triamcinolone cream (KENALOG) 0.1 %   . UNABLE TO FIND   . [DISCONTINUED] hydrochlorothiazide (HYDRODIURIL) 25 MG tablet Take 25 mg by mouth daily.  Marland Kitchen ezetimibe (ZETIA) 10 MG tablet Take 1 tablet (10 mg total) by mouth daily.  . hydrochlorothiazide (MICROZIDE) 12.5 MG capsule Take 1 capsule (12.5 mg total) by mouth daily.   No facility-administered encounter medications on file as of 05/19/2018.     Review of Systems  Constitutional: Negative for appetite change and unexpected weight change.  HENT: Negative for congestion and sinus pressure.   Respiratory: Negative for cough, chest tightness and shortness of breath.   Cardiovascular: Negative for chest pain, palpitations and leg swelling.  Gastrointestinal: Negative for abdominal pain, diarrhea, nausea and vomiting.  Genitourinary: Negative for difficulty urinating and dysuria.  Musculoskeletal: Negative for joint swelling and myalgias.  Skin: Negative for color change and rash.  Neurological: Negative for dizziness, light-headedness and headaches.  Psychiatric/Behavioral: Negative for agitation and dysphoric mood.       Objective:    Physical Exam  Constitutional: She is oriented to person, place, and time. She appears well-developed and well-nourished. No distress.  HENT:  Nose: Nose normal.  Mouth/Throat: Oropharynx is clear and moist.  Eyes: Right eye exhibits no discharge. Left eye exhibits no discharge. No scleral icterus.  Neck: Neck supple. No thyromegaly present.  Cardiovascular: Normal rate and regular rhythm.  Pulmonary/Chest: Breath sounds normal. No accessory muscle usage. No tachypnea. No respiratory distress. She has no decreased breath sounds. She has no wheezes. She has no rhonchi. Right breast exhibits no inverted nipple, no mass, no nipple discharge and no tenderness (no axillary adenopathy). Left breast exhibits no inverted nipple, no mass, no nipple discharge  and no tenderness (no axilarry adenopathy).  Abdominal: Soft. Bowel sounds are normal. There is no tenderness.  Musculoskeletal: She exhibits no edema or tenderness.  Lymphadenopathy:    She has no cervical adenopathy.  Neurological: She is alert and oriented to person, place, and time.  Skin: No rash noted. No erythema.  Psychiatric: She has a normal mood and affect. Her behavior is normal.    BP 134/84   Pulse 81   Temp 98 F (36.7 C) (Oral)   Resp 18   Ht 4\' 11"  (1.499 m)   Wt 131 lb 12.8 oz (59.8 kg)   LMP 08/29/1995   SpO2 98%   BMI 26.62 kg/m  Wt Readings from Last  3 Encounters:  05/19/18 131 lb 12.8 oz (59.8 kg)  03/07/18 131 lb 12.8 oz (59.8 kg)  12/16/17 134 lb (60.8 kg)     Lab Results  Component Value Date   WBC 5.5 05/15/2018   HGB 11.9 (L) 05/15/2018   HCT 35.7 (L) 05/15/2018   PLT 229.0 05/15/2018   GLUCOSE 90 05/15/2018   CHOL 206 (H) 05/15/2018   TRIG 63.0 05/15/2018   HDL 71.40 05/15/2018   LDLDIRECT 115.0 09/04/2012   LDLCALC 122 (H) 05/15/2018   ALT 13 05/15/2018   AST 17 05/15/2018   NA 141 05/15/2018   K 3.7 05/15/2018   CL 102 05/15/2018   CREATININE 0.94 05/15/2018   BUN 18 05/15/2018   CO2 31 05/15/2018   TSH 1.63 12/16/2017   HGBA1C 6.1 10/04/2017    Mm Screening Breast Tomo Bilateral  Result Date: 05/28/2017 CLINICAL DATA:  Screening. EXAM: 2D DIGITAL SCREENING BILATERAL MAMMOGRAM WITH CAD AND ADJUNCT TOMO COMPARISON:  Previous exam(s). ACR Breast Density Category b: There are scattered areas of fibroglandular density. FINDINGS: There are no findings suspicious for malignancy. Images were processed with CAD. IMPRESSION: No mammographic evidence of malignancy. A result letter of this screening mammogram will be mailed directly to the patient. RECOMMENDATION: Screening mammogram in one year. (Code:SM-B-01Y) BI-RADS CATEGORY  1: Negative. Electronically Signed   By: Franki Cabot M.D.   On: 05/28/2017 13:34       Assessment & Plan:    Problem List Items Addressed This Visit    Anxiety    On prozac.  Stable.  Follow.        Asthma    Breathing stable. On singulair.        Essential hypertension    Has had problems with multiple medications.  Lisinopril caused cough.  Amlodipine caused swelling.  Losartan caused diarrhea and swelling.  Intolerance to avapro, hydralazine and benicar.  Now on hctz and tolerating.  Blood pressure doing better.  Continue hctz 12.5mg  q day.  Follow pressures.  Follow metabolic panel.        Relevant Medications   ezetimibe (ZETIA) 10 MG tablet   hydrochlorothiazide (MICROZIDE) 12.5 MG capsule   GERD (gastroesophageal reflux disease)    Controlled on current regimen.  Follow.        Health care maintenance    Physical today 05/19/18.  Mammogram 05/28/17 - Birads I.  Colonoscopy 10/08/13 - sigmoid diverticulosis otherwise normal.        Hypercholesterolemia    Desires not to take cholesterol medication.  Low cholesterol diet and exercise.  Follow lipid panel.        Relevant Medications   ezetimibe (ZETIA) 10 MG tablet   hydrochlorothiazide (MICROZIDE) 12.5 MG capsule    Other Visit Diagnoses    Routine general medical examination at a health care facility    -  Primary       Einar Pheasant, MD

## 2018-05-25 ENCOUNTER — Encounter: Payer: Self-pay | Admitting: Internal Medicine

## 2018-05-25 NOTE — Assessment & Plan Note (Signed)
Physical today 05/19/18.  Mammogram 05/28/17 - Birads I.  Colonoscopy 10/08/13 - sigmoid diverticulosis otherwise normal.

## 2018-05-25 NOTE — Assessment & Plan Note (Signed)
Desires not to take cholesterol medication.  Low cholesterol diet and exercise.  Follow lipid panel.

## 2018-05-25 NOTE — Assessment & Plan Note (Signed)
Has had problems with multiple medications.  Lisinopril caused cough.  Amlodipine caused swelling.  Losartan caused diarrhea and swelling.  Intolerance to avapro, hydralazine and benicar.  Now on hctz and tolerating.  Blood pressure doing better.  Continue hctz 12.5mg  q day.  Follow pressures.  Follow metabolic panel.

## 2018-05-25 NOTE — Assessment & Plan Note (Signed)
On prozac.  Stable.  Follow.  

## 2018-05-25 NOTE — Addendum Note (Signed)
Addended by: Alisa Graff on: 05/25/2018 07:25 PM   Modules accepted: Level of Service

## 2018-05-25 NOTE — Assessment & Plan Note (Signed)
Controlled on current regimen.  Follow.  

## 2018-05-25 NOTE — Assessment & Plan Note (Signed)
Breathing stable. On singulair.

## 2018-06-09 ENCOUNTER — Other Ambulatory Visit: Payer: Self-pay | Admitting: Podiatry

## 2018-06-11 ENCOUNTER — Ambulatory Visit (INDEPENDENT_AMBULATORY_CARE_PROVIDER_SITE_OTHER): Payer: PPO | Admitting: Podiatry

## 2018-06-11 DIAGNOSIS — M9903 Segmental and somatic dysfunction of lumbar region: Secondary | ICD-10-CM | POA: Diagnosis not present

## 2018-06-11 DIAGNOSIS — M5416 Radiculopathy, lumbar region: Secondary | ICD-10-CM | POA: Diagnosis not present

## 2018-06-11 DIAGNOSIS — M722 Plantar fascial fibromatosis: Secondary | ICD-10-CM | POA: Diagnosis not present

## 2018-06-11 DIAGNOSIS — M5134 Other intervertebral disc degeneration, thoracic region: Secondary | ICD-10-CM | POA: Diagnosis not present

## 2018-06-11 DIAGNOSIS — M9902 Segmental and somatic dysfunction of thoracic region: Secondary | ICD-10-CM | POA: Diagnosis not present

## 2018-06-11 NOTE — Progress Notes (Signed)
She presents today for follow-up of plantar fasciitis this I think is getting a little worse.  Objective: Pulses are palpable.  She has no pain on medial and lateral compression of the calcaneus.  She has fat atrophy forefoot and rear foot.  She has pain on palpation of the medial band of the plantar fascia from the arch proximally to its insertion on the calcaneus.  Assessment: Plantar fasciitis.  Bilateral.  Plan: Refer her to physical therapy for plantar fasciitis treatment as well as gait training.

## 2018-06-12 ENCOUNTER — Other Ambulatory Visit: Payer: Self-pay | Admitting: Podiatry

## 2018-06-23 ENCOUNTER — Other Ambulatory Visit: Payer: Self-pay | Admitting: Internal Medicine

## 2018-06-23 DIAGNOSIS — Z1231 Encounter for screening mammogram for malignant neoplasm of breast: Secondary | ICD-10-CM

## 2018-07-11 ENCOUNTER — Ambulatory Visit
Admission: RE | Admit: 2018-07-11 | Discharge: 2018-07-11 | Disposition: A | Discharge status: Home / Self Care | Payer: PPO | Source: Ambulatory Visit | Attending: Internal Medicine | Admitting: Internal Medicine

## 2018-07-11 DIAGNOSIS — Z1231 Encounter for screening mammogram for malignant neoplasm of breast: Secondary | ICD-10-CM | POA: Insufficient documentation

## 2018-07-14 DIAGNOSIS — M5134 Other intervertebral disc degeneration, thoracic region: Secondary | ICD-10-CM | POA: Diagnosis not present

## 2018-07-14 DIAGNOSIS — M9903 Segmental and somatic dysfunction of lumbar region: Secondary | ICD-10-CM | POA: Diagnosis not present

## 2018-07-14 DIAGNOSIS — M9902 Segmental and somatic dysfunction of thoracic region: Secondary | ICD-10-CM | POA: Diagnosis not present

## 2018-07-14 DIAGNOSIS — M5416 Radiculopathy, lumbar region: Secondary | ICD-10-CM | POA: Diagnosis not present

## 2018-07-30 ENCOUNTER — Other Ambulatory Visit: Payer: Self-pay | Admitting: Family

## 2018-07-30 NOTE — Telephone Encounter (Signed)
I sent in rx for fluoxetine, but need to clarify if pt is still taking tramadol (prescribed by podiatry).  Would prefer, given she is on prozac - to limit (or stop) tramadol.  Let me know if problems.

## 2018-08-01 NOTE — Telephone Encounter (Signed)
LMTCB

## 2018-08-01 NOTE — Telephone Encounter (Signed)
I tried to call patient back when she accidentally either hung up or cut after speaking with [facility]. All I got was a busy signal.

## 2018-08-07 NOTE — Telephone Encounter (Signed)
LMTCB

## 2018-08-08 NOTE — Telephone Encounter (Signed)
Pt is no longer taking tramadol

## 2018-08-28 DIAGNOSIS — J069 Acute upper respiratory infection, unspecified: Secondary | ICD-10-CM | POA: Diagnosis not present

## 2018-08-28 DIAGNOSIS — J029 Acute pharyngitis, unspecified: Secondary | ICD-10-CM | POA: Diagnosis not present

## 2018-08-28 DIAGNOSIS — K219 Gastro-esophageal reflux disease without esophagitis: Secondary | ICD-10-CM | POA: Diagnosis not present

## 2018-08-29 ENCOUNTER — Ambulatory Visit: Payer: PPO | Admitting: Internal Medicine

## 2018-09-05 ENCOUNTER — Other Ambulatory Visit: Payer: Self-pay | Admitting: Internal Medicine

## 2018-09-05 NOTE — Telephone Encounter (Signed)
Last OV 05/19/2018   Last refilled 08/19/2018 disp 30 with no refills   Next appt 12/19/2018   Sent to PCP for approval

## 2018-09-06 NOTE — Telephone Encounter (Signed)
rx ok'd for tramadol #30 with 2 refills.

## 2018-09-12 DIAGNOSIS — J45991 Cough variant asthma: Secondary | ICD-10-CM | POA: Diagnosis not present

## 2018-09-12 DIAGNOSIS — R05 Cough: Secondary | ICD-10-CM | POA: Diagnosis not present

## 2018-10-24 ENCOUNTER — Other Ambulatory Visit: Payer: Self-pay | Admitting: Podiatry

## 2018-11-24 DIAGNOSIS — H43813 Vitreous degeneration, bilateral: Secondary | ICD-10-CM | POA: Diagnosis not present

## 2018-12-19 ENCOUNTER — Other Ambulatory Visit: Payer: Self-pay

## 2018-12-19 ENCOUNTER — Ambulatory Visit (INDEPENDENT_AMBULATORY_CARE_PROVIDER_SITE_OTHER): Payer: PPO | Admitting: Internal Medicine

## 2018-12-19 ENCOUNTER — Encounter: Payer: Self-pay | Admitting: Internal Medicine

## 2018-12-19 ENCOUNTER — Ambulatory Visit: Payer: PPO

## 2018-12-19 DIAGNOSIS — F419 Anxiety disorder, unspecified: Secondary | ICD-10-CM | POA: Diagnosis not present

## 2018-12-19 DIAGNOSIS — E7800 Pure hypercholesterolemia, unspecified: Secondary | ICD-10-CM

## 2018-12-19 DIAGNOSIS — Z9109 Other allergy status, other than to drugs and biological substances: Secondary | ICD-10-CM | POA: Diagnosis not present

## 2018-12-19 DIAGNOSIS — I10 Essential (primary) hypertension: Secondary | ICD-10-CM

## 2018-12-19 DIAGNOSIS — J4520 Mild intermittent asthma, uncomplicated: Secondary | ICD-10-CM | POA: Diagnosis not present

## 2018-12-19 DIAGNOSIS — K219 Gastro-esophageal reflux disease without esophagitis: Secondary | ICD-10-CM

## 2018-12-19 DIAGNOSIS — D649 Anemia, unspecified: Secondary | ICD-10-CM | POA: Diagnosis not present

## 2018-12-19 DIAGNOSIS — R059 Cough, unspecified: Secondary | ICD-10-CM

## 2018-12-19 DIAGNOSIS — R05 Cough: Secondary | ICD-10-CM

## 2018-12-19 NOTE — Progress Notes (Signed)
Patient ID: Patricia Pacheco, female   DOB: 21-Dec-1940, 78 y.o.   MRN: [id number]   Virtual Visit via telephone Note  This visit type was conducted due to national recommendations for restrictions regarding the COVID-19 pandemic (e.g. social distancing).  This format is felt to be most appropriate for this patient at this time.  All issues noted in this document were discussed and addressed.  No physical exam was performed (except for noted visual exam findings with Video Visits).   I connected with Patricia Pacheco by telephone and verified that I am speaking with the correct person using two identifiers. Location patient: home Location provider: work or home office Persons participating in the telephone visit: patient, provider  I discussed the limitations, risks, security and privacy concerns of performing an evaluation and management service by telephone and the availability of in person appointments. The patient expressed understanding and agreed to proceed.   Reason for visit: scheduled follow up  HPI: She reports she is doing relatively well.  She has stopped hctz.  States blood pressure has been doing well and she does not need to be on the medication.  Blood pressures averaging 126-131/60-80.  No chest pain.  No sob.  Is having persistent problems with increased drainage.  Some choking at times.  States will intermittently move into her chest.  Has seen ENT recently.  Prescribed levaquin, prednisone and proair.  She is using proair prn.  Did not take levaquin or prednisone.  Off singulair.  States did not tolerate.  Has tried nasal sprays, antihistamines, singulair and inhalers.  Persistent problems.  Request referral back to ENT.  Due colonoscopy.  They have contacted her.  Discussed with her today.  She declines to schedule right now.     ROS: See pertinent positives and negatives per HPI.  Past Medical History:  Diagnosis Date  . Allergy    allergy shots (Juengel)  . Arthritis   . Asthma    . CAD (coronary artery disease)    cath 11/27/04 - moderated LAD disease  . Cancer ([place])    skin  . GERD (gastroesophageal reflux disease)    EGD (05/25/04) - slight presbyesophagus, mild esophagitis, mild gastritis, mild duodenitis - positive H. pylori.   Marland Kitchen Neuropathy     Past Surgical History:  Procedure Laterality Date  . APPENDECTOMY  1967    Family History  Problem Relation Age of Onset  . Arthritis Mother   . Colon polyps Sister   . Breast cancer Neg Hx     SOCIAL HX: reviewed.    Current Outpatient Medications:  .  ALPRAZolam (XANAX) 0.25 MG tablet, TAKE ONE TABLET EVERY DAY AS NEEDED FOR ANXIETY, Disp: 30 tablet, Rfl: 0 .  antiseptic oral rinse (BIOTENE) LIQD, 15 mLs by Mouth Rinse route as needed for dry mouth., Disp: , Rfl:  .  aspirin 81 MG tablet, Take 81 mg by mouth daily., Disp: , Rfl:  .  Calcium Carbonate-Vit D-Min (CALCIUM 1200 PO), Take by mouth daily., Disp: , Rfl:  .  celecoxib (CELEBREX) 200 MG capsule, TAKE 1 CAPSULE BY MOUTH DAILY, Disp: 30 capsule, Rfl: 2 .  COD LIVER OIL PO, Take by mouth., Disp: , Rfl:  .  Coenzyme Q10 (CO Q-10) 100 MG CAPS, Take 1 capsule by mouth daily., Disp: , Rfl:  .  docusate sodium (COLACE) 100 MG capsule, Take by mouth., Disp: , Rfl:  .  ezetimibe (ZETIA) 10 MG tablet, Take 1 tablet (10 mg total) by mouth  daily., Disp: 30 tablet, Rfl: 2 .  fexofenadine (ALLEGRA) 180 MG tablet, Take 1 tablet (180 mg total) by mouth daily as needed for allergies or rhinitis., Disp: 30 tablet, Rfl: 0 .  fluocinolone (SYNALAR) 0.01 % external solution, Apply topically 2 (two) times daily., Disp: , Rfl:  .  FLUoxetine (PROZAC) 10 MG capsule, TAKE 1 CAPSULE BY MOUTH EVERY DAY, Disp: 30 capsule, Rfl: 2 .  hydrochlorothiazide (MICROZIDE) 12.5 MG capsule, Take 1 capsule (12.5 mg total) by mouth daily., Disp: 30 capsule, Rfl: 2 .  mometasone (ELOCON) 0.1 % lotion, Apply 1 application topically as directed., Disp: , Rfl:  .  montelukast (SINGULAIR) 10 MG  tablet, Take 10 mg by mouth at bedtime., Disp: , Rfl:  .  Multiple Vitamin (MULTIVITAMIN) tablet, Take 1 tablet by mouth daily., Disp: , Rfl:  .  mupirocin ointment (BACTROBAN) 2 %, Place 1 application into the nose 2 (two) times daily., Disp: 22 g, Rfl: 0 .  Omega-3 Fatty Acids (FISH OIL PO), Take by mouth daily., Disp: , Rfl:  .  omeprazole (PRILOSEC) 40 MG capsule, TAKE 1 CAPSULE BY MOUTH ONCE DAILY, Disp: 30 capsule, Rfl: 5 .  PROAIR HFA 108 (90 Base) MCG/ACT inhaler, , Disp: , Rfl:  .  silver sulfADIAZINE (SILVADENE) 1 % cream, Apply 1 application topically daily., Disp: 50 g, Rfl: 0 .  triamcinolone cream (KENALOG) 0.1 %, , Disp: , Rfl:  .  UNABLE TO FIND, , Disp: , Rfl:   EXAM:  VITALS per patient if applicable: [phone]  GENERAL: alert. Sounds to be in no acute distress.  Answering questions appropriately.    PSYCH/NEURO: pleasant and cooperative, no obvious depression or anxiety, speech and thought processing grossly intact  ASSESSMENT AND PLAN:  Discussed the following assessment and plan:  Anxiety On prozac.  Stable.    Asthma Breathing stable.  Off singulair.  States did not tolerate.  Intermittent cough.  Persistent congestion and drainage.  Has tried multiple medications.  Using inhalers prn.  Request referral back to ENT.    Cough Persistent issues with cough/congestion as outlined.  Has tried multiple treatments.  Request referral to ENT.    Environmental allergies Persistent symptoms as outlined.  Has tried antihistamines, nasal sprays, inhalers and singulair. Request referral back to ENT.    Essential hypertension On no blood pressure medication now.  Blood pressures as outlined.  Doing well off medication.  Follow.    GERD (gastroesophageal reflux disease) On prilosec.  Controlled.    Hypercholesterolemia Declines cholesterol medication.  Low cholesterol diet and exercise.  Follow lipid panel.      I discussed the assessment and treatment plan with  the patient. The patient was provided an opportunity to ask questions and all were answered. The patient agreed with the plan and demonstrated an understanding of the instructions.   The patient was advised to call back or seek an in-person evaluation if the symptoms worsen or if the condition fails to improve as anticipated.  I provided 25 minutes of non-face-to-face time during this encounter.   Einar Pheasant, MD

## 2018-12-21 ENCOUNTER — Encounter: Payer: Self-pay | Admitting: Internal Medicine

## 2018-12-21 NOTE — Assessment & Plan Note (Signed)
Persistent issues with cough/congestion as outlined.  Has tried multiple treatments.  Request referral to ENT.

## 2018-12-21 NOTE — Assessment & Plan Note (Signed)
Declines cholesterol medication.  Low cholesterol diet and exercise.  Follow lipid panel.   

## 2018-12-21 NOTE — Assessment & Plan Note (Signed)
On prilosec.  Controlled.   

## 2018-12-21 NOTE — Assessment & Plan Note (Signed)
Breathing stable.  Off singulair.  States did not tolerate.  Intermittent cough.  Persistent congestion and drainage.  Has tried multiple medications.  Using inhalers prn.  Request referral back to ENT.

## 2018-12-21 NOTE — Assessment & Plan Note (Signed)
Persistent symptoms as outlined.  Has tried antihistamines, nasal sprays, inhalers and singulair. Request referral back to ENT.

## 2018-12-21 NOTE — Assessment & Plan Note (Signed)
On prozac.  Stable.   

## 2018-12-21 NOTE — Assessment & Plan Note (Signed)
On no blood pressure medication now.  Blood pressures as outlined.  Doing well off medication.  Follow.

## 2018-12-25 DIAGNOSIS — J301 Allergic rhinitis due to pollen: Secondary | ICD-10-CM | POA: Diagnosis not present

## 2018-12-25 DIAGNOSIS — R05 Cough: Secondary | ICD-10-CM | POA: Diagnosis not present

## 2019-01-13 ENCOUNTER — Other Ambulatory Visit: Payer: Self-pay | Admitting: Internal Medicine

## 2019-01-20 ENCOUNTER — Other Ambulatory Visit: Payer: Self-pay | Admitting: Podiatry

## 2019-01-20 ENCOUNTER — Other Ambulatory Visit: Payer: Self-pay | Admitting: Internal Medicine

## 2019-01-22 ENCOUNTER — Other Ambulatory Visit: Payer: Self-pay

## 2019-03-11 DIAGNOSIS — Z872 Personal history of diseases of the skin and subcutaneous tissue: Secondary | ICD-10-CM | POA: Diagnosis not present

## 2019-03-11 DIAGNOSIS — L578 Other skin changes due to chronic exposure to nonionizing radiation: Secondary | ICD-10-CM | POA: Diagnosis not present

## 2019-03-11 DIAGNOSIS — Z859 Personal history of malignant neoplasm, unspecified: Secondary | ICD-10-CM | POA: Diagnosis not present

## 2019-03-11 DIAGNOSIS — L570 Actinic keratosis: Secondary | ICD-10-CM | POA: Diagnosis not present

## 2019-03-11 DIAGNOSIS — L821 Other seborrheic keratosis: Secondary | ICD-10-CM | POA: Diagnosis not present

## 2019-03-11 DIAGNOSIS — Z85828 Personal history of other malignant neoplasm of skin: Secondary | ICD-10-CM | POA: Diagnosis not present

## 2019-04-20 ENCOUNTER — Other Ambulatory Visit: Payer: Self-pay | Admitting: Internal Medicine

## 2019-04-24 ENCOUNTER — Ambulatory Visit (INDEPENDENT_AMBULATORY_CARE_PROVIDER_SITE_OTHER): Payer: PPO | Admitting: Family Medicine

## 2019-04-24 ENCOUNTER — Encounter: Payer: Self-pay | Admitting: Family Medicine

## 2019-04-24 ENCOUNTER — Other Ambulatory Visit: Payer: Self-pay

## 2019-04-24 VITALS — BP 150/80 | HR 75 | Temp 97.6°F | Ht 59.0 in | Wt 131.6 lb

## 2019-04-24 DIAGNOSIS — B029 Zoster without complications: Secondary | ICD-10-CM | POA: Diagnosis not present

## 2019-04-24 MED ORDER — LIDOCAINE 5 % EX OINT: 1.0000 "application " | TOPICAL_OINTMENT | CUTANEOUS | 0 refills | Status: DC | PRN

## 2019-04-24 MED ORDER — VALACYCLOVIR HCL 1 G PO TABS: 1000.0000 mg | ORAL_TABLET | Freq: Three times a day (TID) | ORAL | 0 refills | Status: DC

## 2019-04-24 NOTE — Progress Notes (Signed)
Subjective:    Patient ID: Patricia Pacheco, female    DOB: 01-16-1941, 78 y.o.   MRN: [id number]  HPI  Patient presents to clinic complaining of rash on upper back that is itchy and painful.  First noticed it 2 days ago; was thinking maybe she just got bit by a bug, but then the rash became more itchy and more painful and became concerned for shingles.   Patient Active Problem List   Diagnosis Date Noted  . Shoulder pain, left 07/04/2017  . Chest pain 02/09/2017  . Asthma 02/09/2017  . Cough 09/30/2016  . Rash 04/03/2015  . Pre-op evaluation 12/26/2014  . Uterovaginal prolapse 12/21/2014  . Nasal lesion 12/13/2014  . Essential hypertension 12/06/2014  . Headache 11/02/2014  . Anxiety 11/02/2014  . Hearing loss 11/02/2014  . Abdominal pain 11/02/2014  . Health care maintenance 11/02/2014  . Sinusitis 05/31/2014  . Fatigue 01/03/2014  . Malaise 01/03/2014  . Skin lesions 11/04/2013  . Disorder of skin and subcutaneous tissue 11/04/2013  . Diverticulosis 10/15/2013  . Diverticulosis of colon 10/15/2013  . Elevated blood pressure 11/16/2012  . Osteopenia 10/10/2012  . Disorder of bone and cartilage 10/10/2012  . Hypercholesterolemia 08/30/2012  . Environmental allergies 08/30/2012  . GERD (gastroesophageal reflux disease) 08/30/2012  . Other nonmedicinal substance allergy status 08/30/2012   Social History   Tobacco Use  . Smoking status: Never Smoker  . Smokeless tobacco: Never Used  Substance Use Topics  . Alcohol use: No    Alcohol/week: 0.0 standard drinks    Review of Systems  Constitutional: Negative for chills, fatigue and fever.  HENT: Negative for congestion, ear pain, sinus pain and sore throat.   Eyes: Negative.   Respiratory: Negative for cough, shortness of breath and wheezing.   Cardiovascular: Negative for chest pain, palpitations and leg swelling.  Gastrointestinal: Negative for abdominal pain, diarrhea, nausea and vomiting.  Genitourinary:  Negative for dysuria, frequency and urgency.  Musculoskeletal: Negative for arthralgias and myalgias.  Skin: +rash on right upper back  Neurological: Negative for syncope, light-headedness and headaches.  Psychiatric/Behavioral: The patient is not nervous/anxious.       Objective:   Physical Exam Vitals signs and nursing note reviewed.  Constitutional:      General: She is not in acute distress.    Appearance: She is not toxic-appearing.  HENT:     Head: Normocephalic and atraumatic.  Cardiovascular:     Rate and Rhythm: Normal rate and regular rhythm.  Pulmonary:     Effort: Pulmonary effort is normal. No respiratory distress.     Breath sounds: Normal breath sounds.  Skin:    Findings: Rash present.          Comments: Small rash of her back indicated by red mark on diagram.  Rash does appear red, slightly raised and as if a small blister may be forming.  Does look consistent with a early onset shingles rash.  Neurological:     Mental Status: She is alert and oriented to person, place, and time.  Psychiatric:        Mood and Affect: Mood normal.        Behavior: Behavior normal.    Vitals:   04/24/19 1522  BP: (!) 150/80  Pulse: 75  Temp: 97.6 F (36.4 C)  SpO2: 97%       Assessment & Plan:    Shingles-she will take Valtrex 3 times daily for 7 days.  She will use lidocaine topical ointment  as needed for pain relief.  Advised she can use Tylenol also for pain relief.  Advised to avoid scratching at area.  Patient given informational handout outlining general care information in regards to shingles.  Advised that the rash may get worse before it gets better but hopefully we are catching it early in the Valtrex will be able to offset it worsening.  Patient will keep all regular follow-ups as planned and return clinic sooner if any issues arise.

## 2019-04-24 NOTE — Patient Instructions (Signed)
Shingles  Shingles is an infection. It gives you a painful skin rash and blisters that have fluid in them. Shingles is caused by the same germ (virus) that causes chickenpox. Shingles only happens in people who:  Have had chickenpox.  Have been given a shot of medicine (vaccine) to protect against chickenpox. Shingles is rare in this group. The first symptoms of shingles may be itching, tingling, or pain in an area on your skin. A rash will show on your skin a few days or weeks later. The rash is likely to be on one side of your body. The rash usually has a shape like a belt or a band. Over time, the rash turns into fluid-filled blisters. The blisters will break open, change into scabs, and dry up. Medicines may:  Help with pain and itching.  Help you get better sooner.  Help to prevent long-term problems. Follow these instructions at home: Medicines  Take over-the-counter and prescription medicines only as told by your doctor.  Put on an anti-itch cream or numbing cream where you have a rash, blisters, or scabs. Do this as told by your doctor. Helping with itching and discomfort   Put cold, wet cloths (cold compresses) on the area of the rash or blisters as told by your doctor.  Cool baths can help you feel better. Try adding baking soda or dry oatmeal to the water to lessen itching. Do not bathe in hot water. Blister and rash care  Keep your rash covered with a loose bandage (dressing).  Wear loose clothing that does not rub on your rash.  Keep your rash and blisters clean. To do this, wash the area with mild soap and cool water as told by your doctor.  Check your rash every day for signs of infection. Check for: ? More redness, swelling, or pain. ? Fluid or blood. ? Warmth. ? Pus or a bad smell.  Do not scratch your rash. Do not pick at your blisters. To help you to not scratch: ? Keep your fingernails clean and cut short. ? Wear gloves or mittens when you sleep, if  scratching is a problem. General instructions  Rest as told by your doctor.  Keep all follow-up visits as told by your doctor. This is important.  Wash your hands often with soap and water. If soap and water are not available, use hand sanitizer. Doing this lowers your chance of getting a skin infection caused by germs (bacteria).  Your infection can cause chickenpox in people who have never had chickenpox or never got a shot of chickenpox vaccine. If you have blisters that did not change into scabs yet, try not to touch other people or be around other people, especially: ? Babies. ? Pregnant women. ? Children who have areas of red, itchy, or rough skin (eczema). ? Very old people who have transplants. ? People who have a long-term (chronic) sickness, like cancer or AIDS. Contact a doctor if:  Your pain does not get better with medicine.  Your pain does not get better after the rash heals.  You have any signs of infection in the rash area. These signs include: ? More redness, swelling, or pain around the rash. ? Fluid or blood coming from the rash. ? The rash area feeling warm to the touch. ? Pus or a bad smell coming from the rash. Get help right away if:  The rash is on your face or nose.  You have pain in your face or pain by   your eye.  You lose feeling on one side of your face.  You have trouble seeing.  You have ear pain, or you have ringing in your ear.  You have a loss of taste.  Your condition gets worse. Summary  Shingles gives you a painful skin rash and blisters that have fluid in them.  Shingles is an infection. It is caused by the same germ (virus) that causes chickenpox.  Keep your rash covered with a loose bandage (dressing). Wear loose clothing that does not rub on your rash.  If you have blisters that did not change into scabs yet, try not to touch other people or be around people. This information is not intended to replace advice given to you by  your health care provider. Make sure you discuss any questions you have with your health care provider. Document Released: 11/28/2007 Document Revised: 10/03/2018 Document Reviewed: 02/13/2017 Elsevier Patient Education  2020 [employer]  

## 2019-05-25 ENCOUNTER — Other Ambulatory Visit: Payer: Self-pay | Admitting: Podiatry

## 2019-07-15 ENCOUNTER — Other Ambulatory Visit: Payer: Self-pay

## 2019-07-15 ENCOUNTER — Ambulatory Visit (INDEPENDENT_AMBULATORY_CARE_PROVIDER_SITE_OTHER): Payer: PPO | Admitting: Podiatry

## 2019-07-15 ENCOUNTER — Encounter: Payer: Self-pay | Admitting: Podiatry

## 2019-07-15 DIAGNOSIS — M722 Plantar fascial fibromatosis: Secondary | ICD-10-CM | POA: Diagnosis not present

## 2019-07-15 MED ORDER — CELECOXIB 200 MG PO CAPS: 200.0000 mg | ORAL_CAPSULE | Freq: Every day | ORAL | 3 refills | Status: DC

## 2019-07-15 NOTE — Progress Notes (Signed)
She presents today for follow-up of her foot pain.  Objective: Vital signs stable alert and oriented x3.  Pulses are palpable.  Still has pain across the midfoot and plantar medial arch pulses are palpable no open lesions or wounds.  Assessment: Osteoarthritis plantar fasciitis bilateral.  Plan: Refill her Celebrex today 200 mg 1 p.o. daily I also reminded her that she was due for blood work from her primary care provider which is visible in epic.

## 2019-07-31 ENCOUNTER — Other Ambulatory Visit: Payer: Self-pay

## 2019-07-31 ENCOUNTER — Other Ambulatory Visit (INDEPENDENT_AMBULATORY_CARE_PROVIDER_SITE_OTHER): Payer: PPO

## 2019-07-31 DIAGNOSIS — D649 Anemia, unspecified: Secondary | ICD-10-CM

## 2019-07-31 DIAGNOSIS — I10 Essential (primary) hypertension: Secondary | ICD-10-CM

## 2019-07-31 DIAGNOSIS — E7800 Pure hypercholesterolemia, unspecified: Secondary | ICD-10-CM

## 2019-07-31 LAB — LIPID PANEL
Cholesterol: 192 mg/dL (ref 0–200)
HDL: 59.3 mg/dL (ref >39.00)
LDL Cholesterol: 116 mg/dL — ABNORMAL HIGH (ref 0–99)
NonHDL: 133.12
Total CHOL/HDL Ratio: 3
Triglycerides: 88 mg/dL (ref 0.0–149.0)
VLDL: 17.6 mg/dL (ref 0.0–40.0)

## 2019-07-31 LAB — BASIC METABOLIC PANEL
BUN: 18 mg/dL (ref 6–23)
CO2: 32 mEq/L (ref 19–32)
Calcium: 9.1 mg/dL (ref 8.4–10.5)
Chloride: 104 mEq/L (ref 96–112)
Creatinine, Ser: 0.9 mg/dL (ref 0.40–1.20)
GFR: 60.26 mL/min (ref >60.00)
Glucose, Bld: 87 mg/dL (ref 70–99)
Potassium: 3.7 mEq/L (ref 3.5–5.1)
Sodium: 141 mEq/L (ref 135–145)

## 2019-07-31 LAB — HEPATIC FUNCTION PANEL
ALT: 11 U/L (ref 0–35)
AST: 16 U/L (ref 0–37)
Albumin: 4.1 g/dL (ref 3.5–5.2)
Alkaline Phosphatase: 83 U/L (ref 39–117)
Bilirubin, Direct: 0.1 mg/dL (ref 0.0–0.3)
Total Bilirubin: 0.4 mg/dL (ref 0.2–1.2)
Total Protein: 6.7 g/dL (ref 6.0–8.3)

## 2019-07-31 LAB — FERRITIN: Ferritin: 35.3 ng/mL (ref 10.0–291.0)

## 2019-07-31 LAB — CBC WITH DIFFERENTIAL/PLATELET
Basophils Absolute: 0 10*3/uL (ref 0.0–0.1)
Basophils Relative: 0.5 % (ref 0.0–3.0)
Eosinophils Absolute: 0.2 10*3/uL (ref 0.0–0.7)
Eosinophils Relative: 3.7 % (ref 0.0–5.0)
HCT: 35.4 % — ABNORMAL LOW (ref 36.0–46.0)
Hemoglobin: 11.7 g/dL — ABNORMAL LOW (ref 12.0–15.0)
Lymphocytes Relative: 43.1 % (ref 12.0–46.0)
Lymphs Abs: 2.3 10*3/uL (ref 0.7–4.0)
MCHC: 33.2 g/dL (ref 30.0–36.0)
MCV: 91.6 fl (ref 78.0–100.0)
Monocytes Absolute: 0.3 10*3/uL (ref 0.1–1.0)
Monocytes Relative: 6.3 % (ref 3.0–12.0)
Neutro Abs: 2.5 10*3/uL (ref 1.4–7.7)
Neutrophils Relative %: 46.4 % (ref 43.0–77.0)
Platelets: 179 10*3/uL (ref 150.0–400.0)
RBC: 3.86 Mil/uL — ABNORMAL LOW (ref 3.87–5.11)
RDW: 13.9 % (ref 11.5–15.5)
WBC: 5.4 10*3/uL (ref 4.0–10.5)

## 2019-07-31 LAB — VITAMIN B12: Vitamin B-12: 525 pg/mL (ref 211–911)

## 2019-07-31 LAB — TSH: TSH: 2.49 u[IU]/mL (ref 0.35–4.50)

## 2019-08-03 ENCOUNTER — Other Ambulatory Visit: Payer: Self-pay | Admitting: Internal Medicine

## 2019-08-03 DIAGNOSIS — D649 Anemia, unspecified: Secondary | ICD-10-CM

## 2019-08-03 NOTE — Progress Notes (Signed)
Order placed for f/u cbc and iron studies.

## 2019-08-11 ENCOUNTER — Other Ambulatory Visit: Payer: Self-pay | Admitting: Internal Medicine

## 2019-08-11 DIAGNOSIS — Z1231 Encounter for screening mammogram for malignant neoplasm of breast: Secondary | ICD-10-CM

## 2019-08-12 ENCOUNTER — Other Ambulatory Visit: Payer: Self-pay | Admitting: Internal Medicine

## 2019-08-21 ENCOUNTER — Telehealth (INDEPENDENT_AMBULATORY_CARE_PROVIDER_SITE_OTHER): Payer: PPO | Admitting: Internal Medicine

## 2019-08-21 ENCOUNTER — Other Ambulatory Visit: Payer: Self-pay

## 2019-08-21 ENCOUNTER — Encounter: Payer: Self-pay | Admitting: Internal Medicine

## 2019-08-21 VITALS — BP 119/64 | HR 78 | Ht 61.0 in | Wt 125.0 lb

## 2019-08-21 DIAGNOSIS — I10 Essential (primary) hypertension: Secondary | ICD-10-CM

## 2019-08-21 DIAGNOSIS — Z1211 Encounter for screening for malignant neoplasm of colon: Secondary | ICD-10-CM

## 2019-08-21 DIAGNOSIS — R140 Abdominal distension (gaseous): Secondary | ICD-10-CM | POA: Diagnosis not present

## 2019-08-21 DIAGNOSIS — F419 Anxiety disorder, unspecified: Secondary | ICD-10-CM

## 2019-08-21 DIAGNOSIS — E7800 Pure hypercholesterolemia, unspecified: Secondary | ICD-10-CM

## 2019-08-21 DIAGNOSIS — K219 Gastro-esophageal reflux disease without esophagitis: Secondary | ICD-10-CM

## 2019-08-21 DIAGNOSIS — D649 Anemia, unspecified: Secondary | ICD-10-CM

## 2019-08-21 NOTE — Progress Notes (Signed)
Patient ID: Patricia Pacheco, female   DOB: May 25, 1941, 79 y.o.   MRN: [id number]   Virtual Visit via telephone Note  This visit type was conducted due to national recommendations for restrictions regarding the COVID-19 pandemic (e.g. social distancing).  This format is felt to be most appropriate for this patient at this time.  All issues noted in this document were discussed and addressed.  No physical exam was performed (except for noted visual exam findings with Video Visits).   I connected with Patricia Pacheco by telephone and verified that I am speaking with the correct person using two identifiers. Location patient: home Location provider: work  Persons participating in the telephone visit: patient, provider  I discussed the limitations, risks, security and privacy concerns of performing an evaluation and management service by telephone and the availability of in person appointments. The patient expressed understanding and agreed to proceed.   Reason for visit: scheduled follow up.   HPI: Here for scheduled follow up.  Last visit, she had stopped hctz.  Remains off.  Blood pressures:  119/64 and 124/64.  Remains off medication.  No chest pain or sob.  Has been having some stomach issues.  Has noticed some bloating.  States acid reflux is controlled.  On prilosec.  Abdomen will feel tight at times.  No blood in her stool.  Just increased gas.  Recent labs - decreased hgb.  Discussed the need for colonoscopy.  Handling stress.  On prozac.     ROS: See pertinent positives and negatives per HPI.  Past Medical History:  Diagnosis Date  . Allergy    allergy shots (Juengel)  . Arthritis   . Asthma   . CAD (coronary artery disease)    cath 11/27/04 - moderated LAD disease  . Cancer ([place])    skin  . GERD (gastroesophageal reflux disease)    EGD (05/25/04) - slight presbyesophagus, mild esophagitis, mild gastritis, mild duodenitis - positive H. pylori.   Marland Kitchen Neuropathy     Past Surgical History:   Procedure Laterality Date  . APPENDECTOMY  1967    Family History  Problem Relation Age of Onset  . Arthritis Mother   . Colon polyps Sister   . Breast cancer Neg Hx     SOCIAL HX: reviewed.     Current Outpatient Medications:  .  Probiotic Product (PROBIOTIC PO), Take by mouth., Disp: , Rfl:  .  ALPRAZolam (XANAX) 0.25 MG tablet, TAKE ONE TABLET EVERY DAY AS NEEDED FOR ANXIETY, Disp: 30 tablet, Rfl: 0 .  antiseptic oral rinse (BIOTENE) LIQD, 15 mLs by Mouth Rinse route as needed for dry mouth., Disp: , Rfl:  .  Ascorbic Acid (VITAMIN C PO), Take by mouth daily., Disp: , Rfl:  .  aspirin 81 MG tablet, Take 81 mg by mouth daily., Disp: , Rfl:  .  BIOTIN PO, Take by mouth daily., Disp: , Rfl:  .  Calcium Carbonate-Vit D-Min (CALCIUM 1200 PO), Take by mouth daily., Disp: , Rfl:  .  celecoxib (CELEBREX) 200 MG capsule, Take 1 capsule (200 mg total) by mouth daily., Disp: 90 capsule, Rfl: 3 .  COD LIVER OIL PO, Take by mouth., Disp: , Rfl:  .  Coenzyme Q10 (CO Q-10) 100 MG CAPS, Take 1 capsule by mouth daily., Disp: , Rfl:  .  docusate sodium (COLACE) 100 MG capsule, Take by mouth., Disp: , Rfl:  .  ezetimibe (ZETIA) 10 MG tablet, TAKE ONE TABLET BY MOUTH EVERY DAY (Patient not taking:  Reported on 08/21/2019), Disp: 30 tablet, Rfl: 2 .  fexofenadine (ALLEGRA) 180 MG tablet, Take 1 tablet (180 mg total) by mouth daily as needed for allergies or rhinitis., Disp: 30 tablet, Rfl: 0 .  fluocinolone (SYNALAR) 0.01 % external solution, Apply topically 2 (two) times daily., Disp: , Rfl:  .  FLUoxetine (PROZAC) 10 MG capsule, TAKE 1 CAPSULE EVERY DAY, Disp: 30 capsule, Rfl: 2 .  hydrochlorothiazide (MICROZIDE) 12.5 MG capsule, Take 1 capsule (12.5 mg total) by mouth daily. (Patient not taking: Reported on 08/21/2019), Disp: 30 capsule, Rfl: 2 .  lidocaine (XYLOCAINE) 5 % ointment, Apply 1 application topically as needed., Disp: 35.44 g, Rfl: 0 .  mometasone (ELOCON) 0.1 % lotion, Apply 1  application topically as directed., Disp: , Rfl:  .  montelukast (SINGULAIR) 10 MG tablet, Take 10 mg by mouth at bedtime., Disp: , Rfl:  .  Multiple Vitamin (MULTIVITAMIN) tablet, Take 1 tablet by mouth daily., Disp: , Rfl:  .  mupirocin ointment (BACTROBAN) 2 %, Place 1 application into the nose 2 (two) times daily., Disp: 22 g, Rfl: 0 .  Omega-3 Fatty Acids (FISH OIL PO), Take by mouth daily., Disp: , Rfl:  .  omeprazole (PRILOSEC) 40 MG capsule, TAKE 1 CAPSULE EVERY DAY, Disp: 30 capsule, Rfl: 5 .  PROAIR HFA 108 (90 Base) MCG/ACT inhaler, , Disp: , Rfl:  .  silver sulfADIAZINE (SILVADENE) 1 % cream, Apply 1 application topically daily., Disp: 50 g, Rfl: 0 .  triamcinolone cream (KENALOG) 0.1 %, , Disp: , Rfl:  .  valACYclovir (VALTREX) 1000 MG tablet, Take 1 tablet (1,000 mg total) by mouth 3 (three) times daily. (Patient not taking: Reported on 08/21/2019), Disp: 21 tablet, Rfl: 0 .  VITAMIN D PO, Take by mouth daily., Disp: , Rfl:   EXAM:  VITALS per patient if applicable:119/64  GENERAL: alert.  Sounds to be in no acute distress.  Answering questions appropriately.   PSYCH/NEURO: pleasant and cooperative, no obvious depression or anxiety, speech and thought processing grossly intact  ASSESSMENT AND PLAN:  Discussed the following assessment and plan:  Anxiety On prozac.  Overall feels she is handling things relatively well.  Follow.    Essential hypertension Blood pressures as outlined.  She is on no medication now.  Follow pressures.    GERD (gastroesophageal reflux disease) Acid reflux controlled on prilosec.  Follow.    Hypercholesterolemia Has declined cholesterol medication.  Low cholesterol diet and exercise.  Follow lipid panel.   Abdominal bloating Abdominal bloating and increased gas as outlined.  Acid reflux controlled.  Bowels are moving.  Discussed further w/up.  Discussed CT scan.  Wants to hold on CT scan.  Discussed due colonoscopy.  Agreed for referral.   Refer to GI for further evaluation and colonoscopy.     Orders Placed This Encounter  Procedures  . CBC with Differential/Platelet    Standing Status:   Future    Standing Expiration Date:   08/29/2020  . Hepatic function panel    Standing Status:   Future    Standing Expiration Date:   08/29/2020  . Lipid panel    Standing Status:   Future    Standing Expiration Date:   08/29/2020  . Basic metabolic panel    Standing Status:   Future    Standing Expiration Date:   08/29/2020  . IBC + Ferritin    Standing Status:   Future    Standing Expiration Date:   08/29/2020  . Ambulatory  referral to Gastroenterology    Referral Priority:   Routine    Referral Type:   Consultation    Referral Reason:   Specialty Services Required    Number of Visits Requested:   1     I discussed the assessment and treatment plan with the patient. The patient was provided an opportunity to ask questions and all were answered. The patient agreed with the plan and demonstrated an understanding of the instructions.   The patient was advised to call back or seek an in-person evaluation if the symptoms worsen or if the condition fails to improve as anticipated.  I provided 23 minutes of non-face-to-face time during this encounter.   Einar Pheasant, MD

## 2019-08-30 ENCOUNTER — Encounter: Payer: Self-pay | Admitting: Internal Medicine

## 2019-08-30 DIAGNOSIS — R140 Abdominal distension (gaseous): Secondary | ICD-10-CM | POA: Insufficient documentation

## 2019-08-30 NOTE — Assessment & Plan Note (Signed)
Blood pressures as outlined.  She is on no medication now.  Follow pressures.

## 2019-08-30 NOTE — Assessment & Plan Note (Signed)
Abdominal bloating and increased gas as outlined.  Acid reflux controlled.  Bowels are moving.  Discussed further w/up.  Discussed CT scan.  Wants to hold on CT scan.  Discussed due colonoscopy.  Agreed for referral.  Refer to GI for further evaluation and colonoscopy.

## 2019-08-30 NOTE — Assessment & Plan Note (Signed)
On prozac.  Overall feels she is handling things relatively well.  Follow.

## 2019-08-30 NOTE — Assessment & Plan Note (Signed)
Has declined cholesterol medication.  Low cholesterol diet and exercise.  Follow lipid panel.   

## 2019-08-30 NOTE — Assessment & Plan Note (Signed)
Acid reflux controlled on prilosec.  Follow.  

## 2019-08-31 ENCOUNTER — Other Ambulatory Visit: Payer: Self-pay

## 2019-08-31 ENCOUNTER — Other Ambulatory Visit (INDEPENDENT_AMBULATORY_CARE_PROVIDER_SITE_OTHER): Payer: PPO

## 2019-08-31 DIAGNOSIS — E7800 Pure hypercholesterolemia, unspecified: Secondary | ICD-10-CM | POA: Diagnosis not present

## 2019-08-31 DIAGNOSIS — D649 Anemia, unspecified: Secondary | ICD-10-CM | POA: Diagnosis not present

## 2019-08-31 DIAGNOSIS — I10 Essential (primary) hypertension: Secondary | ICD-10-CM | POA: Diagnosis not present

## 2019-09-01 LAB — CBC WITH DIFFERENTIAL/PLATELET
Basophils Absolute: 0 10*3/uL (ref 0.0–0.1)
Basophils Relative: 0.9 % (ref 0.0–3.0)
Eosinophils Absolute: 0.3 10*3/uL (ref 0.0–0.7)
Eosinophils Relative: 4.9 % (ref 0.0–5.0)
HCT: 34.7 % — ABNORMAL LOW (ref 36.0–46.0)
Hemoglobin: 11.6 g/dL — ABNORMAL LOW (ref 12.0–15.0)
Lymphocytes Relative: 42.3 % (ref 12.0–46.0)
Lymphs Abs: 2.2 10*3/uL (ref 0.7–4.0)
MCHC: 33.4 g/dL (ref 30.0–36.0)
MCV: 91.1 fl (ref 78.0–100.0)
Monocytes Absolute: 0.4 10*3/uL (ref 0.1–1.0)
Monocytes Relative: 6.9 % (ref 3.0–12.0)
Neutro Abs: 2.3 10*3/uL (ref 1.4–7.7)
Neutrophils Relative %: 45 % (ref 43.0–77.0)
Platelets: 202 10*3/uL (ref 150.0–400.0)
RBC: 3.81 Mil/uL — ABNORMAL LOW (ref 3.87–5.11)
RDW: 13.4 % (ref 11.5–15.5)
WBC: 5.2 10*3/uL (ref 4.0–10.5)

## 2019-09-01 LAB — BASIC METABOLIC PANEL
BUN: 18 mg/dL (ref 6–23)
CO2: 30 mEq/L (ref 19–32)
Calcium: 9 mg/dL (ref 8.4–10.5)
Chloride: 102 mEq/L (ref 96–112)
Creatinine, Ser: 0.92 mg/dL (ref 0.40–1.20)
GFR: 58.73 mL/min — ABNORMAL LOW (ref >60.00)
Glucose, Bld: 117 mg/dL — ABNORMAL HIGH (ref 70–99)
Potassium: 3.8 mEq/L (ref 3.5–5.1)
Sodium: 139 mEq/L (ref 135–145)

## 2019-09-01 LAB — HEPATIC FUNCTION PANEL
ALT: 12 U/L (ref 0–35)
AST: 17 U/L (ref 0–37)
Albumin: 4 g/dL (ref 3.5–5.2)
Alkaline Phosphatase: 82 U/L (ref 39–117)
Bilirubin, Direct: 0.1 mg/dL (ref 0.0–0.3)
Total Bilirubin: 0.3 mg/dL (ref 0.2–1.2)
Total Protein: 6.4 g/dL (ref 6.0–8.3)

## 2019-09-01 LAB — LIPID PANEL
Cholesterol: 181 mg/dL (ref 0–200)
HDL: 51.4 mg/dL (ref >39.00)
LDL Cholesterol: 93 mg/dL (ref 0–99)
NonHDL: 129.13
Total CHOL/HDL Ratio: 4
Triglycerides: 179 mg/dL — ABNORMAL HIGH (ref 0.0–149.0)
VLDL: 35.8 mg/dL (ref 0.0–40.0)

## 2019-09-01 LAB — IBC + FERRITIN
Ferritin: 31.4 ng/mL (ref 10.0–291.0)
Iron: 81 ug/dL (ref 42–145)
Saturation Ratios: 27.4 % (ref 20.0–50.0)
Transferrin: 211 mg/dL — ABNORMAL LOW (ref 212.0–360.0)

## 2019-09-03 ENCOUNTER — Other Ambulatory Visit: Payer: Self-pay | Admitting: Internal Medicine

## 2019-09-03 MED ORDER — ALPRAZOLAM 0.25 MG PO TABS: ORAL_TABLET | ORAL | 0 refills | Status: DC

## 2019-09-03 NOTE — Progress Notes (Signed)
rx ok'd for xanax #30 with no refills.   

## 2019-09-28 ENCOUNTER — Ambulatory Visit
Admission: RE | Admit: 2019-09-28 | Discharge: 2019-09-28 | Disposition: A | Discharge status: Home / Self Care | Payer: PPO | Source: Ambulatory Visit | Attending: Internal Medicine | Admitting: Internal Medicine

## 2019-09-28 DIAGNOSIS — Z1231 Encounter for screening mammogram for malignant neoplasm of breast: Secondary | ICD-10-CM

## 2019-10-27 ENCOUNTER — Other Ambulatory Visit: Payer: Self-pay

## 2019-10-27 ENCOUNTER — Ambulatory Visit (INDEPENDENT_AMBULATORY_CARE_PROVIDER_SITE_OTHER): Payer: PPO | Admitting: Gastroenterology

## 2019-10-27 ENCOUNTER — Encounter: Payer: Self-pay | Admitting: Gastroenterology

## 2019-10-27 VITALS — BP 165/89 | HR 79 | Temp 98.9°F | Wt 134.5 lb

## 2019-10-27 DIAGNOSIS — D509 Iron deficiency anemia, unspecified: Secondary | ICD-10-CM

## 2019-10-27 DIAGNOSIS — R140 Abdominal distension (gaseous): Secondary | ICD-10-CM | POA: Diagnosis not present

## 2019-10-27 DIAGNOSIS — K5909 Other constipation: Secondary | ICD-10-CM | POA: Diagnosis not present

## 2019-10-27 MED ORDER — NA SULFATE-K SULFATE-MG SULF 17.5-3.13-1.6 GM/177ML PO SOLN: 354.0000 mL | Freq: Once | ORAL | 0 refills | Status: AC

## 2019-10-27 NOTE — Patient Instructions (Signed)

## 2019-10-27 NOTE — Progress Notes (Signed)
Cephas Darby, MD [address]  [place]  [place], [place] [address]  Main: [phone]  Fax: [phone]    Gastroenterology Consultation  Referring Provider:     Einar Pheasant, MD Primary Care Physician:  Einar Pheasant, MD Primary Gastroenterologist:  Dr. Cephas Darby Reason for Consultation:     Abdominal bloating, iron deficiency anemia        HPI:   Patricia Pacheco is a 79 y.o. female referred by Dr. Einar Pheasant, MD  for consultation & management of abdominal bloating and iron deficiency anemia.  Patient reports she has history of chronic constipation as well as abdominal bloating.  Patient reports that she has abdominal bloating and lower abdominal cramps and she thinks these are secondary to stress which she has been going through lately.  She is also found to have iron deficiency anemia, most recent hemoglobin 11.6, MCV normal, her ferritin levels have been less than 50 since 2016.  She is not on any oral iron supplements.  Her weight has been stable, she denies abdominal pain, nausea, vomiting, rectal bleeding, melena She does not smoke or drink alcohol Apparently, patient is on a lot of nutrition/dietary supplements  Patient also has history of reflux for which she takes omeprazole 40 mg daily  NSAIDs: None  Antiplts/Anticoagulants/Anti thrombotics: None  GI Procedures: Patient underwent colonoscopy in 2015 which was normal  Past Medical History:  Diagnosis Date  . Allergy    allergy shots (Juengel)  . Arthritis   . Asthma   . CAD (coronary artery disease)    cath 11/27/04 - moderated LAD disease  . Cancer ([place])    skin  . GERD (gastroesophageal reflux disease)    EGD (05/25/04) - slight presbyesophagus, mild esophagitis, mild gastritis, mild duodenitis - positive H. pylori.   Marland Kitchen Neuropathy     Past Surgical History:  Procedure Laterality Date  . APPENDECTOMY  1967    Current Outpatient Medications:  .  ALPRAZolam (XANAX) 0.25 MG tablet,  TAKE ONE TABLET EVERY DAY AS NEEDED FOR ANXIETY, Disp: 30 tablet, Rfl: 0 .  antiseptic oral rinse (BIOTENE) LIQD, 15 mLs by Mouth Rinse route as needed for dry mouth., Disp: , Rfl:  .  Ascorbic Acid (VITAMIN C PO), Take by mouth daily., Disp: , Rfl:  .  aspirin 81 MG tablet, Take 81 mg by mouth daily., Disp: , Rfl:  .  BIOTIN PO, Take by mouth daily., Disp: , Rfl:  .  Calcium Carbonate-Vit D-Min (CALCIUM 1200 PO), Take by mouth daily., Disp: , Rfl:  .  celecoxib (CELEBREX) 200 MG capsule, Take 1 capsule (200 mg total) by mouth daily., Disp: 90 capsule, Rfl: 3 .  COD LIVER OIL PO, Take by mouth., Disp: , Rfl:  .  Coenzyme Q10 (CO Q-10) 100 MG CAPS, Take 1 capsule by mouth daily., Disp: , Rfl:  .  docusate sodium (COLACE) 100 MG capsule, Take by mouth., Disp: , Rfl:  .  ezetimibe (ZETIA) 10 MG tablet, TAKE ONE TABLET BY MOUTH EVERY DAY, Disp: 30 tablet, Rfl: 2 .  fexofenadine (ALLEGRA) 180 MG tablet, Take 1 tablet (180 mg total) by mouth daily as needed for allergies or rhinitis., Disp: 30 tablet, Rfl: 0 .  fluocinolone (SYNALAR) 0.01 % external solution, Apply topically 2 (two) times daily., Disp: , Rfl:  .  FLUoxetine (PROZAC) 10 MG capsule, TAKE 1 CAPSULE EVERY DAY, Disp: 30 capsule, Rfl: 2 .  lidocaine (XYLOCAINE) 5 % ointment, Apply 1 application topically  as needed., Disp: 35.44 g, Rfl: 0 .  mometasone (ELOCON) 0.1 % lotion, Apply 1 application topically as directed., Disp: , Rfl:  .  Multiple Vitamin (MULTIVITAMIN) tablet, Take 1 tablet by mouth daily., Disp: , Rfl:  .  mupirocin ointment (BACTROBAN) 2 %, Place 1 application into the nose 2 (two) times daily., Disp: 22 g, Rfl: 0 .  Omega-3 Fatty Acids (FISH OIL PO), Take by mouth daily., Disp: , Rfl:  .  omeprazole (PRILOSEC) 40 MG capsule, TAKE 1 CAPSULE EVERY DAY, Disp: 30 capsule, Rfl: 5 .  PROAIR HFA 108 (90 Base) MCG/ACT inhaler, , Disp: , Rfl:  .  Probiotic Product (PROBIOTIC PO), Take by mouth., Disp: , Rfl:  .  silver sulfADIAZINE  (SILVADENE) 1 % cream, Apply 1 application topically daily., Disp: 50 g, Rfl: 0 .  triamcinolone cream (KENALOG) 0.1 %, , Disp: , Rfl:  .  VITAMIN D PO, Take by mouth daily., Disp: , Rfl:  .  Na Sulfate-K Sulfate-Mg Sulf 17.5-3.13-1.6 GM/177ML SOLN, Take 354 mLs by mouth once for 1 dose., Disp: 354 mL, Rfl: 0   Family History  Problem Relation Age of Onset  . Arthritis Mother   . Colon polyps Sister   . Breast cancer Neg Hx      Social History   Tobacco Use  . Smoking status: Never Smoker  . Smokeless tobacco: Never Used  Substance Use Topics  . Alcohol use: No    Alcohol/week: 0.0 standard drinks  . Drug use: No    Allergies as of 10/27/2019  . (No Known Allergies)    Review of Systems:    All systems reviewed and negative except where noted in HPI.   Physical Exam:  BP (!) 165/89 (BP Location: Left Arm, Patient Position: Sitting, Cuff Size: Normal)   Pulse 79   Temp 98.9 F (37.2 C) (Oral)   Wt 134 lb 8 oz (61 kg)   LMP 08/29/1995   BMI 25.41 kg/m  Patient's last menstrual period was 08/29/1995.  General:   Alert,  Well-developed, well-nourished, pleasant and cooperative in NAD Head:  Normocephalic and atraumatic. Eyes:  Sclera clear, no icterus.   Conjunctiva pink. Ears:  Normal auditory acuity. Nose:  No deformity, discharge, or lesions. Mouth:  No deformity or lesions,oropharynx pink & moist. Neck:  Supple; no masses or thyromegaly. Lungs:  Respirations even and unlabored.  Clear throughout to auscultation.   No wheezes, crackles, or rhonchi. No acute distress. Heart:  Regular rate and rhythm; no murmurs, clicks, rubs, or gallops. Abdomen:  Normal bowel sounds. Soft, non-tender and non-distended without masses, hepatosplenomegaly or hernias noted.  No guarding or rebound tenderness.   Rectal: Not performed Msk:  Symmetrical without gross deformities. Good, equal movement & strength bilaterally. Pulses:  Normal pulses noted. Extremities:  No clubbing or  edema.  No cyanosis. Neurologic:  Alert and oriented x3;  grossly normal neurologically. Skin:  Intact without significant lesions or rashes. No jaundice. Psych:  Alert and cooperative. Normal mood and affect.  Imaging Studies: No abdominal imaging  Assessment and Plan:   Evanne H Vecchio is a 79 y.o. female with coronary artery disease, chronic constipation is seen in consultation for abdominal bloating, chronic iron deficiency anemia  Iron deficiency anemia  Recommend EGD and colonoscopy for further evaluation, if negative, recommend video capsule endoscopy  Chronic constipation and abdominal bloating Discussed about high-fiber diet, information provided Continue stool softener regularly  Follow up in 3 months   Cephas Darby, MD

## 2019-11-12 ENCOUNTER — Other Ambulatory Visit
Admission: RE | Admit: 2019-11-12 | Discharge: 2019-11-12 | Disposition: A | Discharge status: Home / Self Care | Payer: PPO | Source: Ambulatory Visit | Attending: Gastroenterology | Admitting: Gastroenterology

## 2019-11-12 DIAGNOSIS — Z20822 Contact with and (suspected) exposure to covid-19: Secondary | ICD-10-CM | POA: Insufficient documentation

## 2019-11-12 DIAGNOSIS — Z01812 Encounter for preprocedural laboratory examination: Secondary | ICD-10-CM | POA: Insufficient documentation

## 2019-11-13 LAB — SARS CORONAVIRUS 2 (TAT 6-24 HRS): SARS Coronavirus 2: NEGATIVE

## 2019-11-16 ENCOUNTER — Ambulatory Visit
Admission: RE | Admit: 2019-11-16 | Discharge: 2019-11-16 | Disposition: A | Discharge status: Home / Self Care | Payer: PPO | Attending: Gastroenterology | Admitting: Gastroenterology

## 2019-11-16 ENCOUNTER — Ambulatory Visit: Payer: PPO | Admitting: Certified Registered Nurse Anesthetist

## 2019-11-16 ENCOUNTER — Encounter
Admission: RE | Disposition: A | Discharge status: Home / Self Care | Payer: Self-pay | Source: Home / Self Care | Attending: Gastroenterology

## 2019-11-16 ENCOUNTER — Other Ambulatory Visit: Payer: Self-pay

## 2019-11-16 ENCOUNTER — Encounter: Payer: Self-pay | Admitting: Gastroenterology

## 2019-11-16 DIAGNOSIS — K228 Other specified diseases of esophagus: Secondary | ICD-10-CM | POA: Insufficient documentation

## 2019-11-16 DIAGNOSIS — G629 Polyneuropathy, unspecified: Secondary | ICD-10-CM | POA: Diagnosis not present

## 2019-11-16 DIAGNOSIS — K635 Polyp of colon: Secondary | ICD-10-CM

## 2019-11-16 DIAGNOSIS — M1990 Unspecified osteoarthritis, unspecified site: Secondary | ICD-10-CM | POA: Diagnosis not present

## 2019-11-16 DIAGNOSIS — Z79899 Other long term (current) drug therapy: Secondary | ICD-10-CM | POA: Diagnosis not present

## 2019-11-16 DIAGNOSIS — J45909 Unspecified asthma, uncomplicated: Secondary | ICD-10-CM | POA: Insufficient documentation

## 2019-11-16 DIAGNOSIS — Z791 Long term (current) use of non-steroidal anti-inflammatories (NSAID): Secondary | ICD-10-CM | POA: Diagnosis not present

## 2019-11-16 DIAGNOSIS — D122 Benign neoplasm of ascending colon: Secondary | ICD-10-CM | POA: Diagnosis not present

## 2019-11-16 DIAGNOSIS — I2510 Atherosclerotic heart disease of native coronary artery without angina pectoris: Secondary | ICD-10-CM | POA: Insufficient documentation

## 2019-11-16 DIAGNOSIS — R519 Headache, unspecified: Secondary | ICD-10-CM | POA: Insufficient documentation

## 2019-11-16 DIAGNOSIS — Z8261 Family history of arthritis: Secondary | ICD-10-CM | POA: Insufficient documentation

## 2019-11-16 DIAGNOSIS — D509 Iron deficiency anemia, unspecified: Secondary | ICD-10-CM | POA: Insufficient documentation

## 2019-11-16 DIAGNOSIS — R140 Abdominal distension (gaseous): Secondary | ICD-10-CM | POA: Diagnosis not present

## 2019-11-16 DIAGNOSIS — K449 Diaphragmatic hernia without obstruction or gangrene: Secondary | ICD-10-CM | POA: Diagnosis not present

## 2019-11-16 DIAGNOSIS — Z7951 Long term (current) use of inhaled steroids: Secondary | ICD-10-CM | POA: Diagnosis not present

## 2019-11-16 DIAGNOSIS — K5730 Diverticulosis of large intestine without perforation or abscess without bleeding: Secondary | ICD-10-CM | POA: Diagnosis not present

## 2019-11-16 DIAGNOSIS — Z7982 Long term (current) use of aspirin: Secondary | ICD-10-CM | POA: Diagnosis not present

## 2019-11-16 DIAGNOSIS — Z8371 Family history of colonic polyps: Secondary | ICD-10-CM | POA: Diagnosis not present

## 2019-11-16 DIAGNOSIS — F419 Anxiety disorder, unspecified: Secondary | ICD-10-CM | POA: Diagnosis not present

## 2019-11-16 DIAGNOSIS — Z90710 Acquired absence of both cervix and uterus: Secondary | ICD-10-CM | POA: Diagnosis not present

## 2019-11-16 DIAGNOSIS — K644 Residual hemorrhoidal skin tags: Secondary | ICD-10-CM | POA: Diagnosis not present

## 2019-11-16 DIAGNOSIS — K219 Gastro-esophageal reflux disease without esophagitis: Secondary | ICD-10-CM | POA: Insufficient documentation

## 2019-11-16 DIAGNOSIS — Z8719 Personal history of other diseases of the digestive system: Secondary | ICD-10-CM | POA: Diagnosis not present

## 2019-11-16 DIAGNOSIS — D124 Benign neoplasm of descending colon: Secondary | ICD-10-CM | POA: Diagnosis not present

## 2019-11-16 HISTORY — PX: ESOPHAGOGASTRODUODENOSCOPY (EGD) WITH PROPOFOL: SHX5813

## 2019-11-16 HISTORY — PX: COLONOSCOPY WITH PROPOFOL: SHX5780

## 2019-11-16 HISTORY — DX: Anxiety disorder, unspecified: F41.9

## 2019-11-16 SURGERY — COLONOSCOPY WITH PROPOFOL
Anesthesia: General

## 2019-11-16 MED ORDER — PROPOFOL 500 MG/50ML IV EMUL
INTRAVENOUS | Status: AC
  Filled 2019-11-16: qty 50

## 2019-11-16 MED ORDER — SODIUM CHLORIDE 0.9 % IV SOLN
INTRAVENOUS | Status: DC
  Administered 2019-11-16: 1000 mL via INTRAVENOUS

## 2019-11-16 MED ORDER — LIDOCAINE HCL (CARDIAC) PF 100 MG/5ML IV SOSY
PREFILLED_SYRINGE | INTRAVENOUS | Status: DC | PRN
  Administered 2019-11-16: 50 mg via INTRAVENOUS

## 2019-11-16 MED ORDER — PROPOFOL 500 MG/50ML IV EMUL
INTRAVENOUS | Status: DC | PRN
  Administered 2019-11-16: 150 ug/kg/min via INTRAVENOUS

## 2019-11-16 MED ORDER — PROPOFOL 10 MG/ML IV BOLUS
INTRAVENOUS | Status: DC | PRN
  Administered 2019-11-16: 50 mg via INTRAVENOUS

## 2019-11-16 NOTE — Op Note (Signed)
[facility] Gastroenterology Patient Name: Patricia Pacheco Procedure Date: 11/16/2019 9:42 AM MRN: [id number] Account #: [id number] Date of Birth: 22-Dec-1940 Admit Type: Outpatient Age: 79 Room: [facility] ENDO ROOM 1 Gender: Female Note Status: Finalized Procedure:             Colonoscopy Indications:           Last colonoscopy: April 2015, Unexplained iron                         deficiency anemia Providers:             Lin Landsman MD, MD Medicines:             Monitored Anesthesia Care Complications:         No immediate complications. Estimated blood loss: None. Procedure:             Pre-Anesthesia Assessment:                        - Prior to the procedure, a History and Physical was                         performed, and patient medications and allergies were                         reviewed. The patient is competent. The risks and                         benefits of the procedure and the sedation options and                         risks were discussed with the patient. All questions                         were answered and informed consent was obtained.                         Patient identification and proposed procedure were                         verified by the physician, the nurse, the                         anesthesiologist, the anesthetist and the technician                         in the pre-procedure area in the procedure room in the                         endoscopy suite. Mental Status Examination: alert and                         oriented. Airway Examination: normal oropharyngeal                         airway and neck mobility. Respiratory Examination:                         clear to auscultation. CV Examination: normal.  Prophylactic Antibiotics: The patient does not require                         prophylactic antibiotics. Prior Anticoagulants: The                         patient has taken no previous anticoagulant or                          antiplatelet agents. ASA Grade Assessment: II - A                         patient with mild systemic disease. After reviewing                         the risks and benefits, the patient was deemed in                         satisfactory condition to undergo the procedure. The                         anesthesia plan was to use monitored anesthesia care                         (MAC). Immediately prior to administration of                         medications, the patient was re-assessed for adequacy                         to receive sedatives. The heart rate, respiratory                         rate, oxygen saturations, blood pressure, adequacy of                         pulmonary ventilation, and response to care were                         monitored throughout the procedure. The physical                         status of the patient was re-assessed after the                         procedure.                        After obtaining informed consent, the colonoscope was                         passed under direct vision. Throughout the procedure,                         the patient's blood pressure, pulse, and oxygen                         saturations were monitored continuously. The  Colonoscope was introduced through the anus and                         advanced to the the terminal ileum, with                         identification of the appendiceal orifice and IC                         valve. The colonoscopy was performed with moderate                         difficulty due to multiple diverticula in the colon                         and significant looping. Successful completion of the                         procedure was aided by applying abdominal pressure.                         The patient tolerated the procedure well. The quality                         of the bowel preparation was evaluated using the BBPS                          [facility] Bowel Preparation Scale) with scores of: Right                         Colon = 3, Transverse Colon = 3 and Left Colon = 3                         (entire mucosa seen well with no residual staining,                         small fragments of stool or opaque liquid). The total                         BBPS score equals 9. Findings:      The perianal and digital rectal examinations were normal. Pertinent       negatives include normal sphincter tone and no palpable rectal lesions.      The terminal ileum appeared normal.      Three sessile polyps were found in the descending colon and ascending       colon. The polyps were 3 to 6 mm in size. These polyps were removed with       a cold snare. Resection and retrieval were complete.      Multiple diverticula were found in the sigmoid colon.      Non-bleeding external hemorrhoids were found during retroflexion. The       hemorrhoids were medium-sized. Impression:            - The examined portion of the ileum was normal.                        - Three 3 to 6 mm polyps in  the descending colon and                         in the ascending colon, removed with a cold snare.                         Resected and retrieved.                        - Diverticulosis in the sigmoid colon.                        - Non-bleeding external hemorrhoids. Recommendation:        - Discharge patient to home (with escort).                        - Resume previous diet today.                        - Continue present medications.                        - Await pathology results.                        - Repeat colonoscopy in 3 - 5 years for surveillance.                        - Return to my office as previously scheduled. Procedure Code(s):     --- Professional ---                        [phone], Colonoscopy, flexible; with removal of                         tumor(s), polyp(s), or other lesion(s) by snare                         technique Diagnosis Code(s):      --- Professional ---                        K64.4, Residual hemorrhoidal skin tags                        K63.5, Polyp of colon                        D50.9, Iron deficiency anemia, unspecified                        K57.30, Diverticulosis of large intestine without                         perforation or abscess without bleeding CPT copyright 2019 American Medical Association. All rights reserved. The codes documented in this report are preliminary and upon coder review may  be revised to meet current compliance requirements. Dr. Ulyess Mort Lin Landsman MD, MD 11/16/2019 10:24:55 AM This report has been signed electronically. Number of Addenda: 0 Note Initiated On: 11/16/2019 9:42 AM Scope Withdrawal Time: 0 hours 10 minutes 37 seconds  Total Procedure Duration: 0 hours 16 minutes  46 seconds  Estimated Blood Loss:  Estimated blood loss: none.      [facility]

## 2019-11-16 NOTE — Op Note (Signed)
[facility] Gastroenterology Patient Name: Patricia Pacheco Procedure Date: 11/16/2019 9:42 AM MRN: [id number] Account #: [id number] Date of Birth: [birth year] Admit Type: Outpatient Age: 79 Room: [facility] ENDO ROOM 1 Gender: Female Note Status: Finalized Procedure:             Upper GI endoscopy Indications:           Unexplained iron deficiency anemia, Abdominal bloating Providers:             Lin Landsman MD, MD Medicines:             Monitored Anesthesia Care Complications:         No immediate complications. Estimated blood loss: None. Procedure:             Pre-Anesthesia Assessment:                        - Prior to the procedure, a History and Physical was                         performed, and patient medications and allergies were                         reviewed. The patient is competent. The risks and                         benefits of the procedure and the sedation options and                         risks were discussed with the patient. All questions                         were answered and informed consent was obtained.                         Patient identification and proposed procedure were                         verified by the physician, the nurse, the                         anesthesiologist, the anesthetist and the technician                         in the pre-procedure area in the procedure room in the                         endoscopy suite. Mental Status Examination: alert and                         oriented. Airway Examination: normal oropharyngeal                         airway and neck mobility. Respiratory Examination:                         clear to auscultation. CV Examination: normal.                         Prophylactic Antibiotics: The  patient does not require                         prophylactic antibiotics. Prior Anticoagulants: The                         patient has taken no previous anticoagulant or   antiplatelet agents. ASA Grade Assessment: II - A                         patient with mild systemic disease. After reviewing                         the risks and benefits, the patient was deemed in                         satisfactory condition to undergo the procedure. The                         anesthesia plan was to use monitored anesthesia care                         (MAC). Immediately prior to administration of                         medications, the patient was re-assessed for adequacy                         to receive sedatives. The heart rate, respiratory                         rate, oxygen saturations, blood pressure, adequacy of                         pulmonary ventilation, and response to care were                         monitored throughout the procedure. The physical                         status of the patient was re-assessed after the                         procedure.                        After obtaining informed consent, the endoscope was                         passed under direct vision. Throughout the procedure,                         the patient's blood pressure, pulse, and oxygen                         saturations were monitored continuously. The Endoscope                         was introduced through the mouth, and advanced to the  second part of duodenum. The upper GI endoscopy was                         accomplished without difficulty. The patient tolerated                         the procedure well. Findings:      The examined duodenum was normal. Biopsies for histology were taken with       a cold forceps for evaluation of celiac disease.      A small hiatal hernia was present.      The entire examined stomach was normal. Biopsies were taken with a cold       forceps for Helicobacter pylori testing.      The cardia and gastric fundus were normal on retroflexion.      The gastroesophageal junction and examined esophagus were  normal. Impression:            - Normal examined duodenum. Biopsied.                        - Small hiatal hernia.                        - Normal stomach. Biopsied.                        - Normal gastroesophageal junction and esophagus. Recommendation:        - Await pathology results.                        - Proceed with colonoscopy as scheduled                        See colonoscopy report Procedure Code(s):     --- Professional ---                        [phone], Esophagogastroduodenoscopy, flexible,                         transoral; with biopsy, single or multiple Diagnosis Code(s):     --- Professional ---                        K44.9, Diaphragmatic hernia without obstruction or                         gangrene                        D50.9, Iron deficiency anemia, unspecified                        R14.0, Abdominal distension (gaseous) CPT copyright 2019 American Medical Association. All rights reserved. The codes documented in this report are preliminary and upon coder review may  be revised to meet current compliance requirements. Dr. Ulyess Mort Lin Landsman MD, MD 11/16/2019 10:03:29 AM This report has been signed electronically. Number of Addenda: 0 Note Initiated On: 11/16/2019 9:42 AM Estimated Blood Loss:  Estimated blood loss: none.      [facility]

## 2019-11-16 NOTE — Anesthesia Preprocedure Evaluation (Addendum)
Anesthesia Evaluation  Patient identified by MRN, date of birth, ID band Patient awake    Reviewed: Allergy & Precautions, NPO status , Patient's Chart, lab work & pertinent test results  History of Anesthesia Complications Negative for: history of anesthetic complications  Airway Mallampati: II  TM Distance: >3 FB Neck ROM: Full    Dental  (+) Chipped   Pulmonary asthma , neg sleep apnea,    breath sounds clear to auscultation- rhonchi (-) wheezing      Cardiovascular hypertension, (-) CAD, (-) Past MI, (-) Cardiac Stents and (-) CABG  Rhythm:Regular Rate:Normal - Systolic murmurs and - Diastolic murmurs Echo stress test 03/18/17: NORMAL STRESS TEST. NORMAL RESTING STUDY WITH NO WALL MOTION ABNORMALITIES  AT REST AND PEAK STRESS.  NO VALVULAR REGURGITATION  NO VALVULAR STENOSIS  Maximum workload of 4.60 METs was achieved during exercise.  NORMAL RESTING BP - APPROPRIATE RESPONSE   Neuro/Psych  Headaches, neg Seizures Anxiety    GI/Hepatic Neg liver ROS, GERD  ,  Endo/Other  negative endocrine ROSneg diabetes  Renal/GU negative Renal ROS     Musculoskeletal  (+) Arthritis ,   Abdominal (+) - obese,   Peds  Hematology negative hematology ROS (+)   Anesthesia Other Findings Past Medical History: No date: Allergy     Comment:  allergy shots (Juengel) No date: Anxiety No date: Arthritis No date: Asthma No date: CAD (coronary artery disease)     Comment:  cath 11/27/04 - moderated LAD disease No date: Cancer [facility])     Comment:  skin No date: GERD (gastroesophageal reflux disease)     Comment:  EGD (05/25/04) - slight presbyesophagus, mild               esophagitis, mild gastritis, mild duodenitis - positive               H. pylori.  No date: Neuropathy   Reproductive/Obstetrics                            Anesthesia Physical Anesthesia Plan  ASA: II  Anesthesia Plan: General    Post-op Pain Management:    Induction: Intravenous  PONV Risk Score and Plan: 2 and Propofol infusion  Airway Management Planned: Natural Airway  Additional Equipment:   Intra-op Plan:   Post-operative Plan:   Informed Consent: I have reviewed the patients History and Physical, chart, labs and discussed the procedure including the risks, benefits and alternatives for the proposed anesthesia with the patient or authorized representative who has indicated his/her understanding and acceptance.     Dental advisory given  Plan Discussed with: CRNA and Anesthesiologist  Anesthesia Plan Comments:         Anesthesia Quick Evaluation

## 2019-11-16 NOTE — Anesthesia Procedure Notes (Signed)
Date/Time: 11/16/2019 9:52 AM Performed by: Johnna Acosta, CRNA Pre-anesthesia Checklist: Patient identified, Emergency Drugs available, Suction available, Patient being monitored and Timeout performed Patient Re-evaluated:Patient Re-evaluated prior to induction Oxygen Delivery Method: Nasal cannula Preoxygenation: Pre-oxygenation with 100% oxygen Induction Type: IV induction

## 2019-11-16 NOTE — Anesthesia Postprocedure Evaluation (Signed)
Anesthesia Post Note  Patient: Patricia Pacheco  Procedure(s) Performed: COLONOSCOPY WITH PROPOFOL (N/A ) ESOPHAGOGASTRODUODENOSCOPY (EGD) WITH PROPOFOL (N/A )  Patient location during evaluation: Endoscopy Anesthesia Type: General Level of consciousness: awake and alert and oriented Pain management: pain level controlled Vital Signs Assessment: post-procedure vital signs reviewed and stable Respiratory status: spontaneous breathing, nonlabored ventilation and respiratory function stable Cardiovascular status: blood pressure returned to baseline and stable Postop Assessment: no signs of nausea or vomiting Anesthetic complications: no     Last Vitals:  Vitals:   11/16/19 1030 11/16/19 1036  BP:  139/72  Pulse: 73   Resp: 14   Temp:    SpO2: 97%     Last Pain:  Vitals:   11/16/19 1046  TempSrc:   PainSc: 0-No pain                 Amy Penwarden

## 2019-11-16 NOTE — H&P (Signed)
Cephas Darby, MD [address]  [place]  [place], [place] [address]  Main: [phone]  Fax: [phone] Pager: [phone]  Primary Care Physician:  Einar Pheasant, MD Primary Gastroenterologist:  Dr. Cephas Darby  Pre-Procedure History & Physical: HPI:  Patricia Pacheco is a 79 y.o. female is here for an endoscopy and colonoscopy.   Past Medical History:  Diagnosis Date  . Allergy    allergy shots (Juengel)  . Anxiety   . Arthritis   . Asthma   . CAD (coronary artery disease)    cath 11/27/04 - moderated LAD disease  . Cancer ([place])    skin  . GERD (gastroesophageal reflux disease)    EGD (05/25/04) - slight presbyesophagus, mild esophagitis, mild gastritis, mild duodenitis - positive H. pylori.   Marland Kitchen Neuropathy     Past Surgical History:  Procedure Laterality Date  . ABDOMINAL HYSTERECTOMY    . APPENDECTOMY  1967    Prior to Admission medications   Medication Sig Start Date End Date Taking? Authorizing Provider  ALPRAZolam Duanne Moron) 0.25 MG tablet TAKE ONE TABLET EVERY DAY AS NEEDED FOR ANXIETY 09/03/19   Einar Pheasant, MD  antiseptic oral rinse (BIOTENE) LIQD 15 mLs by Mouth Rinse route as needed for dry mouth.    [provider]  Ascorbic Acid (VITAMIN C PO) Take by mouth daily.    [provider]  aspirin 81 MG tablet Take 81 mg by mouth daily.    [provider]  BIOTIN PO Take by mouth daily.    [provider]  Calcium Carbonate-Vit D-Min (CALCIUM 1200 PO) Take by mouth daily.    [provider]  celecoxib (CELEBREX) 200 MG capsule Take 1 capsule (200 mg total) by mouth daily. 07/15/19   Hyatt, Max T, DPM  COD LIVER OIL PO Take by mouth.    [provider]  Coenzyme Q10 (CO Q-10) 100 MG CAPS Take 1 capsule by mouth daily.    [provider]  docusate sodium (COLACE) 100 MG capsule Take by mouth.    [provider]  ezetimibe (ZETIA) 10 MG tablet TAKE ONE TABLET BY MOUTH EVERY DAY  01/13/19   Einar Pheasant, MD  fexofenadine (ALLEGRA) 180 MG tablet Take 1 tablet (180 mg total) by mouth daily as needed for allergies or rhinitis. 11/22/16   Wilhelmina Mcardle, MD  fluocinolone (SYNALAR) 0.01 % external solution Apply topically 2 (two) times daily.    [provider]  FLUoxetine (PROZAC) 10 MG capsule TAKE 1 CAPSULE EVERY DAY 08/13/19   Einar Pheasant, MD  lidocaine (XYLOCAINE) 5 % ointment Apply 1 application topically as needed. 04/24/19   Guse, Jacquelynn Cree, FNP  mometasone (ELOCON) 0.1 % lotion Apply 1 application topically as directed. 10/06/16   [provider]  Multiple Vitamin (MULTIVITAMIN) tablet Take 1 tablet by mouth daily.    [provider]  mupirocin ointment (BACTROBAN) 2 % Place 1 application into the nose 2 (two) times daily. 12/06/14   Einar Pheasant, MD  Omega-3 Fatty Acids (FISH OIL PO) Take by mouth daily.    [provider]  omeprazole (PRILOSEC) 40 MG capsule TAKE 1 CAPSULE EVERY DAY 08/13/19   Einar Pheasant, MD  [facility] HFA 108 [phone] Base) MCG/ACT inhaler  09/12/18   [provider]  Probiotic Product (PROBIOTIC PO) Take by mouth.    [provider]  silver sulfADIAZINE (SILVADENE) 1 % cream Apply 1 application topically daily. 03/31/15   Einar Pheasant, MD  triamcinolone cream (KENALOG) 0.1 %  07/03/17   [provider]  VITAMIN D PO Take by mouth daily.    [provider]    Allergies as of 10/27/2019  . (No Known Allergies)    Family History  Problem Relation Age of Onset  . Arthritis Mother   . Colon polyps Sister   . Breast cancer Neg Hx     Social History   Socioeconomic History  . Marital status: Married    Spouse name: Not on file  . Number of children: Not on file  . Years of education: Not on file  . Highest education level: Not on file  Occupational History  . Not on file  Tobacco Use  . Smoking status: Never Smoker  . Smokeless tobacco: Never Used  Substance and  Sexual Activity  . Alcohol use: No    Alcohol/week: 0.0 standard drinks  . Drug use: No  . Sexual activity: Not on file  Other Topics Concern  . Not on file  Social History Narrative  . Not on file   Social Determinants of Health   Financial Resource Strain:   . Difficulty of Paying Living Expenses:   Food Insecurity:   . Worried About Charity fundraiser in the Last Year:   . Arboriculturist in the Last Year:   Transportation Needs:   . Film/video editor (Medical):   Marland Kitchen Lack of Transportation (Non-Medical):   Physical Activity:   . Days of Exercise per Week:   . Minutes of Exercise per Session:   Stress:   . Feeling of Stress :   Social Connections:   . Frequency of Communication with Friends and Family:   . Frequency of Social Gatherings with Friends and Family:   . Attends Religious Services:   . Active Member of Clubs or Organizations:   . Attends Archivist Meetings:   Marland Kitchen Marital Status:   Intimate Partner Violence:   . Fear of Current or Ex-Partner:   . Emotionally Abused:   Marland Kitchen Physically Abused:   . Sexually Abused:     Review of Systems: See HPI, otherwise negative ROS  Physical Exam: BP (!) 183/83   Pulse 79   Temp 97.6 F (36.4 C) (Tympanic)   Resp 18   Ht 5' 1.5" (1.562 m)   Wt 59 kg   LMP 08/29/1995   SpO2 100%   BMI 24.17 kg/m  General:   Alert,  pleasant and cooperative in NAD Head:  Normocephalic and atraumatic. Neck:  Supple; no masses or thyromegaly. Lungs:  Clear throughout to auscultation.    Heart:  Regular rate and rhythm. Abdomen:  Soft, nontender and nondistended. Normal bowel sounds, without guarding, and without rebound.   Neurologic:  Alert and  oriented x4;  grossly normal neurologically.  Impression/Plan: Patricia Pacheco is here for an endoscopy and colonoscopy to be performed for Iron deficiency anemia   Risks, benefits, limitations, and alternatives regarding  endoscopy and colonoscopy have been reviewed with the  patient.  Questions have been answered.  All parties agreeable.   Sherri Sear, MD  11/16/2019, 9:50 AM

## 2019-11-16 NOTE — Transfer of Care (Signed)
Immediate Anesthesia Transfer of Care Note  Patient: Sham H Morace  Procedure(s) Performed: COLONOSCOPY WITH PROPOFOL (N/A ) ESOPHAGOGASTRODUODENOSCOPY (EGD) WITH PROPOFOL (N/A )  Patient Location: PACU  Anesthesia Type:General  Level of Consciousness: awake and alert   Airway & Oxygen Therapy: Patient Spontanous Breathing and Patient connected to nasal cannula oxygen  Post-op Assessment: Report given to RN and Post -op Vital signs reviewed and stable  Post vital signs: Reviewed and stable  Last Vitals:  Vitals Value Taken Time  BP 125/66 11/16/19 1026  Temp 36.6 C 11/16/19 1026  Pulse 73 11/16/19 1030  Resp 14 11/16/19 1030  SpO2 100 % 11/16/19 1030  Vitals shown include unvalidated device data.  Last Pain:  Vitals:   11/16/19 1029  TempSrc:   PainSc: 0-No pain         Complications: No apparent anesthesia complications

## 2019-11-17 ENCOUNTER — Encounter: Payer: Self-pay | Admitting: *Deleted

## 2019-11-17 ENCOUNTER — Encounter: Payer: Self-pay | Admitting: Gastroenterology

## 2019-11-17 LAB — SURGICAL PATHOLOGY

## 2019-11-18 ENCOUNTER — Telehealth: Payer: Self-pay

## 2019-11-18 NOTE — Telephone Encounter (Signed)
Patient verbalized understanding. She states she would like to talk to her daughter before  She scheduled the procedure to see if she thinks she should have it done. Patient states she will give [place] a call back if she decides to do it

## 2019-11-18 NOTE — Telephone Encounter (Signed)
-----   Message from Lin Landsman, MD sent at 11/17/2019  5:13 PM EDT ----- Upper endoscopy is unremarkable. She does not have H. pylori infection or celiac disease. Recommend video capsule endoscopy to evaluate for iron deficiency anemia  RV

## 2019-11-19 ENCOUNTER — Other Ambulatory Visit: Payer: Self-pay | Admitting: Internal Medicine

## 2019-11-25 ENCOUNTER — Telehealth: Payer: Self-pay

## 2019-11-25 NOTE — Telephone Encounter (Signed)
-----   Message from Shelby Mattocks, [place] sent at 11/18/2019  8:11 AM EDT ----- Call to see if patient has decided to do capsule study

## 2019-11-25 NOTE — Telephone Encounter (Signed)
Called and left a message for call back informed patient to call us back when she is ready to scheduled on message

## 2020-01-06 DIAGNOSIS — M9902 Segmental and somatic dysfunction of thoracic region: Secondary | ICD-10-CM | POA: Diagnosis not present

## 2020-01-06 DIAGNOSIS — M9903 Segmental and somatic dysfunction of lumbar region: Secondary | ICD-10-CM | POA: Diagnosis not present

## 2020-01-06 DIAGNOSIS — M5416 Radiculopathy, lumbar region: Secondary | ICD-10-CM | POA: Diagnosis not present

## 2020-01-06 DIAGNOSIS — M5134 Other intervertebral disc degeneration, thoracic region: Secondary | ICD-10-CM | POA: Diagnosis not present

## 2020-01-12 ENCOUNTER — Ambulatory Visit (INDEPENDENT_AMBULATORY_CARE_PROVIDER_SITE_OTHER): Payer: PPO

## 2020-01-12 VITALS — Ht 61.5 in | Wt 130.0 lb

## 2020-01-12 DIAGNOSIS — Z0000 Encounter for general adult medical examination without abnormal findings: Secondary | ICD-10-CM

## 2020-01-12 DIAGNOSIS — M9903 Segmental and somatic dysfunction of lumbar region: Secondary | ICD-10-CM | POA: Diagnosis not present

## 2020-01-12 DIAGNOSIS — M9902 Segmental and somatic dysfunction of thoracic region: Secondary | ICD-10-CM | POA: Diagnosis not present

## 2020-01-12 DIAGNOSIS — M5416 Radiculopathy, lumbar region: Secondary | ICD-10-CM | POA: Diagnosis not present

## 2020-01-12 DIAGNOSIS — M5134 Other intervertebral disc degeneration, thoracic region: Secondary | ICD-10-CM | POA: Diagnosis not present

## 2020-01-12 NOTE — Progress Notes (Addendum)
Subjective:   Patricia Pacheco is a 79 y.o. female who presents for Medicare Annual (Subsequent) preventive examination.  Review of Systems    No ROS.  Medicare Wellness Virtual Visit.   Cardiac Risk Factors include: advanced age (>86men, >6 women);hypertension     Objective:    Today's Vitals   01/12/20 1231  Weight: 130 lb (59 kg)  Height: 5' 1.5" (1.562 m)   Body mass index is 24.17 kg/m.  Advanced Directives 01/12/2020 11/16/2019 12/16/2017  Does Patient Have a Medical Advance Directive? Yes Yes Yes  Type of Paramedic of [place];Living will - [place];Living will  Does patient want to make changes to medical advance directive? No - Patient declined - No - Patient declined  Copy of [place] in Chart? No - copy requested - No - copy requested    Current Medications (verified) Outpatient Encounter Medications as of 01/12/2020  Medication Sig  . ALPRAZolam (XANAX) 0.25 MG tablet TAKE ONE TABLET EVERY DAY AS NEEDED FOR ANXIETY  . antiseptic oral rinse (BIOTENE) LIQD 15 mLs by Mouth Rinse route as needed for dry mouth.  . Ascorbic Acid (VITAMIN C PO) Take by mouth daily.  Marland Kitchen aspirin 81 MG tablet Take 81 mg by mouth daily.  Marland Kitchen BIOTIN PO Take by mouth daily.  . Calcium Carbonate-Vit D-Min (CALCIUM 1200 PO) Take by mouth daily.  . celecoxib (CELEBREX) 200 MG capsule Take 1 capsule (200 mg total) by mouth daily.  . COD LIVER OIL PO Take by mouth.  . Coenzyme Q10 (CO Q-10) 100 MG CAPS Take 1 capsule by mouth daily.  Marland Kitchen docusate sodium (COLACE) 100 MG capsule Take by mouth.  . ezetimibe (ZETIA) 10 MG tablet TAKE ONE TABLET BY MOUTH EVERY DAY  . fexofenadine (ALLEGRA) 180 MG tablet Take 1 tablet (180 mg total) by mouth daily as needed for allergies or rhinitis.  . fluocinolone (SYNALAR) 0.01 % external solution Apply topically 2 (two) times daily.  Marland Kitchen FLUoxetine (PROZAC) 10 MG capsule TAKE 1 CAPSULE EVERY DAY  . lidocaine  (XYLOCAINE) 5 % ointment Apply 1 application topically as needed.  . mometasone (ELOCON) 0.1 % lotion Apply 1 application topically as directed.  . Multiple Vitamin (MULTIVITAMIN) tablet Take 1 tablet by mouth daily.  . mupirocin ointment (BACTROBAN) 2 % Place 1 application into the nose 2 (two) times daily.  . Omega-3 Fatty Acids (FISH OIL PO) Take by mouth daily.  Marland Kitchen omeprazole (PRILOSEC) 40 MG capsule TAKE 1 CAPSULE EVERY DAY  . PROAIR HFA 108 (90 Base) MCG/ACT inhaler   . Probiotic Product (PROBIOTIC PO) Take by mouth.  . silver sulfADIAZINE (SILVADENE) 1 % cream Apply 1 application topically daily.  Marland Kitchen triamcinolone cream (KENALOG) 0.1 %   . VITAMIN D PO Take by mouth daily.   No facility-administered encounter medications on file as of 01/12/2020.    Allergies (verified) Patient has no known allergies.   History: Past Medical History:  Diagnosis Date  . Allergy    allergy shots ([place])  . Anxiety   . Arthritis   . Asthma   . CAD (coronary artery disease)    cath 11/27/04 - moderated LAD disease  . Cancer ([place])    skin  . GERD (gastroesophageal reflux disease)    EGD (05/25/04) - slight presbyesophagus, mild esophagitis, mild gastritis, mild duodenitis - positive H. pylori.   Marland Kitchen Neuropathy    Past Surgical History:  Procedure Laterality Date  . ABDOMINAL HYSTERECTOMY    .  APPENDECTOMY  1967  . COLONOSCOPY WITH PROPOFOL N/A 11/16/2019   Procedure: COLONOSCOPY WITH PROPOFOL;  Surgeon: Lin Landsman, MD;  Location: [facility] ENDOSCOPY;  Service: Gastroenterology;  Laterality: N/A;  . ESOPHAGOGASTRODUODENOSCOPY (EGD) WITH PROPOFOL N/A 11/16/2019   Procedure: ESOPHAGOGASTRODUODENOSCOPY (EGD) WITH PROPOFOL;  Surgeon: Lin Landsman, MD;  Location: [facility] ENDOSCOPY;  Service: Gastroenterology;  Laterality: N/A;   Family History  Problem Relation Age of Onset  . Arthritis Mother   . Stroke Sister   . Breast cancer Neg Hx    Social History   Socioeconomic History  .  Marital status: Married    Spouse name: Not on file  . Number of children: Not on file  . Years of education: Not on file  . Highest education level: Not on file  Occupational History  . Not on file  Tobacco Use  . Smoking status: Never Smoker  . Smokeless tobacco: Never Used  Vaping Use  . Vaping Use: Never used  Substance and Sexual Activity  . Alcohol use: No    Alcohol/week: 0.0 standard drinks  . Drug use: No  . Sexual activity: Not on file  Other Topics Concern  . Not on file  Social History Narrative  . Not on file   Social Determinants of Health   Financial Resource Strain: Low Risk   . Difficulty of Paying Living Expenses: Not hard at all  Food Insecurity:   . Worried About Charity fundraiser in the Last Year:   . Arboriculturist in the Last Year:   Transportation Needs:   . Film/video editor (Medical):   Marland Kitchen Lack of Transportation (Non-Medical):   Physical Activity:   . Days of Exercise per Week:   . Minutes of Exercise per Session:   Stress:   . Feeling of Stress :   Social Connections: Socially Integrated  . Frequency of Communication with Friends and Family: More than three times a week  . Frequency of Social Gatherings with Friends and Family: Twice a week  . Attends Religious Services: More than 4 times per year  . Active Member of Clubs or Organizations: Yes  . Attends Archivist Meetings: Not on file  . Marital Status: Married    Tobacco Counseling Counseling given: Not Answered   Clinical Intake:  Pre-visit preparation completed: Yes        Diabetes: No  How often do you need to have someone help you when you read instructions, pamphlets, or other written materials from your doctor or pharmacy?: 1 - Never   Interpreter Needed?: No      Activities of Daily Living In your present state of health, do you have any difficulty performing the following activities: 01/12/2020  Hearing? N  Vision? N  Difficulty  concentrating or making decisions? N  Walking or climbing stairs? Y  Comment Notes balance is a little unsteady. Holds onto railing when available.  Dressing or bathing? N  Doing errands, shopping? N  Preparing Food and eating ? N  Using the Toilet? N  In the past six months, have you accidently leaked urine? N  Do you have problems with loss of bowel control? N  Managing your Medications? N  Managing your Finances? N  Housekeeping or managing your Housekeeping? N  Some recent data might be hidden    Patient Care Team: Einar Pheasant, MD as PCP - General (Internal Medicine) Einar Pheasant, MD (Internal Medicine) Blima Rich, MD as Referring Physician (Obstetrics and Gynecology)  Indicate any recent Medical Services you may have received from other than [facility] providers in the past year (date may be approximate).     Assessment:   This is a routine wellness examination for Journiee.  I connected with Raquelle today by telephone and verified that I am speaking with the correct person using two identifiers. Location patient: home Location provider: work Persons participating in the virtual visit: patient, Marine scientist.    I discussed the limitations, risks, security and privacy concerns of performing an evaluation and management service by telephone and the availability of in person appointments. The patient expressed understanding and verbally consented to this telephonic visit.    Interactive audio and video telecommunications were attempted between this provider and patient, however failed, due to patient having technical difficulties OR patient did not have access to video capability.  We continued and completed visit with audio only.  Some vital signs may be absent or patient reported.   Hearing/Vision screen  Hearing Screening   125Hz  250Hz  500Hz  1000Hz  2000Hz  3000Hz  4000Hz  6000Hz  8000Hz   Right ear:           Left ear:           Comments: Patient has difficulty hearing R ear. No  hearing aids worn. Followed by Dr. Pryor Ochoa.    Vision Screening Comments: Followed [facility]  Wears corrective lenses  Annual visits  Lasik complete  Cataract extraction, bilateral  Visual acuity not assessed per patient preference since they have regular follow up with the ophthalmologist.  Virtual visit.     Dietary issues and exercise activities discussed: Current Exercise Habits: Home exercise routine, Intensity: MildHealthy diet Fair water intake Caffeine- 3 cups daily  Goals    . DIET - INCREASE LEAN PROTEINS     Low cholesterol diet    . DIET - INCREASE WATER INTAKE     Monitor caffeine intake For every 1 cup of coffee drink 2 cups of water Stay hydrated      Depression Screen PHQ 2/9 Scores 01/12/2020 12/19/2018 12/16/2017 07/01/2017 04/11/2017 03/28/2016 11/01/2014  PHQ - 2 Score 0 0 0 0 0 0 0  PHQ- 9 Score - - - - 0 - -    Fall Risk Fall Risk  01/12/2020 01/22/2019 12/16/2017 07/01/2017 04/11/2017  Falls in the past year? 0 0 No No No  Comment - Emmi Telephone Survey: data to providers prior to load - - -  Number falls in past yr: 0 - - - -  Injury with Fall? 0 - - - -  Follow up Falls evaluation completed - - - -    Handrails in use when climbing stairs? Yes  Home free of loose throw rugs in walkways, pet beds, electrical cords, etc? Yes  Adequate lighting in your home to reduce risk of falls? Yes   ASSISTIVE DEVICES UTILIZED TO PREVENT FALLS: Life alert? No  Use of a cane, walker or w/c? No  Grab bars in the bathroom? Yes  Shower chair or bench in shower? Yes  Elevated toilet seat or a handicapped toilet? Yes   TIMED UP AND GO:  Was the test performed? No .   Cognitive Function:  Patient is alert and oriented x3.   6CIT Screen 01/12/2020 12/16/2017  What Year? 0 points 0 points  What month? 0 points 0 points  What time? 0 points 0 points  Count back from 20 - 0 points  Months in reverse - 0 points  Repeat phrase - 0  points  Total Score - 0     Immunizations Immunization History  Administered Date(s) Administered  . Influenza, High Dose Seasonal PF 03/28/2016, 04/12/2017, 03/07/2018  . Influenza,inj,Quad PF,6+ Mos 06/23/2013, 05/03/2014, 05/11/2015  . Influenza-Unspecified 04/12/2017  . Moderna SARS-COVID-2 Vaccination 07/16/2019, 08/18/2019  . Pneumococcal Conjugate-13 05/11/2015  . Pneumococcal Polysaccharide-23 07/01/2017  . Zoster 05/31/2008   TDAP status: Due, Education has been provided regarding the importance of this vaccine. Advised may receive this vaccine at local pharmacy or Health Dept. Aware to provide a copy of the vaccination record if obtained from local pharmacy or Health Dept. Verbalized acceptance and understanding.  Health Maintenance Health Maintenance  Topic Date Due  . TETANUS/TDAP  Never done  . INFLUENZA VACCINE  01/24/2020  . MAMMOGRAM  09/27/2020  . COLONOSCOPY  11/16/2022  . DEXA SCAN  Completed  . COVID-19 Vaccine  Completed  . PNA vac Low Risk Adult  Completed   Dental Screening: Recommended annual dental exams for proper oral hygiene. Visits every 6 months.   Community Resource Referral / Chronic Care Management: CRR required this visit?  No   CCM required this visit?  No      Plan:   Keep all routine maintenance appointments.   Follow up 02/01/20 @ 11:00  I have personally reviewed and noted the following in the patient's chart:   . Medical and social history . Use of alcohol, tobacco or illicit drugs  . Current medications and supplements . Functional ability and status . Nutritional status . Physical activity . Advanced directives . List of other physicians . Hospitalizations, surgeries, and ER visits in previous 12 months . Vitals . Screenings to include cognitive, depression, and falls . Referrals and appointments  In addition, I have reviewed and discussed with patient certain preventive protocols, quality metrics, and best practice recommendations. A written  personalized care plan for preventive services as well as general preventive health recommendations were provided to patient via mail.     Varney Biles, LPN   06/27/129     Reviewed above information.  Agree with assessment and plan.    Dr Nicki Reaper

## 2020-01-12 NOTE — Patient Instructions (Addendum)
Patricia Pacheco , Thank you for taking time to come for your Medicare Wellness Visit. I appreciate your ongoing commitment to your health goals. Please review the following plan we discussed and let me know if I can assist you in the future.   These are the goals we discussed: Goals    . DIET - INCREASE LEAN PROTEINS     Low cholesterol diet    . DIET - INCREASE WATER INTAKE     Monitor caffeine intake For every 1 cup of coffee drink 2 cups of water Stay hydrated       This is a list of the screening recommended for you and due dates:  Health Maintenance  Topic Date Due  . Tetanus Vaccine  Never done  . Flu Shot  01/24/2020  . Mammogram  09/27/2020  . Colon Cancer Screening  11/16/2022  . DEXA scan (bone density measurement)  Completed  . COVID-19 Vaccine  Completed  . Pneumonia vaccines  Completed    Immunizations Immunization History  Administered Date(s) Administered  . Influenza, High Dose Seasonal PF 03/28/2016, 04/12/2017, 03/07/2018  . Influenza,inj,Quad PF,6+ Mos 06/23/2013, 05/03/2014, 05/11/2015  . Influenza-Unspecified 04/12/2017  . Moderna SARS-COVID-2 Vaccination 07/16/2019, 08/18/2019  . Pneumococcal Conjugate-13 05/11/2015  . Pneumococcal Polysaccharide-23 07/01/2017  . Zoster 05/31/2008   Keep all routine maintenance appointments.   Follow up 02/01/20 @ 11:00  Advanced directives: End of life planning; Advance aging; Advanced directives discussed.  Copy of current HCPOA/Living Will requested.    Conditions/risks identified: none new  Follow up in one year for your annual wellness visit    Preventive Care 65 Years and Older, Female Preventive care refers to lifestyle choices and visits with your health care provider that can promote health and wellness. What does preventive care include?  A yearly physical exam. This is also called an annual well check.  Dental exams once or twice a year.  Routine eye exams. Ask your health care provider how often  you should have your eyes checked.  Personal lifestyle choices, including:  Daily care of your teeth and gums.  Regular physical activity.  Eating a healthy diet.  Avoiding tobacco and drug use.  Limiting alcohol use.  Practicing safe sex.  Taking low-dose aspirin every day.  Taking vitamin and mineral supplements as recommended by your health care provider. What happens during an annual well check? The services and screenings done by your health care provider during your annual well check will depend on your age, overall health, lifestyle risk factors, and family history of disease. Counseling  Your health care provider may ask you questions about your:  Alcohol use.  Tobacco use.  Drug use.  Emotional well-being.  Home and relationship well-being.  Sexual activity.  Eating habits.  History of falls.  Memory and ability to understand (cognition).  Work and work Statistician.  Reproductive health. Screening  You may have the following tests or measurements:  Height, weight, and BMI.  Blood pressure.  Lipid and cholesterol levels. These may be checked every 5 years, or more frequently if you are over 24 years old.  Skin check.  Lung cancer screening. You may have this screening every year starting at age 80 if you have a 30-pack-year history of smoking and currently smoke or have quit within the past 15 years.  Fecal occult blood test (FOBT) of the stool. You may have this test every year starting at age 80.  Flexible sigmoidoscopy or colonoscopy. You may have a sigmoidoscopy every  5 years or a colonoscopy every 10 years starting at age 67.  Hepatitis C blood test.  Hepatitis B blood test.  Sexually transmitted disease (STD) testing.  Diabetes screening. This is done by checking your blood sugar (glucose) after you have not eaten for a while (fasting). You may have this done every 1-3 years.  Bone density scan. This is done to screen for  osteoporosis. You may have this done starting at age 80.  Mammogram. This may be done every 1-2 years. Talk to your health care provider about how often you should have regular mammograms. Talk with your health care provider about your test results, treatment options, and if necessary, the need for more tests. Vaccines  Your health care provider may recommend certain vaccines, such as:  Influenza vaccine. This is recommended every year.  Tetanus, diphtheria, and acellular pertussis (Tdap, Td) vaccine. You may need a Td booster every 10 years.  Zoster vaccine. You may need this after age 35.  Pneumococcal 13-valent conjugate (PCV13) vaccine. One dose is recommended after age 38.  Pneumococcal polysaccharide (PPSV23) vaccine. One dose is recommended after age 33. Talk to your health care provider about which screenings and vaccines you need and how often you need them. This information is not intended to replace advice given to you by your health care provider. Make sure you discuss any questions you have with your health care provider. Document Released: 07/08/2015 Document Revised: 02/29/2016 Document Reviewed: 04/12/2015 Elsevier Interactive Patient Education  2017 [place] Prevention in the Home Falls can cause injuries. They can happen to people of all ages. There are many things you can do to make your home safe and to help prevent falls. What can I do on the outside of my home?  Regularly fix the edges of walkways and driveways and fix any cracks.  Remove anything that might make you trip as you walk through a door, such as a raised step or threshold.  Trim any bushes or trees on the path to your home.  Use bright outdoor lighting.  Clear any walking paths of anything that might make someone trip, such as rocks or tools.  Regularly check to see if handrails are loose or broken. Make sure that both sides of any steps have handrails.  Any raised decks and porches  should have guardrails on the edges.  Have any leaves, snow, or ice cleared regularly.  Use sand or salt on walking paths during winter.  Clean up any spills in your garage right away. This includes oil or grease spills. What can I do in the bathroom?  Use night lights.  Install grab bars by the toilet and in the tub and shower. Do not use towel bars as grab bars.  Use non-skid mats or decals in the tub or shower.  If you need to sit down in the shower, use a plastic, non-slip stool.  Keep the floor dry. Clean up any water that spills on the floor as soon as it happens.  Remove soap buildup in the tub or shower regularly.  Attach bath mats securely with double-sided non-slip rug tape.  Do not have throw rugs and other things on the floor that can make you trip. What can I do in the bedroom?  Use night lights.  Make sure that you have a light by your bed that is easy to reach.  Do not use any sheets or blankets that are too big for your bed. They should not  hang down onto the floor.  Have a firm chair that has side arms. You can use this for support while you get dressed.  Do not have throw rugs and other things on the floor that can make you trip. What can I do in the kitchen?  Clean up any spills right away.  Avoid walking on wet floors.  Keep items that you use a lot in easy-to-reach places.  If you need to reach something above you, use a strong step stool that has a grab bar.  Keep electrical cords out of the way.  Do not use floor polish or wax that makes floors slippery. If you must use wax, use non-skid floor wax.  Do not have throw rugs and other things on the floor that can make you trip. What can I do with my stairs?  Do not leave any items on the stairs.  Make sure that there are handrails on both sides of the stairs and use them. Fix handrails that are broken or loose. Make sure that handrails are as long as the stairways.  Check any carpeting to  make sure that it is firmly attached to the stairs. Fix any carpet that is loose or worn.  Avoid having throw rugs at the top or bottom of the stairs. If you do have throw rugs, attach them to the floor with carpet tape.  Make sure that you have a light switch at the top of the stairs and the bottom of the stairs. If you do not have them, ask someone to add them for you. What else can I do to help prevent falls?  Wear shoes that:  Do not have high heels.  Have rubber bottoms.  Are comfortable and fit you well.  Are closed at the toe. Do not wear sandals.  If you use a stepladder:  Make sure that it is fully opened. Do not climb a closed stepladder.  Make sure that both sides of the stepladder are locked into place.  Ask someone to hold it for you, if possible.  Clearly mark and make sure that you can see:  Any grab bars or handrails.  First and last steps.  Where the edge of each step is.  Use tools that help you move around (mobility aids) if they are needed. These include:  Canes.  Walkers.  Scooters.  Crutches.  Turn on the lights when you go into a dark area. Replace any light bulbs as soon as they burn out.  Set up your furniture so you have a clear path. Avoid moving your furniture around.  If any of your floors are uneven, fix them.  If there are any pets around you, be aware of where they are.  Review your medicines with your doctor. Some medicines can make you feel dizzy. This can increase your chance of falling. Ask your doctor what other things that you can do to help prevent falls. This information is not intended to replace advice given to you by your health care provider. Make sure you discuss any questions you have with your health care provider. Document Released: 04/07/2009 Document Revised: 11/17/2015 Document Reviewed: 07/16/2014 Elsevier Interactive Patient Education  2017 [employer].

## 2020-01-15 DIAGNOSIS — D485 Neoplasm of uncertain behavior of skin: Secondary | ICD-10-CM | POA: Diagnosis not present

## 2020-01-15 DIAGNOSIS — L853 Xerosis cutis: Secondary | ICD-10-CM | POA: Diagnosis not present

## 2020-01-15 DIAGNOSIS — L280 Lichen simplex chronicus: Secondary | ICD-10-CM | POA: Diagnosis not present

## 2020-01-15 DIAGNOSIS — C44729 Squamous cell carcinoma of skin of left lower limb, including hip: Secondary | ICD-10-CM | POA: Diagnosis not present

## 2020-01-18 DIAGNOSIS — M9903 Segmental and somatic dysfunction of lumbar region: Secondary | ICD-10-CM | POA: Diagnosis not present

## 2020-01-18 DIAGNOSIS — M5134 Other intervertebral disc degeneration, thoracic region: Secondary | ICD-10-CM | POA: Diagnosis not present

## 2020-01-18 DIAGNOSIS — M5416 Radiculopathy, lumbar region: Secondary | ICD-10-CM | POA: Diagnosis not present

## 2020-01-18 DIAGNOSIS — M9902 Segmental and somatic dysfunction of thoracic region: Secondary | ICD-10-CM | POA: Diagnosis not present

## 2020-02-01 ENCOUNTER — Ambulatory Visit (INDEPENDENT_AMBULATORY_CARE_PROVIDER_SITE_OTHER): Payer: PPO | Admitting: Internal Medicine

## 2020-02-01 ENCOUNTER — Other Ambulatory Visit: Payer: Self-pay

## 2020-02-01 ENCOUNTER — Encounter: Payer: Self-pay | Admitting: Internal Medicine

## 2020-02-01 DIAGNOSIS — I10 Essential (primary) hypertension: Secondary | ICD-10-CM | POA: Diagnosis not present

## 2020-02-01 DIAGNOSIS — E7800 Pure hypercholesterolemia, unspecified: Secondary | ICD-10-CM

## 2020-02-01 DIAGNOSIS — R109 Unspecified abdominal pain: Secondary | ICD-10-CM | POA: Diagnosis not present

## 2020-02-01 DIAGNOSIS — R490 Dysphonia: Secondary | ICD-10-CM | POA: Diagnosis not present

## 2020-02-01 DIAGNOSIS — J4520 Mild intermittent asthma, uncomplicated: Secondary | ICD-10-CM | POA: Diagnosis not present

## 2020-02-01 DIAGNOSIS — M5134 Other intervertebral disc degeneration, thoracic region: Secondary | ICD-10-CM | POA: Diagnosis not present

## 2020-02-01 DIAGNOSIS — F419 Anxiety disorder, unspecified: Secondary | ICD-10-CM | POA: Diagnosis not present

## 2020-02-01 DIAGNOSIS — L989 Disorder of the skin and subcutaneous tissue, unspecified: Secondary | ICD-10-CM

## 2020-02-01 DIAGNOSIS — K219 Gastro-esophageal reflux disease without esophagitis: Secondary | ICD-10-CM

## 2020-02-01 DIAGNOSIS — M9903 Segmental and somatic dysfunction of lumbar region: Secondary | ICD-10-CM | POA: Diagnosis not present

## 2020-02-01 DIAGNOSIS — D509 Iron deficiency anemia, unspecified: Secondary | ICD-10-CM | POA: Diagnosis not present

## 2020-02-01 DIAGNOSIS — M9902 Segmental and somatic dysfunction of thoracic region: Secondary | ICD-10-CM | POA: Diagnosis not present

## 2020-02-01 DIAGNOSIS — M5416 Radiculopathy, lumbar region: Secondary | ICD-10-CM | POA: Diagnosis not present

## 2020-02-01 NOTE — Progress Notes (Signed)
Patient ID: Patricia Pacheco, female   DOB: [birth year], 79 y.o.   MRN: [id number]   Subjective:    Patient ID: Patricia Pacheco, female    DOB: 19-Jan-1941, 79 y.o.   MRN: [id number]  HPI This visit occurred during the SARS-CoV-2 public health emergency.  Safety protocols were in place, including screening questions prior to the visit, additional usage of staff PPE, and extensive cleaning of exam room while observing appropriate contact time as indicated for disinfecting solutions.  Patient here for a scheduled follow up. She recently saw Dr Marius Ditch (10/27/19) for abdominal bloating and IDA.  Has a history of chronic constipation.  Has been experiencing abdominal bloating and lower abdominal cramps. Was questioning if related to stress.  Had colonoscopy 11/15/19 - three 3-87mm polyps in the descending colon and in the ascending colon, diverticulosis and non bleeding external hemorrhoids.  EGD - small hiatal hernia.  Recommended capsule study to complete work up.  States she was started on IBGard and feels this helped.  Describes persistent intermittent abdominal pain.  Not taking IBGard now.  States will flare a few times per week.  Reports increased stress.  Increased stress with her husband's health.  Feels increased stress contributing to her GI issues.  Discussed further w/up and evaluation.  She wants to hold at this time.  Given increased stress, discussed increasing prozac. No chest pain or sob.  Does report some hoarseness.  On no blood pressure medication now.  States blood pressures in the 220U systolic range.  No cough or congestion.    Past Medical History:  Diagnosis Date  . Allergy    allergy shots (Juengel)  . Anxiety   . Arthritis   . Asthma   . CAD (coronary artery disease)    cath 11/27/04 - moderated LAD disease  . Cancer ([place])    skin  . GERD (gastroesophageal reflux disease)    EGD (05/25/04) - slight presbyesophagus, mild esophagitis, mild gastritis, mild duodenitis - positive H. pylori.   Marland Kitchen  Neuropathy    Past Surgical History:  Procedure Laterality Date  . ABDOMINAL HYSTERECTOMY    . APPENDECTOMY  1967  . COLONOSCOPY WITH PROPOFOL N/A 11/16/2019   Procedure: COLONOSCOPY WITH PROPOFOL;  Surgeon: Lin Landsman, MD;  Location: [facility] ENDOSCOPY;  Service: Gastroenterology;  Laterality: N/A;  . ESOPHAGOGASTRODUODENOSCOPY (EGD) WITH PROPOFOL N/A 11/16/2019   Procedure: ESOPHAGOGASTRODUODENOSCOPY (EGD) WITH PROPOFOL;  Surgeon: Lin Landsman, MD;  Location: [facility] ENDOSCOPY;  Service: Gastroenterology;  Laterality: N/A;   Family History  Problem Relation Age of Onset  . Arthritis Mother   . Stroke Sister   . Breast cancer Neg Hx    Social History   Socioeconomic History  . Marital status: Married    Spouse name: Not on file  . Number of children: Not on file  . Years of education: Not on file  . Highest education level: Not on file  Occupational History  . Not on file  Tobacco Use  . Smoking status: Never Smoker  . Smokeless tobacco: Never Used  Vaping Use  . Vaping Use: Never used  Substance and Sexual Activity  . Alcohol use: No    Alcohol/week: 0.0 standard drinks  . Drug use: No  . Sexual activity: Not on file  Other Topics Concern  . Not on file  Social History Narrative  . Not on file   Social Determinants of Health   Financial Resource Strain: Low Risk   . Difficulty of Paying Living  Expenses: Not hard at all  Food Insecurity:   . Worried About Charity fundraiser in the Last Year:   . Arboriculturist in the Last Year:   Transportation Needs:   . Film/video editor (Medical):   Marland Kitchen Lack of Transportation (Non-Medical):   Physical Activity:   . Days of Exercise per Week:   . Minutes of Exercise per Session:   Stress:   . Feeling of Stress :   Social Connections: Socially Integrated  . Frequency of Communication with Friends and Family: More than three times a week  . Frequency of Social Gatherings with Friends and Family: Twice a week  .  Attends Religious Services: More than 4 times per year  . Active Member of Clubs or Organizations: Yes  . Attends Archivist Meetings: Not on file  . Marital Status: Married    Outpatient Encounter Medications as of 02/01/2020  Medication Sig  . ALPRAZolam (XANAX) 0.25 MG tablet TAKE ONE TABLET EVERY DAY AS NEEDED FOR ANXIETY  . Ascorbic Acid (VITAMIN C PO) Take by mouth daily.  Marland Kitchen aspirin 81 MG tablet Take 81 mg by mouth daily.  . Calcium Carbonate-Vit D-Min (CALCIUM 1200 PO) Take by mouth daily.  . celecoxib (CELEBREX) 200 MG capsule Take 1 capsule (200 mg total) by mouth daily.  . COD LIVER OIL PO Take by mouth.  . Coenzyme Q10 (CO Q-10) 100 MG CAPS Take 1 capsule by mouth daily.  Marland Kitchen docusate sodium (COLACE) 100 MG capsule Take by mouth.  . fexofenadine (ALLEGRA) 180 MG tablet Take 1 tablet (180 mg total) by mouth daily as needed for allergies or rhinitis.  Marland Kitchen lidocaine (XYLOCAINE) 5 % ointment Apply 1 application topically as needed.  . mometasone (ELOCON) 0.1 % lotion Apply 1 application topically as directed.  . Multiple Vitamin (MULTIVITAMIN) tablet Take 1 tablet by mouth daily.  . mupirocin ointment (BACTROBAN) 2 % Place 1 application into the nose 2 (two) times daily.  . Omega-3 Fatty Acids (FISH OIL PO) Take by mouth daily.  Marland Kitchen omeprazole (PRILOSEC) 40 MG capsule TAKE 1 CAPSULE EVERY DAY  . PROAIR HFA 108 (90 Base) MCG/ACT inhaler   . Probiotic Product (PROBIOTIC PO) Take by mouth.  . silver sulfADIAZINE (SILVADENE) 1 % cream Apply 1 application topically daily.  Marland Kitchen triamcinolone cream (KENALOG) 0.1 %   . VITAMIN D PO Take by mouth daily.  . [DISCONTINUED] FLUoxetine (PROZAC) 10 MG capsule TAKE 1 CAPSULE EVERY DAY  . ezetimibe (ZETIA) 10 MG tablet TAKE ONE TABLET BY MOUTH EVERY DAY (Patient not taking: Reported on 02/01/2020)  . FLUoxetine (PROZAC) 20 MG tablet Take 1 tablet (20 mg total) by mouth daily.  . [DISCONTINUED] antiseptic oral rinse (BIOTENE) LIQD 15 mLs by Mouth  Rinse route as needed for dry mouth. (Patient not taking: Reported on 02/01/2020)  . [DISCONTINUED] BIOTIN PO Take by mouth daily. (Patient not taking: Reported on 02/01/2020)  . [DISCONTINUED] fluocinolone (SYNALAR) 0.01 % external solution Apply topically 2 (two) times daily. (Patient not taking: Reported on 02/01/2020)   No facility-administered encounter medications on file as of 02/01/2020.    Review of Systems  Constitutional: Negative for appetite change, fatigue and unexpected weight change.  HENT: Negative for congestion and sinus pressure.        Some hoarseness.   Respiratory: Negative for cough, chest tightness and shortness of breath.   Cardiovascular: Negative for chest pain, palpitations and leg swelling.  Gastrointestinal: Negative for diarrhea, nausea and  vomiting.       Abdominal discomfort and bloating as outlined.    Genitourinary: Negative for difficulty urinating and dysuria.  Musculoskeletal: Negative for joint swelling and myalgias.  Skin: Negative for color change and rash.  Neurological: Negative for dizziness, light-headedness and headaches.  Psychiatric/Behavioral: Negative for agitation and dysphoric mood.       Increased stress as outlined.         Objective:    Physical Exam Vitals reviewed.  Constitutional:      General: She is not in acute distress.    Appearance: Normal appearance.  HENT:     Head: Normocephalic and atraumatic.     Right Ear: External ear normal.     Left Ear: External ear normal.  Eyes:     General: No scleral icterus.       Right eye: No discharge.        Left eye: No discharge.     Conjunctiva/sclera: Conjunctivae normal.  Neck:     Thyroid: No thyromegaly.  Cardiovascular:     Rate and Rhythm: Normal rate and regular rhythm.  Pulmonary:     Effort: No respiratory distress.     Breath sounds: Normal breath sounds. No wheezing.  Abdominal:     General: Bowel sounds are normal.     Palpations: Abdomen is soft.      Tenderness: There is no abdominal tenderness.  Musculoskeletal:        General: No swelling or tenderness.     Cervical back: Neck supple. No tenderness.  Lymphadenopathy:     Cervical: No cervical adenopathy.  Skin:    Findings: No erythema or rash.  Neurological:     Mental Status: She is alert.  Psychiatric:        Mood and Affect: Mood normal.        Behavior: Behavior normal.     BP 132/82 (BP Location: Left Arm, Patient Position: Sitting, Cuff Size: Normal)   Pulse 78   Temp 98.7 F (37.1 C) (Oral)   Resp 16   Ht 5' 1.5" (1.562 m)   Wt 132 lb 6.4 oz (60.1 kg)   LMP 08/29/1995   SpO2 95%   BMI 24.61 kg/m  Wt Readings from Last 3 Encounters:  02/01/20 132 lb 6.4 oz (60.1 kg)  01/12/20 130 lb (59 kg)  11/16/19 130 lb (59 kg)     Lab Results  Component Value Date   WBC 5.2 08/31/2019   HGB 11.6 (L) 08/31/2019   HCT 34.7 (L) 08/31/2019   PLT 202.0 08/31/2019   GLUCOSE 117 (H) 08/31/2019   CHOL 181 08/31/2019   TRIG 179.0 (H) 08/31/2019   HDL 51.40 08/31/2019   LDLDIRECT 115.0 09/04/2012   LDLCALC 93 08/31/2019   ALT 12 08/31/2019   AST 17 08/31/2019   NA 139 08/31/2019   K 3.8 08/31/2019   CL 102 08/31/2019   CREATININE 0.92 08/31/2019   BUN 18 08/31/2019   CO2 30 08/31/2019   TSH 2.49 07/31/2019   HGBA1C 6.1 10/04/2017       Assessment & Plan:   Problem List Items Addressed This Visit    Skin lesions    S/p skin lesion - removed per dermatology - left calf.  Continue f/u with dermatology.       Iron deficiency anemia    Just evaluated by GI.  S/p EGD and colonoscopy as outlined.  Recommended capsule study.  Discussed further w/up with her today, given persistent abdominal discomfort.  She wants to hold on any further w/up at this time.  She feels symptoms related to stress.  Follow.        Relevant Orders   CBC with Differential/Platelet   Ferritin   Hypercholesterolemia    Has declined cholesterol medication.  Low cholesterol diet and  exercise.  Follow lipid panel.       Relevant Orders   Hepatic function panel   Lipid panel   Hoarseness    Persistent intermittent hoarseness as outlined.  Receiving treatment for reflux with PPI.  No upper symptoms reported.  Discussed referral to ENT.       GERD (gastroesophageal reflux disease)    No acid reflux reported.  She is on PPI.  Some  Hoarseness.  Discussed ENT evaluation.       Essential hypertension    Blood pressure as outlined.  On no medication.  Follow pressures.  Follow metabolic panel.      Relevant Orders   Basic metabolic panel   Asthma    Breathing stable.       Anxiety    On prozac.  Increased stress as outlined.  Discussed with her today.  Discussed increasing dose of prozac.  Follow.        Relevant Medications   FLUoxetine (PROZAC) 20 MG tablet   Abdominal pain    Persistent intermittent abdominal pain and bloating.  Saw GI.  Had EGD and colonoscopy as outlined.  Recommended capsule study to complete w/up for IDA.  Discussed CT scan. She declines at this time.  Wants to monitor. She feels is related to stress.  Discussed restarting IBGard.           I spent 40 minutes with the patient and more than 50% of the time was spent in consultation regarding the above.  Time spent discussed her current symptoms and concerns as well as recent treatment.  Time also spent discussed further w/up and evaluation and treatment.     Einar Pheasant, MD

## 2020-02-03 ENCOUNTER — Encounter: Payer: Self-pay | Admitting: Internal Medicine

## 2020-02-03 ENCOUNTER — Telehealth: Payer: Self-pay | Admitting: Internal Medicine

## 2020-02-03 DIAGNOSIS — R490 Dysphonia: Secondary | ICD-10-CM

## 2020-02-03 MED ORDER — FLUOXETINE HCL 20 MG PO TABS
20.0000 mg | ORAL_TABLET | Freq: Every day | ORAL | 2 refills | Status: DC
Start: 2020-02-03 — End: 2020-04-18

## 2020-02-03 NOTE — Assessment & Plan Note (Signed)
Persistent intermittent hoarseness as outlined.  Receiving treatment for reflux with PPI.  No upper symptoms reported.  Discussed referral to ENT.

## 2020-02-03 NOTE — Assessment & Plan Note (Signed)
S/p skin lesion - removed per dermatology - left calf.  Continue f/u with dermatology.

## 2020-02-03 NOTE — Assessment & Plan Note (Signed)
Blood pressure as outlined.  On no medication.  Follow pressures.  Follow metabolic panel.  

## 2020-02-03 NOTE — Assessment & Plan Note (Signed)
On prozac.  Increased stress as outlined.  Discussed with her today.  Discussed increasing dose of prozac.  Follow.

## 2020-02-03 NOTE — Assessment & Plan Note (Signed)
Persistent intermittent abdominal pain and bloating.  Saw GI.  Had EGD and colonoscopy as outlined.  Recommended capsule study to complete w/up for IDA.  Discussed CT scan. She declines at this time.  Wants to monitor. She feels is related to stress.  Discussed restarting IBGard.

## 2020-02-03 NOTE — Assessment & Plan Note (Signed)
No acid reflux reported.  She is on PPI.  Some  Hoarseness.  Discussed ENT evaluation.

## 2020-02-03 NOTE — Assessment & Plan Note (Signed)
Breathing stable.

## 2020-02-03 NOTE — Telephone Encounter (Signed)
Pt states that she was supposed to have her ALPRAZolam (XANAX) 0.25 MG tablet dosage increased and called in. She also needs her BP medication and IB-guard for her stomach? Please advise.

## 2020-02-03 NOTE — Assessment & Plan Note (Signed)
Has declined cholesterol medication.  Low cholesterol diet and exercise.  Follow lipid panel.   

## 2020-02-03 NOTE — Assessment & Plan Note (Signed)
Just evaluated by GI.  S/p EGD and colonoscopy as outlined.  Recommended capsule study.  Discussed further w/up with her today, given persistent abdominal discomfort.  She wants to hold on any further w/up at this time.  She feels symptoms related to stress.  Follow.

## 2020-02-03 NOTE — Telephone Encounter (Signed)
What we had discussed increasing was her prozac.  She was on 10mg  of prozac and could take 2 capsules per day (with the rx she has).  I sent in rx for prozac 20mg  q day.  When she gets new rx, she will take one per day. I prefer not to increase xanax.  Also, the IBGard was started by GI.  She felt it helped.  We did discuss restarting.  She can get this otc.  Let me know if a problem or needs rx.  Also, had discussed her persistent hoarseness.  Is she ok with me referring her back to ENT.  Thanks

## 2020-02-04 NOTE — Telephone Encounter (Signed)
Left message to call office no answer.

## 2020-02-05 NOTE — Telephone Encounter (Signed)
Patient would like to see Dr Marella Bile for ENT and she voiced understanding to the GI medication and to Prozac, she will take 2 10 mg tabs until she receives the Prozac 20 mg.

## 2020-02-07 NOTE — Telephone Encounter (Signed)
Order placed for ENT referral.   

## 2020-02-09 DIAGNOSIS — C44729 Squamous cell carcinoma of skin of left lower limb, including hip: Secondary | ICD-10-CM | POA: Diagnosis not present

## 2020-02-10 ENCOUNTER — Other Ambulatory Visit: Payer: PPO

## 2020-02-11 ENCOUNTER — Other Ambulatory Visit: Payer: PPO

## 2020-02-16 DIAGNOSIS — I872 Venous insufficiency (chronic) (peripheral): Secondary | ICD-10-CM | POA: Diagnosis not present

## 2020-02-17 ENCOUNTER — Ambulatory Visit: Payer: PPO | Admitting: Gastroenterology

## 2020-03-29 ENCOUNTER — Other Ambulatory Visit: Payer: Self-pay | Admitting: Internal Medicine

## 2020-04-12 ENCOUNTER — Other Ambulatory Visit (INDEPENDENT_AMBULATORY_CARE_PROVIDER_SITE_OTHER): Payer: PPO

## 2020-04-12 ENCOUNTER — Other Ambulatory Visit: Payer: Self-pay

## 2020-04-12 DIAGNOSIS — D509 Iron deficiency anemia, unspecified: Secondary | ICD-10-CM | POA: Diagnosis not present

## 2020-04-12 DIAGNOSIS — E7800 Pure hypercholesterolemia, unspecified: Secondary | ICD-10-CM

## 2020-04-12 DIAGNOSIS — I10 Essential (primary) hypertension: Secondary | ICD-10-CM | POA: Diagnosis not present

## 2020-04-12 LAB — CBC WITH DIFFERENTIAL/PLATELET
Basophils Absolute: 0 10*3/uL (ref 0.0–0.1)
Basophils Relative: 0.8 % (ref 0.0–3.0)
Eosinophils Absolute: 0.3 10*3/uL (ref 0.0–0.7)
Eosinophils Relative: 5.4 % — ABNORMAL HIGH (ref 0.0–5.0)
HCT: 34.1 % — ABNORMAL LOW (ref 36.0–46.0)
Hemoglobin: 11.3 g/dL — ABNORMAL LOW (ref 12.0–15.0)
Lymphocytes Relative: 36.1 % (ref 12.0–46.0)
Lymphs Abs: 1.8 10*3/uL (ref 0.7–4.0)
MCHC: 33 g/dL (ref 30.0–36.0)
MCV: 90.1 fl (ref 78.0–100.0)
Monocytes Absolute: 0.4 10*3/uL (ref 0.1–1.0)
Monocytes Relative: 8.3 % (ref 3.0–12.0)
Neutro Abs: 2.4 10*3/uL (ref 1.4–7.7)
Neutrophils Relative %: 49.4 % (ref 43.0–77.0)
Platelets: 201 10*3/uL (ref 150.0–400.0)
RBC: 3.79 Mil/uL — ABNORMAL LOW (ref 3.87–5.11)
RDW: 13.8 % (ref 11.5–15.5)
WBC: 4.9 10*3/uL (ref 4.0–10.5)

## 2020-04-12 LAB — LIPID PANEL
Cholesterol: 172 mg/dL (ref 0–200)
HDL: 55.7 mg/dL (ref >39.00)
LDL Cholesterol: 99 mg/dL (ref 0–99)
NonHDL: 116.42
Total CHOL/HDL Ratio: 3
Triglycerides: 86 mg/dL (ref 0.0–149.0)
VLDL: 17.2 mg/dL (ref 0.0–40.0)

## 2020-04-12 LAB — BASIC METABOLIC PANEL
BUN: 15 mg/dL (ref 6–23)
CO2: 32 mEq/L (ref 19–32)
Calcium: 9.1 mg/dL (ref 8.4–10.5)
Chloride: 104 mEq/L (ref 96–112)
Creatinine, Ser: 0.91 mg/dL (ref 0.40–1.20)
GFR: 59.33 mL/min — ABNORMAL LOW (ref >60.00)
Glucose, Bld: 89 mg/dL (ref 70–99)
Potassium: 4.4 mEq/L (ref 3.5–5.1)
Sodium: 142 mEq/L (ref 135–145)

## 2020-04-12 LAB — HEPATIC FUNCTION PANEL
ALT: 11 U/L (ref 0–35)
AST: 19 U/L (ref 0–37)
Albumin: 3.9 g/dL (ref 3.5–5.2)
Alkaline Phosphatase: 96 U/L (ref 39–117)
Bilirubin, Direct: 0.1 mg/dL (ref 0.0–0.3)
Total Bilirubin: 0.4 mg/dL (ref 0.2–1.2)
Total Protein: 6.3 g/dL (ref 6.0–8.3)

## 2020-04-12 LAB — FERRITIN: Ferritin: 31.7 ng/mL (ref 10.0–291.0)

## 2020-04-13 ENCOUNTER — Ambulatory Visit (INDEPENDENT_AMBULATORY_CARE_PROVIDER_SITE_OTHER): Payer: PPO

## 2020-04-13 ENCOUNTER — Ambulatory Visit (INDEPENDENT_AMBULATORY_CARE_PROVIDER_SITE_OTHER): Payer: PPO | Admitting: Internal Medicine

## 2020-04-13 ENCOUNTER — Other Ambulatory Visit: Payer: Self-pay

## 2020-04-13 ENCOUNTER — Ambulatory Visit: Payer: PPO | Admitting: Internal Medicine

## 2020-04-13 DIAGNOSIS — R2689 Other abnormalities of gait and mobility: Secondary | ICD-10-CM

## 2020-04-13 DIAGNOSIS — R140 Abdominal distension (gaseous): Secondary | ICD-10-CM

## 2020-04-13 DIAGNOSIS — J4520 Mild intermittent asthma, uncomplicated: Secondary | ICD-10-CM

## 2020-04-13 DIAGNOSIS — M79674 Pain in right toe(s): Secondary | ICD-10-CM

## 2020-04-13 DIAGNOSIS — K219 Gastro-esophageal reflux disease without esophagitis: Secondary | ICD-10-CM

## 2020-04-13 DIAGNOSIS — S99921A Unspecified injury of right foot, initial encounter: Secondary | ICD-10-CM | POA: Diagnosis not present

## 2020-04-13 DIAGNOSIS — R030 Elevated blood-pressure reading, without diagnosis of hypertension: Secondary | ICD-10-CM

## 2020-04-13 DIAGNOSIS — E7800 Pure hypercholesterolemia, unspecified: Secondary | ICD-10-CM

## 2020-04-13 DIAGNOSIS — D509 Iron deficiency anemia, unspecified: Secondary | ICD-10-CM | POA: Diagnosis not present

## 2020-04-13 DIAGNOSIS — F419 Anxiety disorder, unspecified: Secondary | ICD-10-CM | POA: Diagnosis not present

## 2020-04-13 DIAGNOSIS — M7989 Other specified soft tissue disorders: Secondary | ICD-10-CM | POA: Diagnosis not present

## 2020-04-13 NOTE — Progress Notes (Addendum)
Patient ID: Patricia Pacheco, female   DOB: 1940/10/13, 79 y.o.   MRN: [id number]   Subjective:    Patient ID: Patricia Pacheco, female    DOB: 09-19-1940, 79 y.o.   MRN: [id number]  HPI This visit occurred during the SARS-CoV-2 public health emergency.  Safety protocols were in place, including screening questions prior to the visit, additional usage of staff PPE, and extensive cleaning of exam room while observing appropriate contact time as indicated for disinfecting solutions.  Patient here for a scheduled follow up. Recently saw GI for abdominal bloating and IDA.  Has a history of chronic constipation.  S/p EGD and colonoscopy 5//23/21.  EGD - small hiatal hernia.  Colonoscopy 3 polyps, diverticulosis and internal hemorrhoids.  Started on IBGard and felt this helped.  Was to have started back on IBGard, but states she never did. Plans to restart.  Increased stress with husband's health issues.  Increased prozac to 20mg  last visit.  Overall handling things relatively well.  No chest pain or sob reported.  No abdominal pain or bowel change reported. She did fall recently - working in her yard.  Shoe caught and she fell forward.  Injured her right great toe and right elbow.  Right hip - hematoma.  No increased pain in her right elbow or hip.  Good rom.  Some persistent pain in her right great toe.  Discussed balance.  She is agreeable for PT - to help with gait, balance and core strengthening.     Past Medical History:  Diagnosis Date  . Allergy    allergy shots (Juengel)  . Anxiety   . Arthritis   . Asthma   . CAD (coronary artery disease)    cath 11/27/04 - moderated LAD disease  . Cancer ([place])    skin  . GERD (gastroesophageal reflux disease)    EGD (05/25/04) - slight presbyesophagus, mild esophagitis, mild gastritis, mild duodenitis - positive H. pylori.   Marland Kitchen Neuropathy    Past Surgical History:  Procedure Laterality Date  . ABDOMINAL HYSTERECTOMY    . APPENDECTOMY  1967  . COLONOSCOPY WITH  PROPOFOL N/A 11/16/2019   Procedure: COLONOSCOPY WITH PROPOFOL;  Surgeon: Lin Landsman, MD;  Location: [facility] ENDOSCOPY;  Service: Gastroenterology;  Laterality: N/A;  . ESOPHAGOGASTRODUODENOSCOPY (EGD) WITH PROPOFOL N/A 11/16/2019   Procedure: ESOPHAGOGASTRODUODENOSCOPY (EGD) WITH PROPOFOL;  Surgeon: Lin Landsman, MD;  Location: [facility] ENDOSCOPY;  Service: Gastroenterology;  Laterality: N/A;   Family History  Problem Relation Age of Onset  . Arthritis Mother   . Stroke Sister   . Breast cancer Neg Hx    Social History   Socioeconomic History  . Marital status: Married    Spouse name: Not on file  . Number of children: Not on file  . Years of education: Not on file  . Highest education level: Not on file  Occupational History  . Not on file  Tobacco Use  . Smoking status: Never Smoker  . Smokeless tobacco: Never Used  Vaping Use  . Vaping Use: Never used  Substance and Sexual Activity  . Alcohol use: No    Alcohol/week: 0.0 standard drinks  . Drug use: No  . Sexual activity: Not on file  Other Topics Concern  . Not on file  Social History Narrative  . Not on file   Social Determinants of Health   Financial Resource Strain: Low Risk   . Difficulty of Paying Living Expenses: Not hard at all  Food Insecurity:   .  Worried About Charity fundraiser in the Last Year: Not on file  . Ran Out of Food in the Last Year: Not on file  Transportation Needs:   . Lack of Transportation (Medical): Not on file  . Lack of Transportation (Non-Medical): Not on file  Physical Activity:   . Days of Exercise per Week: Not on file  . Minutes of Exercise per Session: Not on file  Stress:   . Feeling of Stress : Not on file  Social Connections: Socially Integrated  . Frequency of Communication with Friends and Family: More than three times a week  . Frequency of Social Gatherings with Friends and Family: Twice a week  . Attends Religious Services: More than 4 times per year  .  Active Member of Clubs or Organizations: Yes  . Attends Archivist Meetings: Not on file  . Marital Status: Married    Outpatient Encounter Medications as of 04/13/2020  Medication Sig  . ALPRAZolam (XANAX) 0.25 MG tablet TAKE ONE TABLET EVERY DAY AS NEEDED FOR ANXIETY  . Ascorbic Acid (VITAMIN C PO) Take by mouth daily.  Marland Kitchen aspirin 81 MG tablet Take 81 mg by mouth daily.  . Calcium Carbonate-Vit D-Min (CALCIUM 1200 PO) Take by mouth daily.  . celecoxib (CELEBREX) 200 MG capsule Take 1 capsule (200 mg total) by mouth daily.  . COD LIVER OIL PO Take by mouth.  . Coenzyme Q10 (CO Q-10) 100 MG CAPS Take 1 capsule by mouth daily.  Marland Kitchen docusate sodium (COLACE) 100 MG capsule Take by mouth.  . ezetimibe (ZETIA) 10 MG tablet TAKE ONE TABLET BY MOUTH EVERY DAY (Patient not taking: Reported on 02/01/2020)  . fexofenadine (ALLEGRA) 180 MG tablet Take 1 tablet (180 mg total) by mouth daily as needed for allergies or rhinitis.  Marland Kitchen lidocaine (XYLOCAINE) 5 % ointment Apply 1 application topically as needed.  . mometasone (ELOCON) 0.1 % lotion Apply 1 application topically as directed.  . Multiple Vitamin (MULTIVITAMIN) tablet Take 1 tablet by mouth daily.  . mupirocin ointment (BACTROBAN) 2 % Place 1 application into the nose 2 (two) times daily.  . Omega-3 Fatty Acids (FISH OIL PO) Take by mouth daily.  Marland Kitchen omeprazole (PRILOSEC) 40 MG capsule TAKE 1 CAPSULE EVERY DAY  . PROAIR HFA 108 (90 Base) MCG/ACT inhaler   . Probiotic Product (PROBIOTIC PO) Take by mouth.  . silver sulfADIAZINE (SILVADENE) 1 % cream Apply 1 application topically daily.  Marland Kitchen triamcinolone cream (KENALOG) 0.1 %   . VITAMIN D PO Take by mouth daily.  . [DISCONTINUED] FLUoxetine (PROZAC) 20 MG tablet Take 1 tablet (20 mg total) by mouth daily.   No facility-administered encounter medications on file as of 04/13/2020.    Review of Systems  Constitutional: Negative for appetite change and unexpected weight change.  HENT:  Negative for congestion and sinus pressure.   Respiratory: Negative for cough, chest tightness and shortness of breath.   Cardiovascular: Negative for chest pain, palpitations and leg swelling.  Gastrointestinal: Negative for abdominal pain, diarrhea, nausea and vomiting.  Genitourinary: Negative for difficulty urinating and dysuria.  Musculoskeletal: Negative for joint swelling and myalgias.       Pain - great toe.  S/p injury  Skin: Negative for color change and rash.  Neurological: Negative for dizziness, light-headedness and headaches.  Psychiatric/Behavioral: Negative for agitation and dysphoric mood.       Objective:    Physical Exam Vitals reviewed.  Constitutional:      General: She is  not in acute distress.    Appearance: Normal appearance.  HENT:     Head: Normocephalic and atraumatic.     Right Ear: External ear normal.     Left Ear: External ear normal.  Eyes:     General: No scleral icterus.       Right eye: No discharge.        Left eye: No discharge.     Conjunctiva/sclera: Conjunctivae normal.  Neck:     Thyroid: No thyromegaly.  Cardiovascular:     Rate and Rhythm: Normal rate and regular rhythm.  Pulmonary:     Effort: No respiratory distress.     Breath sounds: Normal breath sounds. No wheezing.  Abdominal:     General: Bowel sounds are normal.     Palpations: Abdomen is soft.     Tenderness: There is no abdominal tenderness.  Musculoskeletal:        General: No swelling or tenderness.     Cervical back: Neck supple. No tenderness.     Comments: Pain - right great toe - to palpation.  No increased swelling or redness.    Lymphadenopathy:     Cervical: No cervical adenopathy.  Skin:    Findings: No erythema or rash.  Neurological:     Mental Status: She is alert.  Psychiatric:        Mood and Affect: Mood normal.        Behavior: Behavior normal.     BP 140/88   Pulse 84   Temp 98.2 F (36.8 C) (Oral)   Resp 16   Ht 5' 0.5" (1.537 m)    Wt 130 lb 6.4 oz (59.1 kg)   LMP 08/29/1995   SpO2 99%   BMI 25.05 kg/m  Wt Readings from Last 3 Encounters:  04/13/20 130 lb 6.4 oz (59.1 kg)  02/01/20 132 lb 6.4 oz (60.1 kg)  01/12/20 130 lb (59 kg)     Lab Results  Component Value Date   WBC 4.9 04/12/2020   HGB 11.3 (L) 04/12/2020   HCT 34.1 (L) 04/12/2020   PLT 201.0 04/12/2020   GLUCOSE 89 04/12/2020   CHOL 172 04/12/2020   TRIG 86.0 04/12/2020   HDL 55.70 04/12/2020   LDLDIRECT 115.0 09/04/2012   LDLCALC 99 04/12/2020   ALT 11 04/12/2020   AST 19 04/12/2020   NA 142 04/12/2020   K 4.4 04/12/2020   CL 104 04/12/2020   CREATININE 0.91 04/12/2020   BUN 15 04/12/2020   CO2 32 04/12/2020   TSH 2.49 07/31/2019   HGBA1C 6.1 10/04/2017       Assessment & Plan:   Problem List Items Addressed This Visit    Toe pain, right    Recent injury. Persistent pain.  Check xray.  Follow.       Relevant Orders   DG Toe Great Right (Completed)   Iron deficiency anemia    Saw GI.  S/p EGD and colonoscopy as outlined.  Previously discussed f/u with GI for capsule study.  Wanted to monitor.  Will restart IBGard.  Symptoms appear to have improved.  Follow.       Hypercholesterolemia    Discussed labs.  Discussed calculated cholesterol risk.  Discussed recommendation to start cholesterol medication.  She declines.  Low cholesterol diet and exercise.  Follow lipid panel.       GERD (gastroesophageal reflux disease)    On omeprazole.  Overall stable.  Follow.       Elevated blood pressure  reading    Blood pressure elevated today.  Better on recheck.  Have her spot check her pressure.  Follow.  Hold on additional medication.  Follow metabolic panel.       Balance problem    Recent fall.  Her shoe caught.  On questioning, concern regarding balance and gait. Discussed PT to evaluate and treat for balance, gait and work on core strengthening.        Relevant Orders   Ambulatory referral to Physical Therapy   Asthma     Breathing stable.        Anxiety    On prozac 20mg  q day now.  Appears to be doing better.  Follow.       Abdominal bloating    Recently worked up and evaluated by GI as outlined.  S/p EGD and colonoscopy.  Was started on IBGard. Helped.  Not taking now.  Will restart.  Keep f/u with GI.           Einar Pheasant, MD

## 2020-04-15 ENCOUNTER — Other Ambulatory Visit: Payer: Self-pay

## 2020-04-15 ENCOUNTER — Telehealth: Payer: Self-pay | Admitting: Internal Medicine

## 2020-04-15 NOTE — Telephone Encounter (Signed)
CD placed up front for pickup at 3pm. Pt notified by Jeanne Ivan.

## 2020-04-15 NOTE — Telephone Encounter (Signed)
Patient is coming by to pick up her x-rays today 04-15-20

## 2020-04-18 ENCOUNTER — Other Ambulatory Visit: Payer: Self-pay

## 2020-04-18 ENCOUNTER — Ambulatory Visit: Payer: PPO

## 2020-04-18 ENCOUNTER — Encounter: Payer: Self-pay | Admitting: Podiatry

## 2020-04-18 ENCOUNTER — Ambulatory Visit: Payer: PPO | Admitting: Podiatry

## 2020-04-18 DIAGNOSIS — S90111A Contusion of right great toe without damage to nail, initial encounter: Secondary | ICD-10-CM | POA: Diagnosis not present

## 2020-04-18 DIAGNOSIS — S92404A Nondisplaced unspecified fracture of right great toe, initial encounter for closed fracture: Secondary | ICD-10-CM

## 2020-04-18 NOTE — Progress Notes (Signed)
She presents today after having not seen her since January with a chief concern of a painful hallux right she states that she fell about a week and a half ago as she was trying to water some flowers.  She had x-rays made by her primary care provider the toe has been red and swollen and tender and she has been placing ice on it.  Objective: Vital signs are stable she is alert and oriented x3.  Pulses are palpable.  Hallux right is red and swollen at the level of the hallux interphalangeal joint.  Review of the radiographs demonstrates a medial fracture line near the base of the distal phalanx of the hallux right appears to be nondisplaced noncomminuted.  Examination does not demonstrate any bruising no lifting of the nail plate.  Assessment: Fracture distal phalanx hallux right.  Plan: Demonstrated to her how to wrap the toe with Coban.  I also wanted to place her in a Darco shoe but she refused.  Follow-up with me in 4 to 6 weeks for another set of x-rays unless doing much better

## 2020-04-24 ENCOUNTER — Encounter: Payer: Self-pay | Admitting: Internal Medicine

## 2020-04-24 DIAGNOSIS — R2689 Other abnormalities of gait and mobility: Secondary | ICD-10-CM | POA: Insufficient documentation

## 2020-04-24 NOTE — Assessment & Plan Note (Signed)
Saw GI.  S/p EGD and colonoscopy as outlined.  Previously discussed f/u with GI for capsule study.  Wanted to monitor.  Will restart IBGard.  Symptoms appear to have improved.  Follow.

## 2020-04-24 NOTE — Assessment & Plan Note (Signed)
Recently worked up and evaluated by GI as outlined.  S/p EGD and colonoscopy.  Was started on IBGard. Helped.  Not taking now.  Will restart.  Keep f/u with GI.

## 2020-04-24 NOTE — Assessment & Plan Note (Signed)
Blood pressure elevated today.  Better on recheck.  Have her spot check her pressure.  Follow.  Hold on additional medication.  Follow metabolic panel.

## 2020-04-24 NOTE — Addendum Note (Signed)
Addended by: Alisa Graff on: 04/24/2020 07:02 AM   Modules accepted: Orders

## 2020-04-24 NOTE — Assessment & Plan Note (Signed)
Discussed labs.  Discussed calculated cholesterol risk.  Discussed recommendation to start cholesterol medication.  She declines.  Low cholesterol diet and exercise.  Follow lipid panel.

## 2020-04-24 NOTE — Assessment & Plan Note (Signed)
On omeprazole.  Overall stable.  Follow.

## 2020-04-24 NOTE — Assessment & Plan Note (Signed)
Recent fall.  Her shoe caught.  On questioning, concern regarding balance and gait. Discussed PT to evaluate and treat for balance, gait and work on core strengthening.

## 2020-04-24 NOTE — Assessment & Plan Note (Signed)
On prozac 20mg  q day now.  Appears to be doing better.  Follow.

## 2020-04-24 NOTE — Assessment & Plan Note (Signed)
Breathing stable.

## 2020-04-24 NOTE — Assessment & Plan Note (Signed)
Recent injury. Persistent pain.  Check xray.  Follow.

## 2020-05-04 DIAGNOSIS — M9903 Segmental and somatic dysfunction of lumbar region: Secondary | ICD-10-CM | POA: Diagnosis not present

## 2020-05-04 DIAGNOSIS — M5416 Radiculopathy, lumbar region: Secondary | ICD-10-CM | POA: Diagnosis not present

## 2020-05-04 DIAGNOSIS — M9902 Segmental and somatic dysfunction of thoracic region: Secondary | ICD-10-CM | POA: Diagnosis not present

## 2020-05-04 DIAGNOSIS — M5134 Other intervertebral disc degeneration, thoracic region: Secondary | ICD-10-CM | POA: Diagnosis not present

## 2020-05-16 DIAGNOSIS — M5134 Other intervertebral disc degeneration, thoracic region: Secondary | ICD-10-CM | POA: Diagnosis not present

## 2020-05-16 DIAGNOSIS — M5416 Radiculopathy, lumbar region: Secondary | ICD-10-CM | POA: Diagnosis not present

## 2020-05-16 DIAGNOSIS — M9903 Segmental and somatic dysfunction of lumbar region: Secondary | ICD-10-CM | POA: Diagnosis not present

## 2020-05-16 DIAGNOSIS — M9902 Segmental and somatic dysfunction of thoracic region: Secondary | ICD-10-CM | POA: Diagnosis not present

## 2020-06-01 ENCOUNTER — Other Ambulatory Visit: Payer: Self-pay | Admitting: Internal Medicine

## 2020-06-01 DIAGNOSIS — M9902 Segmental and somatic dysfunction of thoracic region: Secondary | ICD-10-CM | POA: Diagnosis not present

## 2020-06-01 DIAGNOSIS — M9903 Segmental and somatic dysfunction of lumbar region: Secondary | ICD-10-CM | POA: Diagnosis not present

## 2020-06-01 DIAGNOSIS — M5134 Other intervertebral disc degeneration, thoracic region: Secondary | ICD-10-CM | POA: Diagnosis not present

## 2020-06-01 DIAGNOSIS — M5416 Radiculopathy, lumbar region: Secondary | ICD-10-CM | POA: Diagnosis not present

## 2020-06-16 ENCOUNTER — Other Ambulatory Visit: Payer: Self-pay

## 2020-06-16 ENCOUNTER — Ambulatory Visit (INDEPENDENT_AMBULATORY_CARE_PROVIDER_SITE_OTHER): Payer: PPO | Admitting: Internal Medicine

## 2020-06-16 VITALS — BP 148/76 | HR 80 | Temp 98.3°F | Resp 16 | Ht 65.0 in | Wt 129.0 lb

## 2020-06-16 DIAGNOSIS — Z0000 Encounter for general adult medical examination without abnormal findings: Secondary | ICD-10-CM | POA: Diagnosis not present

## 2020-06-16 DIAGNOSIS — K219 Gastro-esophageal reflux disease without esophagitis: Secondary | ICD-10-CM

## 2020-06-16 DIAGNOSIS — F419 Anxiety disorder, unspecified: Secondary | ICD-10-CM

## 2020-06-16 DIAGNOSIS — J4520 Mild intermittent asthma, uncomplicated: Secondary | ICD-10-CM | POA: Diagnosis not present

## 2020-06-16 DIAGNOSIS — D509 Iron deficiency anemia, unspecified: Secondary | ICD-10-CM

## 2020-06-16 DIAGNOSIS — I10 Essential (primary) hypertension: Secondary | ICD-10-CM | POA: Diagnosis not present

## 2020-06-16 DIAGNOSIS — E7800 Pure hypercholesterolemia, unspecified: Secondary | ICD-10-CM

## 2020-06-16 MED ORDER — HYDROCHLOROTHIAZIDE 12.5 MG PO CAPS: 12.5000 mg | ORAL_CAPSULE | Freq: Every day | ORAL | 2 refills | Status: DC

## 2020-06-16 NOTE — Progress Notes (Signed)
Patient ID: Patricia Pacheco, female   DOB: 09/20/1940, 79 y.o.   MRN: [id number]   Subjective:    Patient ID: Patricia Pacheco, female    DOB: 1941-01-23, 79 y.o.   MRN: [id number]  HPI This visit occurred during the SARS-CoV-2 public health emergency.  Safety protocols were in place, including screening questions prior to the visit, additional usage of staff PPE, and extensive cleaning of exam room while observing appropriate contact time as indicated for disinfecting solutions.  Patient here for her physical exam.  She reports increased stress due to husband's health issues.  Son has moved in and is helping out.  Discussed.  Overall she feels she is handling things relatively well.  Does not feel needs any further intervention.  Tries to stay active.  No chest pain or sob reported.  No abdominal pain or bowel change reported.  Blood pressure elevated.  On no medication now.  Has had problems with multiple blood pressure medications.  Having problems with her feet - seeing Dr Milinda Pointer.    Past Medical History:  Diagnosis Date  . Allergy    allergy shots (Juengel)  . Anxiety   . Arthritis   . Asthma   . CAD (coronary artery disease)    cath 11/27/04 - moderated LAD disease  . Cancer ([place])    skin  . GERD (gastroesophageal reflux disease)    EGD (05/25/04) - slight presbyesophagus, mild esophagitis, mild gastritis, mild duodenitis - positive H. pylori.   Marland Kitchen Neuropathy    Past Surgical History:  Procedure Laterality Date  . ABDOMINAL HYSTERECTOMY    . APPENDECTOMY  1967  . COLONOSCOPY WITH PROPOFOL N/A 11/16/2019   Procedure: COLONOSCOPY WITH PROPOFOL;  Surgeon: Lin Landsman, MD;  Location: [facility] ENDOSCOPY;  Service: Gastroenterology;  Laterality: N/A;  . ESOPHAGOGASTRODUODENOSCOPY (EGD) WITH PROPOFOL N/A 11/16/2019   Procedure: ESOPHAGOGASTRODUODENOSCOPY (EGD) WITH PROPOFOL;  Surgeon: Lin Landsman, MD;  Location: [facility] ENDOSCOPY;  Service: Gastroenterology;  Laterality: N/A;   Family  History  Problem Relation Age of Onset  . Arthritis Mother   . Stroke Sister   . Breast cancer Neg Hx    Social History   Socioeconomic History  . Marital status: Married    Spouse name: Not on file  . Number of children: Not on file  . Years of education: Not on file  . Highest education level: Not on file  Occupational History  . Not on file  Tobacco Use  . Smoking status: Never Smoker  . Smokeless tobacco: Never Used  Vaping Use  . Vaping Use: Never used  Substance and Sexual Activity  . Alcohol use: No    Alcohol/week: 0.0 standard drinks  . Drug use: No  . Sexual activity: Not on file  Other Topics Concern  . Not on file  Social History Narrative  . Not on file   Social Determinants of Health   Financial Resource Strain: Low Risk   . Difficulty of Paying Living Expenses: Not hard at all  Food Insecurity: Not on file  Transportation Needs: Not on file  Physical Activity: Not on file  Stress: Not on file  Social Connections: Socially Integrated  . Frequency of Communication with Friends and Family: More than three times a week  . Frequency of Social Gatherings with Friends and Family: Twice a week  . Attends Religious Services: More than 4 times per year  . Active Member of Clubs or Organizations: Yes  . Attends Archivist Meetings:  Not on file  . Marital Status: Married    Outpatient Encounter Medications as of 06/16/2020  Medication Sig  . ALPRAZolam (XANAX) 0.25 MG tablet TAKE ONE TABLET EVERY DAY AS NEEDED FOR ANXIETY  . Ascorbic Acid (VITAMIN C PO) Take by mouth daily.  Marland Kitchen aspirin 81 MG tablet Take 81 mg by mouth daily.  . Calcium Carbonate-Vit D-Min (CALCIUM 1200 PO) Take by mouth daily.  . celecoxib (CELEBREX) 200 MG capsule Take 1 capsule (200 mg total) by mouth daily.  . COD LIVER OIL PO Take by mouth.  . Coenzyme Q10 (CO Q-10) 100 MG CAPS Take 1 capsule by mouth daily.  Marland Kitchen docusate sodium (COLACE) 100 MG capsule Take by mouth.  .  ezetimibe (ZETIA) 10 MG tablet TAKE ONE TABLET BY MOUTH EVERY DAY  . fexofenadine (ALLEGRA) 180 MG tablet Take 1 tablet (180 mg total) by mouth daily as needed for allergies or rhinitis.  Marland Kitchen FLUoxetine (PROZAC) 20 MG capsule TAKE 1 CAPSULE BY MOUTH ONCE DAILY  . lidocaine (XYLOCAINE) 5 % ointment Apply 1 application topically as needed.  . mometasone (ELOCON) 0.1 % lotion Apply 1 application topically as directed.  . Multiple Vitamin (MULTIVITAMIN) tablet Take 1 tablet by mouth daily.  . mupirocin ointment (BACTROBAN) 2 % Place 1 application into the nose 2 (two) times daily.  . Omega-3 Fatty Acids (FISH OIL PO) Take by mouth daily.  Marland Kitchen omeprazole (PRILOSEC) 40 MG capsule TAKE 1 CAPSULE EVERY DAY  . PROAIR HFA 108 (90 Base) MCG/ACT inhaler   . Probiotic Product (PROBIOTIC PO) Take by mouth.  . silver sulfADIAZINE (SILVADENE) 1 % cream Apply 1 application topically daily.  Marland Kitchen triamcinolone cream (KENALOG) 0.1 %   . VITAMIN D PO Take by mouth daily.  . hydrochlorothiazide (MICROZIDE) 12.5 MG capsule Take 1 capsule (12.5 mg total) by mouth daily.   No facility-administered encounter medications on file as of 06/16/2020.    Review of Systems  Constitutional: Negative for appetite change and unexpected weight change.  HENT: Negative for congestion, sinus pressure and sore throat.   Eyes: Negative for pain and visual disturbance.  Respiratory: Negative for cough, chest tightness and shortness of breath.   Cardiovascular: Negative for chest pain, palpitations and leg swelling.  Gastrointestinal: Negative for abdominal pain, diarrhea, nausea and vomiting.  Genitourinary: Negative for difficulty urinating and dysuria.  Musculoskeletal: Negative for joint swelling and myalgias.  Skin: Negative for color change and rash.  Neurological: Negative for dizziness, light-headedness and headaches.  Hematological: Negative for adenopathy. Does not bruise/bleed easily.  Psychiatric/Behavioral: Negative for  agitation and dysphoric mood.       Objective:    Physical Exam Vitals reviewed.  Constitutional:      General: She is not in acute distress.    Appearance: Normal appearance. She is well-developed and well-nourished.  HENT:     Head: Normocephalic and atraumatic.     Right Ear: External ear normal.     Left Ear: External ear normal.     Mouth/Throat:     Mouth: Oropharynx is clear and moist.  Eyes:     General: No scleral icterus.       Right eye: No discharge.        Left eye: No discharge.     Conjunctiva/sclera: Conjunctivae normal.  Neck:     Thyroid: No thyromegaly.  Cardiovascular:     Rate and Rhythm: Normal rate and regular rhythm.  Pulmonary:     Effort: No tachypnea, accessory muscle usage  or respiratory distress.     Breath sounds: Normal breath sounds. No decreased breath sounds or wheezing.  Chest:  Breasts:     Right: No inverted nipple, mass, nipple discharge or tenderness (no axillary adenopathy).     Left: No inverted nipple, mass, nipple discharge or tenderness (no axilarry adenopathy).    Abdominal:     General: Bowel sounds are normal.     Palpations: Abdomen is soft.     Tenderness: There is no abdominal tenderness.  Musculoskeletal:        General: No swelling, tenderness or edema.     Cervical back: Neck supple. No tenderness.  Lymphadenopathy:     Cervical: No cervical adenopathy.  Skin:    Findings: No erythema or rash.  Neurological:     Mental Status: She is alert and oriented to person, place, and time.  Psychiatric:        Mood and Affect: Mood and affect and mood normal.        Behavior: Behavior normal.     BP (!) 148/76   Pulse 80   Temp 98.3 F (36.8 C) (Oral)   Resp 16   Wt 129 lb (58.5 kg)   LMP 08/29/1995   SpO2 98%   BMI 24.78 kg/m  Wt Readings from Last 3 Encounters:  06/16/20 129 lb (58.5 kg)  04/13/20 130 lb 6.4 oz (59.1 kg)  02/01/20 132 lb 6.4 oz (60.1 kg)     Lab Results  Component Value Date   WBC  4.9 04/12/2020   HGB 11.3 (L) 04/12/2020   HCT 34.1 (L) 04/12/2020   PLT 201.0 04/12/2020   GLUCOSE 89 04/12/2020   CHOL 172 04/12/2020   TRIG 86.0 04/12/2020   HDL 55.70 04/12/2020   LDLDIRECT 115.0 09/04/2012   LDLCALC 99 04/12/2020   ALT 11 04/12/2020   AST 19 04/12/2020   NA 142 04/12/2020   K 4.4 04/12/2020   CL 104 04/12/2020   CREATININE 0.91 04/12/2020   BUN 15 04/12/2020   CO2 32 04/12/2020   TSH 2.49 07/31/2019   HGBA1C 6.1 10/04/2017       Assessment & Plan:   Problem List Items Addressed This Visit    Hypercholesterolemia    Have discussed calculated cholesterol risk.  Discussed recommendation to start cholesterol medication.  She declines.  Low cholesterol diet and exercise.  Follow lipid panel.       Relevant Medications   hydrochlorothiazide (MICROZIDE) 12.5 MG capsule   Other Relevant Orders   Hepatic function panel   Lipid panel   GERD (gastroesophageal reflux disease)    No upper symptoms reported. On omeprazole.       Anxiety    Increased stress as outlined.  Son has moved in.  On prozac.  Does not feel needs anything more.  Follow.        Health care maintenance    Physical today 06/16/20.  Mammogram 09/28/19 - birads I.  Colonoscopy 10/2019 - f/u in 3 years.        Essential hypertension    Blood pressure elevated.  Has had problems with multiple blood pressure medications.  Will restart hctz 12.5mg  q day.  Follow pressures.  Get her back in soon to reassess.  Follow metabolic panel.       Relevant Medications   hydrochlorothiazide (MICROZIDE) 12.5 MG capsule   Other Relevant Orders   TSH   Basic metabolic panel   Asthma    Breathing stable.  Iron deficiency anemia    S/p EGD and colonoscopy.  Previously discussed capsule study.  Wanted to monitor.  Follow cbc and iron studies.       Relevant Orders   CBC with Differential/Platelet   IBC + Ferritin       Einar Pheasant, MD

## 2020-06-16 NOTE — Assessment & Plan Note (Signed)
Physical today 06/16/20.  Mammogram 09/28/19 - birads I.  Colonoscopy 10/2019 - f/u in 3 years.

## 2020-06-22 DIAGNOSIS — M5416 Radiculopathy, lumbar region: Secondary | ICD-10-CM | POA: Diagnosis not present

## 2020-06-22 DIAGNOSIS — M5134 Other intervertebral disc degeneration, thoracic region: Secondary | ICD-10-CM | POA: Diagnosis not present

## 2020-06-22 DIAGNOSIS — M9903 Segmental and somatic dysfunction of lumbar region: Secondary | ICD-10-CM | POA: Diagnosis not present

## 2020-06-22 DIAGNOSIS — M9902 Segmental and somatic dysfunction of thoracic region: Secondary | ICD-10-CM | POA: Diagnosis not present

## 2020-06-23 ENCOUNTER — Encounter: Payer: Self-pay | Admitting: Internal Medicine

## 2020-06-23 NOTE — Assessment & Plan Note (Signed)
Blood pressure elevated.  Has had problems with multiple blood pressure medications.  Will restart hctz 12.5mg  q day.  Follow pressures.  Get her back in soon to reassess.  Follow metabolic panel.

## 2020-06-23 NOTE — Assessment & Plan Note (Signed)
Have discussed calculated cholesterol risk.  Discussed recommendation to start cholesterol medication. She declines.  Low cholesterol diet and exercise.  Follow lipid panel.  

## 2020-06-23 NOTE — Assessment & Plan Note (Signed)
S/p EGD and colonoscopy.  Previously discussed capsule study.  Wanted to monitor.  Follow cbc and iron studies.  

## 2020-06-23 NOTE — Assessment & Plan Note (Signed)
No upper symptoms reported.  On omeprazole.  

## 2020-06-23 NOTE — Assessment & Plan Note (Signed)
Breathing stable.

## 2020-06-23 NOTE — Assessment & Plan Note (Signed)
Increased stress as outlined.  Son has moved in.  On prozac.  Does not feel needs anything more.  Follow.

## 2020-07-13 DIAGNOSIS — M5416 Radiculopathy, lumbar region: Secondary | ICD-10-CM | POA: Diagnosis not present

## 2020-07-13 DIAGNOSIS — M5134 Other intervertebral disc degeneration, thoracic region: Secondary | ICD-10-CM | POA: Diagnosis not present

## 2020-07-13 DIAGNOSIS — M9903 Segmental and somatic dysfunction of lumbar region: Secondary | ICD-10-CM | POA: Diagnosis not present

## 2020-07-13 DIAGNOSIS — M9902 Segmental and somatic dysfunction of thoracic region: Secondary | ICD-10-CM | POA: Diagnosis not present

## 2020-07-28 ENCOUNTER — Telehealth: Payer: Self-pay

## 2020-07-28 MED ORDER — FLUOXETINE HCL 20 MG PO CAPS: 20.0000 mg | ORAL_CAPSULE | Freq: Every day | ORAL | 1 refills | Status: DC

## 2020-07-28 NOTE — Telephone Encounter (Signed)
Pt needs refill on FLUoxetine (PROZAC) 20 MG capsule and omeprazole (PRILOSEC) 40 MG capsule

## 2020-08-03 DIAGNOSIS — M9902 Segmental and somatic dysfunction of thoracic region: Secondary | ICD-10-CM | POA: Diagnosis not present

## 2020-08-03 DIAGNOSIS — M5134 Other intervertebral disc degeneration, thoracic region: Secondary | ICD-10-CM | POA: Diagnosis not present

## 2020-08-03 DIAGNOSIS — M5416 Radiculopathy, lumbar region: Secondary | ICD-10-CM | POA: Diagnosis not present

## 2020-08-03 DIAGNOSIS — M9903 Segmental and somatic dysfunction of lumbar region: Secondary | ICD-10-CM | POA: Diagnosis not present

## 2020-08-04 ENCOUNTER — Encounter: Payer: Self-pay | Admitting: Internal Medicine

## 2020-08-04 ENCOUNTER — Ambulatory Visit (INDEPENDENT_AMBULATORY_CARE_PROVIDER_SITE_OTHER): Payer: Medicare Other | Admitting: Internal Medicine

## 2020-08-04 ENCOUNTER — Other Ambulatory Visit: Payer: Self-pay

## 2020-08-04 DIAGNOSIS — R2689 Other abnormalities of gait and mobility: Secondary | ICD-10-CM

## 2020-08-04 DIAGNOSIS — J4520 Mild intermittent asthma, uncomplicated: Secondary | ICD-10-CM | POA: Diagnosis not present

## 2020-08-04 DIAGNOSIS — I10 Essential (primary) hypertension: Secondary | ICD-10-CM

## 2020-08-04 DIAGNOSIS — D509 Iron deficiency anemia, unspecified: Secondary | ICD-10-CM | POA: Diagnosis not present

## 2020-08-04 DIAGNOSIS — E7800 Pure hypercholesterolemia, unspecified: Secondary | ICD-10-CM | POA: Diagnosis not present

## 2020-08-04 DIAGNOSIS — M25551 Pain in right hip: Secondary | ICD-10-CM

## 2020-08-04 DIAGNOSIS — K219 Gastro-esophageal reflux disease without esophagitis: Secondary | ICD-10-CM

## 2020-08-04 DIAGNOSIS — F419 Anxiety disorder, unspecified: Secondary | ICD-10-CM

## 2020-08-04 LAB — CBC WITH DIFFERENTIAL/PLATELET
Basophils Absolute: 0 10*3/uL (ref 0.0–0.1)
Basophils Relative: 0.9 % (ref 0.0–3.0)
Eosinophils Absolute: 0.1 10*3/uL (ref 0.0–0.7)
Eosinophils Relative: 2.6 % (ref 0.0–5.0)
HCT: 36.7 % (ref 36.0–46.0)
Hemoglobin: 12.2 g/dL (ref 12.0–15.0)
Lymphocytes Relative: 42.9 % (ref 12.0–46.0)
Lymphs Abs: 2.1 10*3/uL (ref 0.7–4.0)
MCHC: 33.2 g/dL (ref 30.0–36.0)
MCV: 89.1 fl (ref 78.0–100.0)
Monocytes Absolute: 0.3 10*3/uL (ref 0.1–1.0)
Monocytes Relative: 6 % (ref 3.0–12.0)
Neutro Abs: 2.4 10*3/uL (ref 1.4–7.7)
Neutrophils Relative %: 47.6 % (ref 43.0–77.0)
Platelets: 191 10*3/uL (ref 150.0–400.0)
RBC: 4.12 Mil/uL (ref 3.87–5.11)
RDW: 13.4 % (ref 11.5–15.5)
WBC: 5 10*3/uL (ref 4.0–10.5)

## 2020-08-04 LAB — IBC + FERRITIN
Ferritin: 31.3 ng/mL (ref 10.0–291.0)
Iron: 120 ug/dL (ref 42–145)
Saturation Ratios: 39 % (ref 20.0–50.0)
Transferrin: 220 mg/dL (ref 212.0–360.0)

## 2020-08-04 LAB — BASIC METABOLIC PANEL
BUN: 21 mg/dL (ref 6–23)
CO2: 32 mEq/L (ref 19–32)
Calcium: 9.4 mg/dL (ref 8.4–10.5)
Chloride: 100 mEq/L (ref 96–112)
Creatinine, Ser: 1.01 mg/dL (ref 0.40–1.20)
GFR: 52.42 mL/min — ABNORMAL LOW (ref >60.00)
Glucose, Bld: 76 mg/dL (ref 70–99)
Potassium: 4 mEq/L (ref 3.5–5.1)
Sodium: 140 mEq/L (ref 135–145)

## 2020-08-04 LAB — LIPID PANEL
Cholesterol: 203 mg/dL — ABNORMAL HIGH (ref 0–200)
HDL: 62.2 mg/dL (ref >39.00)
LDL Cholesterol: 119 mg/dL — ABNORMAL HIGH (ref 0–99)
NonHDL: 140.95
Total CHOL/HDL Ratio: 3
Triglycerides: 108 mg/dL (ref 0.0–149.0)
VLDL: 21.6 mg/dL (ref 0.0–40.0)

## 2020-08-04 LAB — HEPATIC FUNCTION PANEL
ALT: 11 U/L (ref 0–35)
AST: 16 U/L (ref 0–37)
Albumin: 4.3 g/dL (ref 3.5–5.2)
Alkaline Phosphatase: 87 U/L (ref 39–117)
Bilirubin, Direct: 0.1 mg/dL (ref 0.0–0.3)
Total Bilirubin: 0.4 mg/dL (ref 0.2–1.2)
Total Protein: 6.9 g/dL (ref 6.0–8.3)

## 2020-08-04 LAB — TSH: TSH: 2.04 u[IU]/mL (ref 0.35–4.50)

## 2020-08-04 NOTE — Progress Notes (Signed)
Patient ID: Patricia Pacheco, female   DOB: [birth year], 80 y.o.   MRN: [id number]   Subjective:    Patient ID: Patricia Pacheco, female    DOB: 1940-08-12, 80 y.o.   MRN: [id number]  HPI This visit occurred during the SARS-CoV-2 public health emergency.  Safety protocols were in place, including screening questions prior to the visit, additional usage of staff PPE, and extensive cleaning of exam room while observing appropriate contact time as indicated for disinfecting solutions.  Patient here for a scheduled follow up.  She is here to follow up regarding her blood pressure. She has stopped taking hctz.  States could not tolerate the medication.  We have tried multiple medications, including amlodipine (swelling), hydralazine (intolerance), avapro (intolerance), lisinopril, losartan (diarrhea and swelling), benicar (intolerance) and now hctz.  State sher blood pressure has been averaging systolics in the 662-947 range.  Desires not to take any medication at this time.  No headache or dizziness.  Increased stress.  Discussed.  Overall she feels she is handling things relatively well.  No chest pain or sob reported.  No abdominal pain.  Right lateral hip - sore - with pressure.  No increased pain with walking.  Still with bilateral foot pain.  Takes celebrex prn.  Does not take regularly.  Discussed possible side effects of the medication.  Also discused balance issues.  Discussed PT.  She will notify me if desires PT.    Past Medical History:  Diagnosis Date  . Allergy    allergy shots (Juengel)  . Anxiety   . Arthritis   . Asthma   . CAD (coronary artery disease)    cath 11/27/04 - moderated LAD disease  . Cancer ([place])    skin  . GERD (gastroesophageal reflux disease)    EGD (05/25/04) - slight presbyesophagus, mild esophagitis, mild gastritis, mild duodenitis - positive H. pylori.   Marland Kitchen Neuropathy    Past Surgical History:  Procedure Laterality Date  . ABDOMINAL HYSTERECTOMY    . APPENDECTOMY  1967   . COLONOSCOPY WITH PROPOFOL N/A 11/16/2019   Procedure: COLONOSCOPY WITH PROPOFOL;  Surgeon: Lin Landsman, MD;  Location: [facility] ENDOSCOPY;  Service: Gastroenterology;  Laterality: N/A;  . ESOPHAGOGASTRODUODENOSCOPY (EGD) WITH PROPOFOL N/A 11/16/2019   Procedure: ESOPHAGOGASTRODUODENOSCOPY (EGD) WITH PROPOFOL;  Surgeon: Lin Landsman, MD;  Location: [facility] ENDOSCOPY;  Service: Gastroenterology;  Laterality: N/A;   Family History  Problem Relation Age of Onset  . Arthritis Mother   . Stroke Sister   . Breast cancer Neg Hx    Social History   Socioeconomic History  . Marital status: Married    Spouse name: Not on file  . Number of children: Not on file  . Years of education: Not on file  . Highest education level: Not on file  Occupational History  . Not on file  Tobacco Use  . Smoking status: Never Smoker  . Smokeless tobacco: Never Used  Vaping Use  . Vaping Use: Never used  Substance and Sexual Activity  . Alcohol use: No    Alcohol/week: 0.0 standard drinks  . Drug use: No  . Sexual activity: Not on file  Other Topics Concern  . Not on file  Social History Narrative  . Not on file   Social Determinants of Health   Financial Resource Strain: Low Risk   . Difficulty of Paying Living Expenses: Not hard at all  Food Insecurity: Not on file  Transportation Needs: Not on file  Physical Activity:  Not on file  Stress: Not on file  Social Connections: Socially Integrated  . Frequency of Communication with Friends and Family: More than three times a week  . Frequency of Social Gatherings with Friends and Family: Twice a week  . Attends Religious Services: More than 4 times per year  . Active Member of Clubs or Organizations: Yes  . Attends Archivist Meetings: Not on file  . Marital Status: Married    Outpatient Encounter Medications as of 08/04/2020  Medication Sig  . ALPRAZolam (XANAX) 0.25 MG tablet TAKE ONE TABLET EVERY DAY AS NEEDED FOR ANXIETY   . Ascorbic Acid (VITAMIN C PO) Take by mouth daily.  Marland Kitchen aspirin 81 MG tablet Take 81 mg by mouth daily.  . Calcium Carbonate-Vit D-Min (CALCIUM 1200 PO) Take by mouth daily.  . celecoxib (CELEBREX) 200 MG capsule Take 1 capsule (200 mg total) by mouth daily.  . COD LIVER OIL PO Take by mouth.  . Coenzyme Q10 (CO Q-10) 100 MG CAPS Take 1 capsule by mouth daily.  Marland Kitchen docusate sodium (COLACE) 100 MG capsule Take by mouth.  . fexofenadine (ALLEGRA) 180 MG tablet Take 1 tablet (180 mg total) by mouth daily as needed for allergies or rhinitis.  Marland Kitchen FLUoxetine (PROZAC) 20 MG capsule Take 1 capsule (20 mg total) by mouth daily.  . hydrochlorothiazide (MICROZIDE) 12.5 MG capsule Take 1 capsule (12.5 mg total) by mouth daily.  Marland Kitchen lidocaine (XYLOCAINE) 5 % ointment Apply 1 application topically as needed.  . mometasone (ELOCON) 0.1 % lotion Apply 1 application topically as directed.  . Multiple Vitamin (MULTIVITAMIN) tablet Take 1 tablet by mouth daily.  . mupirocin ointment (BACTROBAN) 2 % Place 1 application into the nose 2 (two) times daily.  . Omega-3 Fatty Acids (FISH OIL PO) Take by mouth daily.  Marland Kitchen omeprazole (PRILOSEC) 40 MG capsule TAKE 1 CAPSULE EVERY DAY  . PROAIR HFA 108 (90 Base) MCG/ACT inhaler   . Probiotic Product (PROBIOTIC PO) Take by mouth.  . silver sulfADIAZINE (SILVADENE) 1 % cream Apply 1 application topically daily.  Marland Kitchen triamcinolone cream (KENALOG) 0.1 %   . VITAMIN D PO Take by mouth daily.  . [DISCONTINUED] ezetimibe (ZETIA) 10 MG tablet TAKE ONE TABLET BY MOUTH EVERY DAY   No facility-administered encounter medications on file as of 08/04/2020.    Review of Systems  Constitutional: Negative for appetite change and unexpected weight change.  HENT: Negative for congestion.   Respiratory: Negative for cough, chest tightness and shortness of breath.   Cardiovascular: Negative for chest pain, palpitations and leg swelling.  Gastrointestinal: Negative for abdominal pain, diarrhea,  nausea and vomiting.  Genitourinary: Negative for difficulty urinating and dysuria.  Musculoskeletal: Negative for joint swelling and myalgias.  Skin: Negative for color change and rash.  Neurological: Negative for dizziness, light-headedness and headaches.  Psychiatric/Behavioral: Negative for agitation and dysphoric mood.       Objective:    Physical Exam Vitals reviewed.  Constitutional:      General: She is not in acute distress.    Appearance: Normal appearance.  HENT:     Head: Normocephalic and atraumatic.     Right Ear: External ear normal.     Left Ear: External ear normal.     Mouth/Throat:     Mouth: Oropharynx is clear and moist.  Eyes:     General: No scleral icterus.       Right eye: No discharge.        Left eye:  No discharge.     Conjunctiva/sclera: Conjunctivae normal.  Neck:     Thyroid: No thyromegaly.  Cardiovascular:     Rate and Rhythm: Normal rate and regular rhythm.  Pulmonary:     Effort: No respiratory distress.     Breath sounds: Normal breath sounds. No wheezing.  Abdominal:     General: Bowel sounds are normal.     Palpations: Abdomen is soft.     Tenderness: There is no abdominal tenderness.  Musculoskeletal:        General: No swelling, tenderness or edema.     Cervical back: Neck supple. No tenderness.  Lymphadenopathy:     Cervical: No cervical adenopathy.  Skin:    Findings: No erythema or rash.  Neurological:     Mental Status: She is alert.  Psychiatric:        Mood and Affect: Mood normal.        Behavior: Behavior normal.     BP 138/72   Pulse 82   Temp 98.5 F (36.9 C) (Oral)   Resp 16   Ht 5\' 5"  (1.651 m)   Wt 129 lb 3.2 oz (58.6 kg)   LMP 08/29/1995   SpO2 98%   BMI 21.50 kg/m  Wt Readings from Last 3 Encounters:  08/04/20 129 lb 3.2 oz (58.6 kg)  06/16/20 129 lb (58.5 kg)  04/13/20 130 lb 6.4 oz (59.1 kg)     Lab Results  Component Value Date   WBC 5.0 08/04/2020   HGB 12.2 08/04/2020   HCT 36.7  08/04/2020   PLT 191.0 08/04/2020   GLUCOSE 76 08/04/2020   CHOL 203 (H) 08/04/2020   TRIG 108.0 08/04/2020   HDL 62.20 08/04/2020   LDLDIRECT 115.0 09/04/2012   LDLCALC 119 (H) 08/04/2020   ALT 11 08/04/2020   AST 16 08/04/2020   NA 140 08/04/2020   K 4.0 08/04/2020   CL 100 08/04/2020   CREATININE 1.01 08/04/2020   BUN 21 08/04/2020   CO2 32 08/04/2020   TSH 2.04 08/04/2020   HGBA1C 6.1 10/04/2017       Assessment & Plan:   Problem List Items Addressed This Visit    Anxiety    Increased stress. Son living with her now.  This is helping some with things around the house.  Does not feel needs any further intervention.  Follow.       Asthma    Breathing stable.       Balance problem    Discussed with her today.  Discussed PT.  Will notify me if agreeable for referral.       Essential hypertension    Has had problems taking multiple blood pressure medications as outlined.  Blood pressure as outlined.  Desires not to take medication at this time.  Follow pressures.  Follow metabolic panel.       GERD (gastroesophageal reflux disease)    No upper symptoms reported.  On prilosec.       Hypercholesterolemia    Discussed calculated cholesterol risk.  Discussed recommendation to start cholesterol medication.  She declines.  Follow lipid panel.  Low cholesterol diet and exercise.        Iron deficiency anemia    S/p EGD and colonoscopy.  Previously discussed capsule study.  Wanted to monitor.  Follow cbc and iron studies.       Right hip pain    Noticed when pressure on area. Discussed bursitis.  Discussed PT.  Wants to follow.  Paolo Okane, MD 

## 2020-08-06 ENCOUNTER — Encounter: Payer: Self-pay | Admitting: Internal Medicine

## 2020-08-06 DIAGNOSIS — M25551 Pain in right hip: Secondary | ICD-10-CM | POA: Insufficient documentation

## 2020-08-06 NOTE — Assessment & Plan Note (Signed)
Discussed with her today.  Discussed PT.  Will notify me if agreeable for referral.

## 2020-08-06 NOTE — Assessment & Plan Note (Signed)
No upper symptoms reported. On prilosec.  

## 2020-08-06 NOTE — Assessment & Plan Note (Signed)
Noticed when pressure on area. Discussed bursitis.  Discussed PT.  Wants to follow.

## 2020-08-06 NOTE — Assessment & Plan Note (Signed)
Discussed calculated cholesterol risk.  Discussed recommendation to start cholesterol medication.  She declines.  Follow lipid panel.  Low cholesterol diet and exercise.

## 2020-08-06 NOTE — Assessment & Plan Note (Signed)
Breathing stable.

## 2020-08-06 NOTE — Assessment & Plan Note (Signed)
Increased stress. Son living with her now.  This is helping some with things around the house.  Does not feel needs any further intervention.  Follow.

## 2020-08-06 NOTE — Assessment & Plan Note (Signed)
Has had problems taking multiple blood pressure medications as outlined.  Blood pressure as outlined.  Desires not to take medication at this time.  Follow pressures.  Follow metabolic panel.

## 2020-08-06 NOTE — Assessment & Plan Note (Signed)
S/p EGD and colonoscopy.  Previously discussed capsule study.  Wanted to monitor.  Follow cbc and iron studies.

## 2020-08-09 ENCOUNTER — Other Ambulatory Visit: Payer: Self-pay | Admitting: Internal Medicine

## 2020-08-19 DIAGNOSIS — L821 Other seborrheic keratosis: Secondary | ICD-10-CM | POA: Diagnosis not present

## 2020-08-19 DIAGNOSIS — L578 Other skin changes due to chronic exposure to nonionizing radiation: Secondary | ICD-10-CM | POA: Diagnosis not present

## 2020-08-19 DIAGNOSIS — B078 Other viral warts: Secondary | ICD-10-CM | POA: Diagnosis not present

## 2020-08-19 DIAGNOSIS — L281 Prurigo nodularis: Secondary | ICD-10-CM | POA: Diagnosis not present

## 2020-08-31 DIAGNOSIS — M5134 Other intervertebral disc degeneration, thoracic region: Secondary | ICD-10-CM | POA: Diagnosis not present

## 2020-08-31 DIAGNOSIS — M9903 Segmental and somatic dysfunction of lumbar region: Secondary | ICD-10-CM | POA: Diagnosis not present

## 2020-08-31 DIAGNOSIS — M9902 Segmental and somatic dysfunction of thoracic region: Secondary | ICD-10-CM | POA: Diagnosis not present

## 2020-08-31 DIAGNOSIS — M5416 Radiculopathy, lumbar region: Secondary | ICD-10-CM | POA: Diagnosis not present

## 2020-09-05 ENCOUNTER — Other Ambulatory Visit: Payer: Self-pay | Admitting: Internal Medicine

## 2020-09-27 DIAGNOSIS — M5134 Other intervertebral disc degeneration, thoracic region: Secondary | ICD-10-CM | POA: Diagnosis not present

## 2020-09-27 DIAGNOSIS — M9902 Segmental and somatic dysfunction of thoracic region: Secondary | ICD-10-CM | POA: Diagnosis not present

## 2020-09-27 DIAGNOSIS — M5416 Radiculopathy, lumbar region: Secondary | ICD-10-CM | POA: Diagnosis not present

## 2020-09-27 DIAGNOSIS — M9903 Segmental and somatic dysfunction of lumbar region: Secondary | ICD-10-CM | POA: Diagnosis not present

## 2020-10-04 ENCOUNTER — Other Ambulatory Visit: Payer: Self-pay

## 2020-10-04 ENCOUNTER — Ambulatory Visit (INDEPENDENT_AMBULATORY_CARE_PROVIDER_SITE_OTHER): Payer: Medicare Other | Admitting: Internal Medicine

## 2020-10-04 ENCOUNTER — Encounter: Payer: Self-pay | Admitting: Internal Medicine

## 2020-10-04 VITALS — BP 138/78 | HR 84 | Temp 98.2°F | Ht 65.0 in | Wt 131.6 lb

## 2020-10-04 DIAGNOSIS — J4520 Mild intermittent asthma, uncomplicated: Secondary | ICD-10-CM | POA: Diagnosis not present

## 2020-10-04 DIAGNOSIS — K219 Gastro-esophageal reflux disease without esophagitis: Secondary | ICD-10-CM

## 2020-10-04 DIAGNOSIS — D509 Iron deficiency anemia, unspecified: Secondary | ICD-10-CM

## 2020-10-04 DIAGNOSIS — E7800 Pure hypercholesterolemia, unspecified: Secondary | ICD-10-CM | POA: Diagnosis not present

## 2020-10-04 DIAGNOSIS — M25551 Pain in right hip: Secondary | ICD-10-CM

## 2020-10-04 DIAGNOSIS — I10 Essential (primary) hypertension: Secondary | ICD-10-CM

## 2020-10-04 DIAGNOSIS — R2689 Other abnormalities of gait and mobility: Secondary | ICD-10-CM

## 2020-10-04 NOTE — Progress Notes (Signed)
Patient ID: Patricia Pacheco, female   DOB: May 01, 1941, 80 y.o.   MRN: [id number]   Subjective:    Patient ID: Patricia Pacheco, female    DOB: Oct 19, 1940, 80 y.o.   MRN: [id number]  HPI This visit occurred during the SARS-CoV-2 public health emergency.  Safety protocols were in place, including screening questions prior to the visit, additional usage of staff PPE, and extensive cleaning of exam room while observing appropriate contact time as indicated for disinfecting solutions.  Patient here for a scheduled follow up. Here to follow up regarding her blood pressure.  She is taking hctz.  Has had intolerance to multiple blood pressure medications.  See 08/04/20 note.  Taking at night and tolerating.  No chest pain.  Breathing stable.  She did fall recently.  States she fell into her closet.  Right arm abrasion.  Hit nose.  No headache.  No dizziness or light headedness.  No residual problems.  No chest pain or sob.  No acid reflux.  No abdominal pain.  Bowels moving.  Some persistent right hip pain.  Request ortho referral.    Past Medical History:  Diagnosis Date  . Allergy    allergy shots (Juengel)  . Anxiety   . Arthritis   . Asthma   . CAD (coronary artery disease)    cath 11/27/04 - moderated LAD disease  . Cancer ([place])    skin  . GERD (gastroesophageal reflux disease)    EGD (05/25/04) - slight presbyesophagus, mild esophagitis, mild gastritis, mild duodenitis - positive H. pylori.   Marland Kitchen Neuropathy    Past Surgical History:  Procedure Laterality Date  . ABDOMINAL HYSTERECTOMY    . APPENDECTOMY  1967  . COLONOSCOPY WITH PROPOFOL N/A 11/16/2019   Procedure: COLONOSCOPY WITH PROPOFOL;  Surgeon: Lin Landsman, MD;  Location: [facility] ENDOSCOPY;  Service: Gastroenterology;  Laterality: N/A;  . ESOPHAGOGASTRODUODENOSCOPY (EGD) WITH PROPOFOL N/A 11/16/2019   Procedure: ESOPHAGOGASTRODUODENOSCOPY (EGD) WITH PROPOFOL;  Surgeon: Lin Landsman, MD;  Location: [facility] ENDOSCOPY;  Service:  Gastroenterology;  Laterality: N/A;   Family History  Problem Relation Age of Onset  . Arthritis Mother   . Stroke Sister   . Breast cancer Neg Hx    Social History   Socioeconomic History  . Marital status: Married    Spouse name: Not on file  . Number of children: Not on file  . Years of education: Not on file  . Highest education level: Not on file  Occupational History  . Not on file  Tobacco Use  . Smoking status: Never Smoker  . Smokeless tobacco: Never Used  Vaping Use  . Vaping Use: Never used  Substance and Sexual Activity  . Alcohol use: No    Alcohol/week: 0.0 standard drinks  . Drug use: No  . Sexual activity: Not on file  Other Topics Concern  . Not on file  Social History Narrative  . Not on file   Social Determinants of Health   Financial Resource Strain: Low Risk   . Difficulty of Paying Living Expenses: Not hard at all  Food Insecurity: Not on file  Transportation Needs: Not on file  Physical Activity: Not on file  Stress: Not on file  Social Connections: Socially Integrated  . Frequency of Communication with Friends and Family: More than three times a week  . Frequency of Social Gatherings with Friends and Family: Twice a week  . Attends Religious Services: More than 4 times per year  . Active Member  of Clubs or Organizations: Yes  . Attends Archivist Meetings: Not on file  . Marital Status: Married    Outpatient Encounter Medications as of 10/04/2020  Medication Sig  . ALPRAZolam (XANAX) 0.25 MG tablet TAKE ONE TABLET EVERY DAY AS NEEDED FOR ANXIETY  . Ascorbic Acid (VITAMIN C PO) Take by mouth daily.  Marland Kitchen aspirin 81 MG tablet Take 81 mg by mouth daily.  . Calcium Carbonate-Vit D-Min (CALCIUM 1200 PO) Take by mouth daily.  . celecoxib (CELEBREX) 200 MG capsule Take 1 capsule (200 mg total) by mouth daily.  . COD LIVER OIL PO Take by mouth.  . Coenzyme Q10 (CO Q-10) 100 MG CAPS Take 1 capsule by mouth daily.  Marland Kitchen docusate sodium  (COLACE) 100 MG capsule Take by mouth.  . fexofenadine (ALLEGRA) 180 MG tablet Take 1 tablet (180 mg total) by mouth daily as needed for allergies or rhinitis.  Marland Kitchen FLUoxetine (PROZAC) 20 MG capsule TAKE 1 CAPSULE BY MOUTH EVERY DAY  . hydrochlorothiazide (MICROZIDE) 12.5 MG capsule Take 1 capsule (12.5 mg total) by mouth daily.  Marland Kitchen lidocaine (XYLOCAINE) 5 % ointment Apply 1 application topically as needed.  . mometasone (ELOCON) 0.1 % lotion Apply 1 application topically as directed.  . Multiple Vitamin (MULTIVITAMIN) tablet Take 1 tablet by mouth daily.  . mupirocin ointment (BACTROBAN) 2 % Place 1 application into the nose 2 (two) times daily.  . Omega-3 Fatty Acids (FISH OIL PO) Take by mouth daily.  Marland Kitchen omeprazole (PRILOSEC) 40 MG capsule TAKE 1 CAPSULE BY MOUTH ONCE DAILY  . PROAIR HFA 108 (90 Base) MCG/ACT inhaler   . Probiotic Product (PROBIOTIC PO) Take by mouth.  . silver sulfADIAZINE (SILVADENE) 1 % cream Apply 1 application topically daily.  Marland Kitchen triamcinolone cream (KENALOG) 0.1 %   . VITAMIN D PO Take by mouth daily.   No facility-administered encounter medications on file as of 10/04/2020.    Review of Systems  Constitutional: Negative for appetite change and unexpected weight change.  HENT: Negative for congestion and sinus pressure.   Respiratory: Negative for cough, chest tightness and shortness of breath.   Cardiovascular: Negative for chest pain, palpitations and leg swelling.  Gastrointestinal: Negative for abdominal pain, diarrhea, nausea and vomiting.  Genitourinary: Negative for difficulty urinating and dysuria.  Musculoskeletal: Negative for joint swelling and myalgias.  Skin: Negative for color change and rash.  Neurological: Negative for dizziness, light-headedness and headaches.  Psychiatric/Behavioral: Negative for agitation and dysphoric mood.       Objective:    Physical Exam Vitals reviewed.  Constitutional:      General: She is not in acute distress.     Appearance: Normal appearance.  HENT:     Head: Normocephalic and atraumatic.     Right Ear: External ear normal.     Left Ear: External ear normal.  Eyes:     General: No scleral icterus.       Right eye: No discharge.        Left eye: No discharge.     Conjunctiva/sclera: Conjunctivae normal.  Neck:     Thyroid: No thyromegaly.  Cardiovascular:     Rate and Rhythm: Normal rate and regular rhythm.  Pulmonary:     Effort: No respiratory distress.     Breath sounds: Normal breath sounds. No wheezing.  Abdominal:     General: Bowel sounds are normal.     Palpations: Abdomen is soft.     Tenderness: There is no abdominal tenderness.  Musculoskeletal:        General: No swelling or tenderness.     Cervical back: Neck supple. No tenderness.  Lymphadenopathy:     Cervical: No cervical adenopathy.  Skin:    Findings: No erythema or rash.  Neurological:     Mental Status: She is alert.  Psychiatric:        Mood and Affect: Mood normal.        Behavior: Behavior normal.     BP 138/78   Pulse 84   Temp 98.2 F (36.8 C)   Ht 5\' 5"  (1.651 m)   Wt 131 lb 9.6 oz (59.7 kg)   LMP 08/29/1995   SpO2 97%   BMI 21.90 kg/m  Wt Readings from Last 3 Encounters:  10/04/20 131 lb 9.6 oz (59.7 kg)  08/04/20 129 lb 3.2 oz (58.6 kg)  06/16/20 129 lb (58.5 kg)     Lab Results  Component Value Date   WBC 5.0 08/04/2020   HGB 12.2 08/04/2020   HCT 36.7 08/04/2020   PLT 191.0 08/04/2020   GLUCOSE 83 10/04/2020   CHOL 203 (H) 08/04/2020   TRIG 108.0 08/04/2020   HDL 62.20 08/04/2020   LDLDIRECT 115.0 09/04/2012   LDLCALC 119 (H) 08/04/2020   ALT 11 08/04/2020   AST 16 08/04/2020   NA 138 10/04/2020   K 4.1 10/04/2020   CL 98 10/04/2020   CREATININE 1.01 10/04/2020   BUN 20 10/04/2020   CO2 33 (H) 10/04/2020   TSH 2.04 08/04/2020   HGBA1C 6.1 10/04/2017       Assessment & Plan:   Problem List Items Addressed This Visit    Asthma    Breathing stable.       Balance  problem    Discussed recent fall.  Some unsteadiness as outlined.  Discussed PT. She is agreeable.        Relevant Orders   Ambulatory referral to Physical Therapy   Essential hypertension - Primary    Blood pressure as outlined.  Continues on hctz.  Follow pressures.  Follow metabolic panel.       Relevant Orders   Basic metabolic panel (Completed)   GERD (gastroesophageal reflux disease)    No upper symptoms reported.  On prilosec.        Hypercholesterolemia    Have discussed calculated cholesterol risk.  She declines to start cholesterol medication.  Low cholesterol diet and exercise.  Follow lipid panel.        Iron deficiency anemia    S/p EGD and colonoscopy.  Follow cbc and iron studies.        Right hip pain    Persistent right hip pain.  Request referral to ortho.        Relevant Orders   Ambulatory referral to Orthopedic Surgery       Einar Pheasant, MD

## 2020-10-05 LAB — BASIC METABOLIC PANEL
BUN: 20 mg/dL (ref 6–23)
CO2: 33 mEq/L — ABNORMAL HIGH (ref 19–32)
Calcium: 9.5 mg/dL (ref 8.4–10.5)
Chloride: 98 mEq/L (ref 96–112)
Creatinine, Ser: 1.01 mg/dL (ref 0.40–1.20)
GFR: 52.36 mL/min — ABNORMAL LOW (ref >60.00)
Glucose, Bld: 83 mg/dL (ref 70–99)
Potassium: 4.1 mEq/L (ref 3.5–5.1)
Sodium: 138 mEq/L (ref 135–145)

## 2020-10-09 ENCOUNTER — Encounter: Payer: Self-pay | Admitting: Internal Medicine

## 2020-10-09 NOTE — Progress Notes (Incomplete)
Patient ID: Patricia Pacheco, female   DOB: Apr 14, 1941, 80 y.o.   MRN: [id number]   Subjective:    Patient ID: Patricia Pacheco, female    DOB: 1941/01/05, 80 y.o.   MRN: [id number]  HPI This visit occurred during the SARS-CoV-2 public health emergency.  Safety protocols were in place, including screening questions prior to the visit, additional usage of staff PPE, and extensive cleaning of exam room while observing appropriate contact time as indicated for disinfecting solutions.  Patient here for a scheduled follow up. Here to follow up regarding her blood pressure.  She is taking hctz.  Has had intolerance to multiple blood pressure medications.  See 08/04/20 note.  Taking at night and tolerating.  No chest pain.  Breathing stable.  She did fall recently.  States she fell into her closet.  Right arm abrasion.  Hit nose.  No headache.  No dizziness or light headedness.  No residual problems.  No chest pain or sob.  No acid reflux.  No abdominal pain.  Bowels moving.  Some persistent right hip pain.  Request ortho referral.    Past Medical History:  Diagnosis Date  . Allergy    allergy shots (Juengel)  . Anxiety   . Arthritis   . Asthma   . CAD (coronary artery disease)    cath 11/27/04 - moderated LAD disease  . Cancer ([place])    skin  . GERD (gastroesophageal reflux disease)    EGD (05/25/04) - slight presbyesophagus, mild esophagitis, mild gastritis, mild duodenitis - positive H. pylori.   Marland Kitchen Neuropathy    Past Surgical History:  Procedure Laterality Date  . ABDOMINAL HYSTERECTOMY    . APPENDECTOMY  1967  . COLONOSCOPY WITH PROPOFOL N/A 11/16/2019   Procedure: COLONOSCOPY WITH PROPOFOL;  Surgeon: Lin Landsman, MD;  Location: [facility] ENDOSCOPY;  Service: Gastroenterology;  Laterality: N/A;  . ESOPHAGOGASTRODUODENOSCOPY (EGD) WITH PROPOFOL N/A 11/16/2019   Procedure: ESOPHAGOGASTRODUODENOSCOPY (EGD) WITH PROPOFOL;  Surgeon: Lin Landsman, MD;  Location: [facility] ENDOSCOPY;  Service:  Gastroenterology;  Laterality: N/A;   Family History  Problem Relation Age of Onset  . Arthritis Mother   . Stroke Sister   . Breast cancer Neg Hx    Social History   Socioeconomic History  . Marital status: Married    Spouse name: Not on file  . Number of children: Not on file  . Years of education: Not on file  . Highest education level: Not on file  Occupational History  . Not on file  Tobacco Use  . Smoking status: Never Smoker  . Smokeless tobacco: Never Used  Vaping Use  . Vaping Use: Never used  Substance and Sexual Activity  . Alcohol use: No    Alcohol/week: 0.0 standard drinks  . Drug use: No  . Sexual activity: Not on file  Other Topics Concern  . Not on file  Social History Narrative  . Not on file   Social Determinants of Health   Financial Resource Strain: Low Risk   . Difficulty of Paying Living Expenses: Not hard at all  Food Insecurity: Not on file  Transportation Needs: Not on file  Physical Activity: Not on file  Stress: Not on file  Social Connections: Socially Integrated  . Frequency of Communication with Friends and Family: More than three times a week  . Frequency of Social Gatherings with Friends and Family: Twice a week  . Attends Religious Services: More than 4 times per year  . Active Member  of Clubs or Organizations: Yes  . Attends Archivist Meetings: Not on file  . Marital Status: Married    Outpatient Encounter Medications as of 10/04/2020  Medication Sig  . ALPRAZolam (XANAX) 0.25 MG tablet TAKE ONE TABLET EVERY DAY AS NEEDED FOR ANXIETY  . Ascorbic Acid (VITAMIN C PO) Take by mouth daily.  Marland Kitchen aspirin 81 MG tablet Take 81 mg by mouth daily.  . Calcium Carbonate-Vit D-Min (CALCIUM 1200 PO) Take by mouth daily.  . celecoxib (CELEBREX) 200 MG capsule Take 1 capsule (200 mg total) by mouth daily.  . COD LIVER OIL PO Take by mouth.  . Coenzyme Q10 (CO Q-10) 100 MG CAPS Take 1 capsule by mouth daily.  Marland Kitchen docusate sodium  (COLACE) 100 MG capsule Take by mouth.  . fexofenadine (ALLEGRA) 180 MG tablet Take 1 tablet (180 mg total) by mouth daily as needed for allergies or rhinitis.  Marland Kitchen FLUoxetine (PROZAC) 20 MG capsule TAKE 1 CAPSULE BY MOUTH EVERY DAY  . hydrochlorothiazide (MICROZIDE) 12.5 MG capsule Take 1 capsule (12.5 mg total) by mouth daily.  Marland Kitchen lidocaine (XYLOCAINE) 5 % ointment Apply 1 application topically as needed.  . mometasone (ELOCON) 0.1 % lotion Apply 1 application topically as directed.  . Multiple Vitamin (MULTIVITAMIN) tablet Take 1 tablet by mouth daily.  . mupirocin ointment (BACTROBAN) 2 % Place 1 application into the nose 2 (two) times daily.  . Omega-3 Fatty Acids (FISH OIL PO) Take by mouth daily.  Marland Kitchen omeprazole (PRILOSEC) 40 MG capsule TAKE 1 CAPSULE BY MOUTH ONCE DAILY  . PROAIR HFA 108 (90 Base) MCG/ACT inhaler   . Probiotic Product (PROBIOTIC PO) Take by mouth.  . silver sulfADIAZINE (SILVADENE) 1 % cream Apply 1 application topically daily.  Marland Kitchen triamcinolone cream (KENALOG) 0.1 %   . VITAMIN D PO Take by mouth daily.   No facility-administered encounter medications on file as of 10/04/2020.    Review of Systems  Constitutional: Negative for appetite change and unexpected weight change.  HENT: Negative for congestion and sinus pressure.   Respiratory: Negative for cough, chest tightness and shortness of breath.   Cardiovascular: Negative for chest pain, palpitations and leg swelling.  Gastrointestinal: Negative for abdominal pain, diarrhea, nausea and vomiting.  Genitourinary: Negative for difficulty urinating and dysuria.  Musculoskeletal: Negative for joint swelling and myalgias.  Skin: Negative for color change and rash.  Neurological: Negative for dizziness, light-headedness and headaches.  Psychiatric/Behavioral: Negative for agitation and dysphoric mood.       Objective:    Physical Exam Vitals reviewed.  Constitutional:      General: She is not in acute distress.     Appearance: Normal appearance.  HENT:     Head: Normocephalic and atraumatic.     Right Ear: External ear normal.     Left Ear: External ear normal.  Eyes:     General: No scleral icterus.       Right eye: No discharge.        Left eye: No discharge.     Conjunctiva/sclera: Conjunctivae normal.  Neck:     Thyroid: No thyromegaly.  Cardiovascular:     Rate and Rhythm: Normal rate and regular rhythm.  Pulmonary:     Effort: No respiratory distress.     Breath sounds: Normal breath sounds. No wheezing.  Abdominal:     General: Bowel sounds are normal.     Palpations: Abdomen is soft.     Tenderness: There is no abdominal tenderness.  Musculoskeletal:        General: No swelling or tenderness.     Cervical back: Neck supple. No tenderness.  Lymphadenopathy:     Cervical: No cervical adenopathy.  Skin:    Findings: No erythema or rash.  Neurological:     Mental Status: She is alert.  Psychiatric:        Mood and Affect: Mood normal.        Behavior: Behavior normal.     BP (!) 142/70   Pulse 84   Temp 98.2 F (36.8 C)   Ht 5\' 5"  (1.651 m)   Wt 131 lb 9.6 oz (59.7 kg)   LMP 08/29/1995   SpO2 97%   BMI 21.90 kg/m  Wt Readings from Last 3 Encounters:  10/04/20 131 lb 9.6 oz (59.7 kg)  08/04/20 129 lb 3.2 oz (58.6 kg)  06/16/20 129 lb (58.5 kg)     Lab Results  Component Value Date   WBC 5.0 08/04/2020   HGB 12.2 08/04/2020   HCT 36.7 08/04/2020   PLT 191.0 08/04/2020   GLUCOSE 76 08/04/2020   CHOL 203 (H) 08/04/2020   TRIG 108.0 08/04/2020   HDL 62.20 08/04/2020   LDLDIRECT 115.0 09/04/2012   LDLCALC 119 (H) 08/04/2020   ALT 11 08/04/2020   AST 16 08/04/2020   NA 140 08/04/2020   K 4.0 08/04/2020   CL 100 08/04/2020   CREATININE 1.01 08/04/2020   BUN 21 08/04/2020   CO2 32 08/04/2020   TSH 2.04 08/04/2020   HGBA1C 6.1 10/04/2017       Assessment & Plan:   Problem List Items Addressed This Visit   None      Einar Pheasant, MD

## 2020-10-09 NOTE — Assessment & Plan Note (Signed)
Have discussed calculated cholesterol risk.  She declines to start cholesterol medication.  Low cholesterol diet and exercise.  Follow lipid panel.

## 2020-10-10 ENCOUNTER — Other Ambulatory Visit: Payer: Self-pay | Admitting: Internal Medicine

## 2020-10-10 ENCOUNTER — Telehealth: Payer: Self-pay | Admitting: Internal Medicine

## 2020-10-10 DIAGNOSIS — Z1231 Encounter for screening mammogram for malignant neoplasm of breast: Secondary | ICD-10-CM

## 2020-10-10 NOTE — Assessment & Plan Note (Signed)
No upper symptoms reported. On prilosec.  

## 2020-10-10 NOTE — Telephone Encounter (Signed)
Pt called and said that she didn't want the referral to Physical Therapy placed

## 2020-10-10 NOTE — Assessment & Plan Note (Signed)
Blood pressure as outlined.  Continues on hctz.  Follow pressures.  Follow metabolic panel.

## 2020-10-10 NOTE — Assessment & Plan Note (Signed)
S/p EGD and colonoscopy.  Follow cbc and iron studies.  

## 2020-10-10 NOTE — Assessment & Plan Note (Signed)
Breathing stable.

## 2020-10-10 NOTE — Assessment & Plan Note (Signed)
Discussed recent fall.  Some unsteadiness as outlined.  Discussed PT. She is agreeable.

## 2020-10-10 NOTE — Assessment & Plan Note (Signed)
Persistent right hip pain.  Request referral to ortho.

## 2020-10-26 ENCOUNTER — Ambulatory Visit
Admission: RE | Admit: 2020-10-26 | Discharge: 2020-10-26 | Disposition: A | Discharge status: Home / Self Care | Payer: Medicare Other | Source: Ambulatory Visit | Attending: Internal Medicine | Admitting: Internal Medicine

## 2020-10-26 ENCOUNTER — Other Ambulatory Visit: Payer: Self-pay

## 2020-10-26 ENCOUNTER — Other Ambulatory Visit: Payer: Self-pay | Admitting: Internal Medicine

## 2020-10-26 DIAGNOSIS — R928 Other abnormal and inconclusive findings on diagnostic imaging of breast: Secondary | ICD-10-CM

## 2020-10-26 DIAGNOSIS — Z1231 Encounter for screening mammogram for malignant neoplasm of breast: Secondary | ICD-10-CM | POA: Diagnosis not present

## 2020-10-26 DIAGNOSIS — N6489 Other specified disorders of breast: Secondary | ICD-10-CM

## 2020-10-31 ENCOUNTER — Other Ambulatory Visit: Payer: Self-pay | Admitting: Internal Medicine

## 2020-10-31 DIAGNOSIS — M9903 Segmental and somatic dysfunction of lumbar region: Secondary | ICD-10-CM | POA: Diagnosis not present

## 2020-10-31 DIAGNOSIS — M5416 Radiculopathy, lumbar region: Secondary | ICD-10-CM | POA: Diagnosis not present

## 2020-10-31 DIAGNOSIS — M9902 Segmental and somatic dysfunction of thoracic region: Secondary | ICD-10-CM | POA: Diagnosis not present

## 2020-10-31 DIAGNOSIS — M5134 Other intervertebral disc degeneration, thoracic region: Secondary | ICD-10-CM | POA: Diagnosis not present

## 2020-11-01 ENCOUNTER — Ambulatory Visit
Admission: RE | Admit: 2020-11-01 | Discharge: 2020-11-01 | Disposition: A | Discharge status: Home / Self Care | Payer: Medicare Other | Source: Ambulatory Visit | Attending: Internal Medicine | Admitting: Internal Medicine

## 2020-11-01 ENCOUNTER — Other Ambulatory Visit: Payer: Self-pay

## 2020-11-01 DIAGNOSIS — N6489 Other specified disorders of breast: Secondary | ICD-10-CM | POA: Insufficient documentation

## 2020-11-01 DIAGNOSIS — R922 Inconclusive mammogram: Secondary | ICD-10-CM | POA: Diagnosis not present

## 2020-11-01 DIAGNOSIS — R928 Other abnormal and inconclusive findings on diagnostic imaging of breast: Secondary | ICD-10-CM

## 2020-11-01 DIAGNOSIS — M25551 Pain in right hip: Secondary | ICD-10-CM | POA: Diagnosis not present

## 2020-11-01 DIAGNOSIS — M7061 Trochanteric bursitis, right hip: Secondary | ICD-10-CM | POA: Diagnosis not present

## 2020-11-02 ENCOUNTER — Telehealth: Payer: Self-pay | Admitting: Internal Medicine

## 2020-11-02 NOTE — Telephone Encounter (Signed)
PT called in to return [place] call from earlier.

## 2020-11-03 NOTE — Telephone Encounter (Signed)
See result note.  

## 2020-11-07 ENCOUNTER — Other Ambulatory Visit: Payer: Self-pay | Admitting: Internal Medicine

## 2020-11-25 ENCOUNTER — Telehealth: Payer: Self-pay | Admitting: Internal Medicine

## 2020-11-25 NOTE — Telephone Encounter (Signed)
Needs to be seen/evaluated - for treatment advice.  Please confirm she is being seen.

## 2020-11-25 NOTE — Telephone Encounter (Signed)
LMTCB

## 2020-11-25 NOTE — Telephone Encounter (Signed)
Access Nurse was told we have no available appts today

## 2020-11-25 NOTE — Telephone Encounter (Signed)
Pt called in stating that she has covid and is experiencing body aches, sore throat, with a headache. Patient was instructed to go to the ED. No ED visit has been documented in Epic as of now.

## 2020-11-25 NOTE — Telephone Encounter (Signed)
Pt transferred to Access Nurse   Pt called she tested positive for Covid today, she is having a headache and body aches

## 2020-11-28 NOTE — Telephone Encounter (Signed)
Called patient. Unable to leave message.

## 2020-11-29 NOTE — Telephone Encounter (Signed)
Called patient. Phone just kept ringing. No answer and no machine to leave message

## 2020-11-29 NOTE — Telephone Encounter (Signed)
PT called to return the missed call 

## 2020-11-29 NOTE — Telephone Encounter (Signed)
Left message for patient to call back. No other number listed

## 2020-11-29 NOTE — Telephone Encounter (Signed)
Tested positive for COVID 6/2. Was instructed to go to ED or urgent care but did not want to go. Patients son got her an appointment with a doctor he knew. She was prescribed ivermectin and took her last pill today.  She could not tell me the name of the doctor she saw so I could request records. She is supposed to call her son and get me the information. Confirmed she is feeling better but not back to 100% yet. no sob, chest tightness, fever, chills, body aches, etc.

## 2021-01-06 ENCOUNTER — Ambulatory Visit: Payer: Medicare Other | Admitting: Internal Medicine

## 2021-01-09 ENCOUNTER — Other Ambulatory Visit: Payer: Self-pay | Admitting: Internal Medicine

## 2021-01-09 ENCOUNTER — Other Ambulatory Visit: Payer: Self-pay | Admitting: Podiatry

## 2021-01-09 ENCOUNTER — Telehealth: Payer: Self-pay | Admitting: Podiatry

## 2021-01-09 MED ORDER — CELECOXIB 200 MG PO CAPS: 200.0000 mg | ORAL_CAPSULE | Freq: Every day | ORAL | 3 refills | Status: AC

## 2021-01-09 NOTE — Telephone Encounter (Signed)
Patient would like a refill on pain Celebrex. She states it has expired.

## 2021-01-12 ENCOUNTER — Ambulatory Visit: Payer: Medicare Other

## 2021-02-21 DIAGNOSIS — C4441 Basal cell carcinoma of skin of scalp and neck: Secondary | ICD-10-CM | POA: Diagnosis not present

## 2021-02-21 DIAGNOSIS — D485 Neoplasm of uncertain behavior of skin: Secondary | ICD-10-CM | POA: Diagnosis not present

## 2021-02-21 DIAGNOSIS — L821 Other seborrheic keratosis: Secondary | ICD-10-CM | POA: Diagnosis not present

## 2021-02-21 DIAGNOSIS — D225 Melanocytic nevi of trunk: Secondary | ICD-10-CM | POA: Diagnosis not present

## 2021-02-21 DIAGNOSIS — L578 Other skin changes due to chronic exposure to nonionizing radiation: Secondary | ICD-10-CM | POA: Diagnosis not present

## 2021-03-01 ENCOUNTER — Encounter: Payer: Self-pay | Admitting: Family Medicine

## 2021-03-01 ENCOUNTER — Ambulatory Visit (INDEPENDENT_AMBULATORY_CARE_PROVIDER_SITE_OTHER): Payer: Medicare Other | Admitting: Family Medicine

## 2021-03-01 DIAGNOSIS — J0140 Acute pansinusitis, unspecified: Secondary | ICD-10-CM

## 2021-03-01 MED ORDER — AMOXICILLIN 500 MG PO TABS: 500.0000 mg | ORAL_TABLET | Freq: Two times a day (BID) | ORAL | 0 refills | Status: AC

## 2021-03-01 NOTE — Progress Notes (Signed)
Virtual Visit via Telephone Note  I connected with Patricia Pacheco on 03/01/21 at  2:00 PM EDT by telephone and verified that I am speaking with the correct person using two identifiers.   I discussed the limitations, risks, security and privacy concerns of performing an evaluation and management service by telephone and the availability of in person appointments. I also discussed with the patient that there may be a patient responsible charge related to this service. The patient expressed understanding and agreed to proceed.  Location patient: home Location provider: work or home office Participants present for the call: patient, provider Patient did not have a visit in the prior 7 days to address this/these issue(s).  Chief Complaint  Patient presents with   Sinus Problem    Started Friday before labor day, with pain going up her nose and between her eyes. Has some chest congestion now. Taken some allegra and robitussin     History of Present Illness: Pt with pain in nose, between eyes, and into head that started the Friday of Labor day wknd.  Pt with nasal congestion, cough, chest congestion, R ear pain/pressure, mild sore throat.  Symptoms improving some.   Denies n/v, fever, sick contacts. Tried Robitussin and Allegra.  Pt notes nasal sprays even the saline cause bleeding.  Uses a "salve" in her nose.   Observations/Objective: Patient sounds cheerful and well on the phone. I do not appreciate any SOB. Speech and thought processing are grossly intact. Patient reported vitals:  Assessment and Plan: Acute pansinusitis, recurrence not specified  -continue supportive care -advised to take an at home COVID test -given precautions -wait and see rx for amoxicillin sent to pharmacy as pt states symptoms seem to be improving. - Plan: amoxicillin (AMOXIL) 500 MG tablet   Follow Up Instructions:  F/u with pcp prn   [id number] [id number] [id number] 11-20 [id number] 21-30 I did not refer this patient  for an OV in the next 24 hours for this/these issue(s).  I discussed the assessment and treatment plan with the patient. The patient was provided an opportunity to ask questions and all were answered. The patient agreed with the plan and demonstrated an understanding of the instructions.   The patient was advised to call back or seek an in-person evaluation if the symptoms worsen or if the condition fails to improve as anticipated.  I provided 12 minutes of non-face-to-face time during this encounter.   Billie Ruddy, MD

## 2021-03-07 DIAGNOSIS — C4441 Basal cell carcinoma of skin of scalp and neck: Secondary | ICD-10-CM | POA: Diagnosis not present

## 2021-03-14 ENCOUNTER — Other Ambulatory Visit: Payer: Self-pay | Admitting: Internal Medicine

## 2021-03-15 ENCOUNTER — Other Ambulatory Visit: Payer: Self-pay | Admitting: Internal Medicine

## 2021-04-07 ENCOUNTER — Telehealth: Payer: Self-pay | Admitting: Internal Medicine

## 2021-04-07 NOTE — Telephone Encounter (Signed)
Copied from [place] [phone]. Topic: Medicare AWV >> Apr 07, 2021 10:36 AM Harris-Coley, Hannah Beat wrote: Reason for CRM: LVM 04/07/21 @10 :35am to r/s AWV appt [facility].  Please call Juliann Pulse [phone]

## 2021-04-12 ENCOUNTER — Ambulatory Visit: Payer: Medicare Other

## 2021-05-20 ENCOUNTER — Other Ambulatory Visit: Payer: Self-pay | Admitting: Internal Medicine

## 2021-07-04 ENCOUNTER — Telehealth: Payer: Self-pay | Admitting: Internal Medicine

## 2021-07-04 NOTE — Telephone Encounter (Signed)
Pt called In stating that she has plantar fasciitis in both feet and want physical therapy because the medicine is not helping

## 2021-07-06 NOTE — Telephone Encounter (Signed)
If having problems with plantar fasciitis, would recommend referral to podiatry.  If agreeable, can refer to [facility] or podiatrist of choice.

## 2021-07-06 NOTE — Telephone Encounter (Signed)
Spoke with patient. Already established with podiatry. She is going to call them and let me know if I need to do anything.

## 2021-07-24 ENCOUNTER — Other Ambulatory Visit: Payer: Self-pay | Admitting: Internal Medicine

## 2021-07-25 NOTE — Telephone Encounter (Signed)
I refilled her prozac x 1.  She needs a f/u appt before next refill.  Overdue.

## 2021-07-28 NOTE — Telephone Encounter (Signed)
LM for pt to schedule.

## 2021-08-07 ENCOUNTER — Encounter: Payer: Self-pay | Admitting: Family Medicine

## 2021-08-07 ENCOUNTER — Ambulatory Visit (INDEPENDENT_AMBULATORY_CARE_PROVIDER_SITE_OTHER): Payer: Medicare Other | Admitting: Family Medicine

## 2021-08-07 ENCOUNTER — Other Ambulatory Visit: Payer: Self-pay

## 2021-08-07 DIAGNOSIS — N390 Urinary tract infection, site not specified: Secondary | ICD-10-CM | POA: Insufficient documentation

## 2021-08-07 DIAGNOSIS — N3000 Acute cystitis without hematuria: Secondary | ICD-10-CM

## 2021-08-07 DIAGNOSIS — Z9109 Other allergy status, other than to drugs and biological substances: Secondary | ICD-10-CM

## 2021-08-07 LAB — URINALYSIS, MICROSCOPIC ONLY

## 2021-08-07 LAB — POCT URINALYSIS DIP (MANUAL ENTRY)
Glucose, UA: NEGATIVE mg/dL
Nitrite, UA: NEGATIVE
Protein Ur, POC: >=300 mg/dL — AB
Spec Grav, UA: >=1.03 — AB (ref 1.010–1.025)
Urobilinogen, UA: 0.2 E.U./dL
pH, UA: 5.5 (ref 5.0–8.0)

## 2021-08-07 MED ORDER — CEPHALEXIN 500 MG PO CAPS: 500.0000 mg | ORAL_CAPSULE | Freq: Four times a day (QID) | ORAL | 0 refills | Status: DC

## 2021-08-07 NOTE — Assessment & Plan Note (Signed)
Chronic issues with allergies.  I encouraged her to try her Allegra.  If this is not beneficial she could try an antihistamine nasal spray.

## 2021-08-07 NOTE — Patient Instructions (Signed)
Nice to meet you. We will treat you with Keflex for your UTI.  If you are not improving with this please let me know.  We will send your urine for culture and microscopy and we will contact you with those results.

## 2021-08-07 NOTE — Assessment & Plan Note (Signed)
Patient's symptoms are consistent with a UTI.  Urinalysis is consistent with this as well.  We will send urine for culture and microscopy.  We will treat with Keflex 500 mg 4 times daily.  If not improving she will let us know.

## 2021-08-07 NOTE — Progress Notes (Signed)
Tommi Rumps, MD Phone: [phone]  Patricia Pacheco is a 81 y.o. female who presents today for same day visit.   Dysuria: Onset over the past several days. Notes this is similar to prior UTIs. Notes some mild low back pain with this.  Dysuria- yes  Frequency- yes   Urgency- yes   Hematuria- no   Fever- no  Abd pain- no   Vaginal d/c- no  Allergic rhinitis: Patient notes a long history of allergies.  She notes recently she has had a scratchy throat and some postnasal drip.  No congestion.  No cough.  No fevers.  No sore throat.  She has not taken her allergy medicine today.  Social History   Tobacco Use  Smoking Status Never  Smokeless Tobacco Never    Current Outpatient Medications on File Prior to Visit  Medication Sig Dispense Refill   ALPRAZolam (XANAX) 0.25 MG tablet TAKE ONE TABLET EVERY DAY AS NEEDED FOR ANXIETY 30 tablet 0   Ascorbic Acid (VITAMIN C PO) Take by mouth daily.     aspirin 81 MG tablet Take 81 mg by mouth daily.     Calcium Carbonate-Vit D-Min (CALCIUM 1200 PO) Take by mouth daily.     celecoxib (CELEBREX) 200 MG capsule Take 1 capsule (200 mg total) by mouth daily. 90 capsule 3   COD LIVER OIL PO Take by mouth.     Coenzyme Q10 (CO Q-10) 100 MG CAPS Take 1 capsule by mouth daily.     docusate sodium (COLACE) 100 MG capsule Take by mouth.     fexofenadine (ALLEGRA) 180 MG tablet Take 1 tablet (180 mg total) by mouth daily as needed for allergies or rhinitis. 30 tablet 0   FLUoxetine (PROZAC) 20 MG capsule TAKE 1 CAPSULE BY MOUTH ONCE DAILY 30 capsule 0   hydrochlorothiazide (MICROZIDE) 12.5 MG capsule TAKE 1 CAPSULE BY MOUTH DAILY. 30 capsule 2   lidocaine (XYLOCAINE) 5 % ointment Apply 1 application topically as needed. 35.44 g 0   mometasone (ELOCON) 0.1 % lotion Apply 1 application topically as directed.     Multiple Vitamin (MULTIVITAMIN) tablet Take 1 tablet by mouth daily.     mupirocin ointment (BACTROBAN) 2 % Place 1 application into the nose 2  (two) times daily. 22 g 0   Omega-3 Fatty Acids (FISH OIL PO) Take by mouth daily.     omeprazole (PRILOSEC) 40 MG capsule TAKE 1 CAPSULE BY MOUTH ONCE DAILY 30 capsule 5   PROAIR HFA 108 (90 Base) MCG/ACT inhaler      Probiotic Product (PROBIOTIC PO) Take by mouth.     silver sulfADIAZINE (SILVADENE) 1 % cream Apply 1 application topically daily. 50 g 0   triamcinolone cream (KENALOG) 0.1 %      VITAMIN D PO Take by mouth daily.     No current facility-administered medications on file prior to visit.     ROS see history of present illness  Objective  Physical Exam Vitals:   08/07/21 1125  BP: 120/80  Pulse: 86  Temp: 98.8 F (37.1 C)  SpO2: 96%    BP Readings from Last 3 Encounters:  08/07/21 120/80  10/09/20 138/78  08/04/20 138/72   Wt Readings from Last 3 Encounters:  08/07/21 127 lb 12.8 oz (58 kg)  10/04/20 131 lb 9.6 oz (59.7 kg)  08/04/20 129 lb 3.2 oz (58.6 kg)    Physical Exam Constitutional:      General: She is not in acute distress.  Appearance: She is not diaphoretic.  Pulmonary:     Effort: Pulmonary effort is normal.  Abdominal:     General: Bowel sounds are normal. There is no distension.     Palpations: Abdomen is soft.     Tenderness: There is no abdominal tenderness.  Musculoskeletal:     Comments: No midline spine tenderness, no midline spine step-off, no muscular back tenderness  Skin:    General: Skin is warm and dry.  Neurological:     Mental Status: She is alert.     Assessment/Plan: Please see individual problem list.  Problem List Items Addressed This Visit     Environmental allergies    Chronic issues with allergies.  I encouraged her to try her Allegra.  If this is not beneficial she could try an antihistamine nasal spray.      UTI (urinary tract infection)    Patient's symptoms are consistent with a UTI.  Urinalysis is consistent with this as well.  We will send urine for culture and microscopy.  We will treat with  Keflex 500 mg 4 times daily.  If not improving she will let us know.      Relevant Medications   cephALEXin (KEFLEX) 500 MG capsule   Other Relevant Orders   POCT urinalysis dipstick (Completed)   Urine Culture   Urine Microscopic     Return if symptoms worsen or fail to improve.  This visit occurred during the SARS-CoV-2 public health emergency.  Safety protocols were in place, including screening questions prior to the visit, additional usage of staff PPE, and extensive cleaning of exam room while observing appropriate contact time as indicated for disinfecting solutions.    Tommi Rumps, MD [place]

## 2021-08-08 LAB — URINE CULTURE
MICRO NUMBER:: 13000537
SPECIMEN QUALITY:: ADEQUATE

## 2021-08-14 ENCOUNTER — Other Ambulatory Visit: Payer: Self-pay | Admitting: Internal Medicine

## 2021-08-14 DIAGNOSIS — R319 Hematuria, unspecified: Secondary | ICD-10-CM

## 2021-08-14 NOTE — Progress Notes (Signed)
Order placed for urology referral.  

## 2021-08-23 ENCOUNTER — Other Ambulatory Visit: Payer: Self-pay | Admitting: Internal Medicine

## 2021-08-24 ENCOUNTER — Ambulatory Visit (INDEPENDENT_AMBULATORY_CARE_PROVIDER_SITE_OTHER): Payer: Medicare Other | Admitting: Internal Medicine

## 2021-08-24 ENCOUNTER — Encounter: Payer: Self-pay | Admitting: Internal Medicine

## 2021-08-24 ENCOUNTER — Other Ambulatory Visit: Payer: Self-pay

## 2021-08-24 VITALS — BP 110/74 | HR 77 | Temp 98.3°F | Ht 65.0 in | Wt 129.0 lb

## 2021-08-24 DIAGNOSIS — E559 Vitamin D deficiency, unspecified: Secondary | ICD-10-CM | POA: Diagnosis not present

## 2021-08-24 DIAGNOSIS — R399 Unspecified symptoms and signs involving the genitourinary system: Secondary | ICD-10-CM

## 2021-08-24 DIAGNOSIS — Z87440 Personal history of urinary (tract) infections: Secondary | ICD-10-CM | POA: Diagnosis not present

## 2021-08-24 DIAGNOSIS — I10 Essential (primary) hypertension: Secondary | ICD-10-CM | POA: Diagnosis not present

## 2021-08-24 DIAGNOSIS — Z1329 Encounter for screening for other suspected endocrine disorder: Secondary | ICD-10-CM | POA: Diagnosis not present

## 2021-08-24 LAB — COMPREHENSIVE METABOLIC PANEL
ALT: 12 U/L (ref 0–35)
AST: 19 U/L (ref 0–37)
Albumin: 4.1 g/dL (ref 3.5–5.2)
Alkaline Phosphatase: 85 U/L (ref 39–117)
BUN: 22 mg/dL (ref 6–23)
CO2: 33 mEq/L — ABNORMAL HIGH (ref 19–32)
Calcium: 9.4 mg/dL (ref 8.4–10.5)
Chloride: 104 mEq/L (ref 96–112)
Creatinine, Ser: 0.94 mg/dL (ref 0.40–1.20)
GFR: 56.71 mL/min — ABNORMAL LOW (ref >60.00)
Glucose, Bld: 86 mg/dL (ref 70–99)
Potassium: 4.1 mEq/L (ref 3.5–5.1)
Sodium: 142 mEq/L (ref 135–145)
Total Bilirubin: 0.4 mg/dL (ref 0.2–1.2)
Total Protein: 6.6 g/dL (ref 6.0–8.3)

## 2021-08-24 LAB — CBC WITH DIFFERENTIAL/PLATELET
Basophils Absolute: 0 10*3/uL (ref 0.0–0.1)
Basophils Relative: 1.1 % (ref 0.0–3.0)
Eosinophils Absolute: 0.1 10*3/uL (ref 0.0–0.7)
Eosinophils Relative: 3.2 % (ref 0.0–5.0)
HCT: 35.7 % — ABNORMAL LOW (ref 36.0–46.0)
Hemoglobin: 11.5 g/dL — ABNORMAL LOW (ref 12.0–15.0)
Lymphocytes Relative: 42.1 % (ref 12.0–46.0)
Lymphs Abs: 1.9 10*3/uL (ref 0.7–4.0)
MCHC: 32.3 g/dL (ref 30.0–36.0)
MCV: 91.2 fl (ref 78.0–100.0)
Monocytes Absolute: 0.4 10*3/uL (ref 0.1–1.0)
Monocytes Relative: 8.8 % (ref 3.0–12.0)
Neutro Abs: 2 10*3/uL (ref 1.4–7.7)
Neutrophils Relative %: 44.8 % (ref 43.0–77.0)
Platelets: 198 10*3/uL (ref 150.0–400.0)
RBC: 3.91 Mil/uL (ref 3.87–5.11)
RDW: 13.5 % (ref 11.5–15.5)
WBC: 4.5 10*3/uL (ref 4.0–10.5)

## 2021-08-24 LAB — TSH: TSH: 1.74 u[IU]/mL (ref 0.35–5.50)

## 2021-08-24 LAB — VITAMIN D 25 HYDROXY (VIT D DEFICIENCY, FRACTURES): VITD: 43.43 ng/mL (ref 30.00–100.00)

## 2021-08-24 MED ORDER — PHENAZOPYRIDINE HCL 200 MG PO TABS: 200.0000 mg | ORAL_TABLET | Freq: Three times a day (TID) | ORAL | 0 refills | Status: DC | PRN

## 2021-08-24 MED ORDER — AMOXICILLIN-POT CLAVULANATE 875-125 MG PO TABS: 1.0000 | ORAL_TABLET | Freq: Two times a day (BID) | ORAL | 0 refills | Status: DC

## 2021-08-24 NOTE — Patient Instructions (Signed)
Take probiotics yogurt or over the counter align or culturelle  ?Increase water intake  ? ?Sent augmentin (antibiotic) and pyridium for burning ? ?Urinary Tract Infection, Adult ?A urinary tract infection (UTI) is an infection of any part of the urinary tract. The urinary tract includes the kidneys, ureters, bladder, and urethra. These organs make, store, and get rid of urine in the body. ?An upper UTI affects the ureters and kidneys. A lower UTI affects the bladder and urethra. ?What are the causes? ?Most urinary tract infections are caused by bacteria in your genital area around your urethra, where urine leaves your body. These bacteria grow and cause inflammation of your urinary tract. ?What increases the risk? ?You are more likely to develop this condition if: ?You have a urinary catheter that stays in place. ?You are not able to control when you urinate or have a bowel movement (incontinence). ?You are female and you: ?Use a spermicide or diaphragm for birth control. ?Have low estrogen levels. ?Are pregnant. ?You have certain genes that increase your risk. ?You are sexually active. ?You take antibiotic medicines. ?You have a condition that causes your flow of urine to slow down, such as: ?An enlarged prostate, if you are female. ?Blockage in your urethra. ?A kidney stone. ?A nerve condition that affects your bladder control (neurogenic bladder). ?Not getting enough to drink, or not urinating often. ?You have certain medical conditions, such as: ?Diabetes. ?A weak disease-fighting system (immunesystem). ?Sickle cell disease. ?Gout. ?Spinal cord injury. ?What are the signs or symptoms? ?Symptoms of this condition include: ?Needing to urinate right away (urgency). ?Frequent urination. This may include small amounts of urine each time you urinate. ?Pain or burning with urination. ?Blood in the urine. ?Urine that smells bad or unusual. ?Trouble urinating. ?Cloudy urine. ?Vaginal discharge, if you are female. ?Pain in  the abdomen or the lower back. ?You may also have: ?Vomiting or a decreased appetite. ?Confusion. ?Irritability or tiredness. ?A fever or chills. ?Diarrhea. ?The first symptom in older adults may be confusion. In some cases, they may not have any symptoms until the infection has worsened. ?How is this diagnosed? ?This condition is diagnosed based on your medical history and a physical exam. You may also have other tests, including: ?Urine tests. ?Blood tests. ?Tests for STIs (sexually transmitted infections). ?If you have had more than one UTI, a cystoscopy or imaging studies may be done to determine the cause of the infections. ?How is this treated? ?Treatment for this condition includes: ?Antibiotic medicine. ?Over-the-counter medicines to treat discomfort. ?Drinking enough water to stay hydrated. ?If you have frequent infections or have other conditions such as a kidney stone, you may need to see a health care provider who specializes in the urinary tract (urologist). ?In rare cases, urinary tract infections can cause sepsis. Sepsis is a life-threatening condition that occurs when the body responds to an infection. Sepsis is treated in the hospital with IV antibiotics, fluids, and other medicines. ?Follow these instructions at home: ?Medicines ?Take over-the-counter and prescription medicines only as told by your health care provider. ?If you were prescribed an antibiotic medicine, take it as told by your health care provider. Do not stop using the antibiotic even if you start to feel better. ?General instructions ?Make sure you: ?Empty your bladder often and completely. Do not hold urine for long periods of time. ?Empty your bladder after sex. ?Wipe from front to back after urinating or having a bowel movement if you are female. Use each tissue only one time  when you wipe. ?Drink enough fluid to keep your urine pale yellow. ?Keep all follow-up visits. This is important. ?Contact a health care provider if: ?Your  symptoms do not get better after 1-2 days. ?Your symptoms go away and then return. ?Get help right away if: ?You have severe pain in your back or your lower abdomen. ?You have a fever or chills. ?You have nausea or vomiting. ?Summary ?A urinary tract infection (UTI) is an infection of any part of the urinary tract, which includes the kidneys, ureters, bladder, and urethra. ?Most urinary tract infections are caused by bacteria in your genital area. ?Treatment for this condition often includes antibiotic medicines. ?If you were prescribed an antibiotic medicine, take it as told by your health care provider. Do not stop using the antibiotic even if you start to feel better. ?Keep all follow-up visits. This is important. ?This information is not intended to replace advice given to you by your health care provider. Make sure you discuss any questions you have with your health care provider. ?Document Revised: 01/22/2020 Document Reviewed: 01/22/2020 ?Elsevier Patient Education ? [place]. ? ?

## 2021-08-24 NOTE — Progress Notes (Signed)
Chief Complaint  ?Patient presents with  ? Urinary Tract Infection  ? ?F/u  ?1. C/o urinary freq and burning and low back pain urine culture 08/07/21 negative she does have h/o of uti pain 7/10  ?Appt w urology 08/30/21  ? ? ?Review of Systems  ?Genitourinary:  Positive for dysuria and frequency.  ?Past Medical History:  ?Diagnosis Date  ? Allergy   ? allergy shots (Juengel)  ? Anxiety   ? Arthritis   ? Asthma   ? CAD (coronary artery disease)   ? cath 11/27/04 - moderated LAD disease  ? Cancer [facility])   ? skin  ? GERD (gastroesophageal reflux disease)   ? EGD (05/25/04) - slight presbyesophagus, mild esophagitis, mild gastritis, mild duodenitis - positive H. pylori.   ? Neuropathy   ? ?Past Surgical History:  ?Procedure Laterality Date  ? ABDOMINAL HYSTERECTOMY    ? APPENDECTOMY  1967  ? COLONOSCOPY WITH PROPOFOL N/A 11/16/2019  ? Procedure: COLONOSCOPY WITH PROPOFOL;  Surgeon: Lin Landsman, MD;  Location: [facility] ENDOSCOPY;  Service: Gastroenterology;  Laterality: N/A;  ? ESOPHAGOGASTRODUODENOSCOPY (EGD) WITH PROPOFOL N/A 11/16/2019  ? Procedure: ESOPHAGOGASTRODUODENOSCOPY (EGD) WITH PROPOFOL;  Surgeon: Lin Landsman, MD;  Location: [facility] ENDOSCOPY;  Service: Gastroenterology;  Laterality: N/A;  ? ?Family History  ?Problem Relation Age of Onset  ? Arthritis Mother   ? Stroke Sister   ? Breast cancer Neg Hx   ? ?Social History  ? ?Socioeconomic History  ? Marital status: Married  ?  Spouse name: Not on file  ? Number of children: Not on file  ? Years of education: Not on file  ? Highest education level: Not on file  ?Occupational History  ? Not on file  ?Tobacco Use  ? Smoking status: Never  ? Smokeless tobacco: Never  ?Vaping Use  ? Vaping Use: Never used  ?Substance and Sexual Activity  ? Alcohol use: No  ?  Alcohol/week: 0.0 standard drinks  ? Drug use: No  ? Sexual activity: Not on file  ?Other Topics Concern  ? Not on file  ?Social History Narrative  ? Not on file  ? ?Social Determinants of Health  ? ?Financial  Resource Strain: Not on file  ?Food Insecurity: Not on file  ?Transportation Needs: Not on file  ?Physical Activity: Not on file  ?Stress: Not on file  ?Social Connections: Not on file  ?Intimate Partner Violence: Not on file  ? ?Current Meds  ?Medication Sig  ? ALPRAZolam (XANAX) 0.25 MG tablet TAKE ONE TABLET EVERY DAY AS NEEDED FOR ANXIETY  ? amoxicillin-clavulanate (AUGMENTIN) 875-125 MG tablet Take 1 tablet by mouth 2 (two) times daily. With food  ? Ascorbic Acid (VITAMIN C PO) Take by mouth daily.  ? aspirin 81 MG tablet Take 81 mg by mouth daily.  ? Calcium Carbonate-Vit D-Min (CALCIUM 1200 PO) Take by mouth daily.  ? celecoxib (CELEBREX) 200 MG capsule Take 1 capsule (200 mg total) by mouth daily.  ? COD LIVER OIL PO Take by mouth.  ? docusate sodium (COLACE) 100 MG capsule Take by mouth.  ? fexofenadine (ALLEGRA) 180 MG tablet Take 1 tablet (180 mg total) by mouth daily as needed for allergies or rhinitis.  ? FLUoxetine (PROZAC) 20 MG capsule TAKE 1 CAPSULE BY MOUTH ONCE DAILY  ? mometasone (ELOCON) 0.1 % lotion Apply 1 application topically as directed.  ? Multiple Vitamin (MULTIVITAMIN) tablet Take 1 tablet by mouth daily.  ? Omega-3 Fatty Acids (FISH OIL PO) Take by mouth  daily.  ? phenazopyridine (PYRIDIUM) 200 MG tablet Take 1 tablet (200 mg total) by mouth 3 (three) times daily as needed for pain (for burning).  ? Probiotic Product (PROBIOTIC PO) Take by mouth.  ? silver sulfADIAZINE (SILVADENE) 1 % cream Apply 1 application topically daily.  ? triamcinolone cream (KENALOG) 0.1 %   ? VITAMIN D PO Take by mouth daily.  ? [DISCONTINUED] cephALEXin (KEFLEX) 500 MG capsule Take 1 capsule (500 mg total) by mouth 4 (four) times daily.  ? ?Allergies  ?Allergen Reactions  ? Mushroom Extract Complex Hives  ?  All Mushrooms   ? ?Recent Results (from the past 2160 hour(s))  ?POCT urinalysis dipstick     Status: Abnormal  ? Collection Time: 08/07/21 11:21 AM  ?Result Value Ref Range  ? Color, UA yellow yellow  ?  Clarity, UA cloudy (A) clear  ? Glucose, UA negative negative mg/dL  ? Bilirubin, UA moderate (A) negative  ? Ketones, POC UA small (15) (A) negative mg/dL  ? Spec Grav, UA >=1.030 (A) 1.010 - 1.025  ? Blood, UA large (A) negative  ? pH, UA 5.5 5.0 - 8.0  ? Protein Ur, POC >=300 (A) negative mg/dL  ? Urobilinogen, UA 0.2 0.2 or 1.0 E.U./dL  ? Nitrite, UA Negative Negative  ? Leukocytes, UA Trace (A) Negative  ?Urine Culture     Status: None  ? Collection Time: 08/07/21 11:27 AM  ? Specimen: Urine  ?Result Value Ref Range  ? MICRO NUMBER: [id number]   ? SPECIMEN QUALITY: Adequate   ? Sample Source NOT GIVEN   ? STATUS: FINAL   ? ISOLATE 1:    ?  Mixed genital flora isolated. These superficial bacteria are not indicative of a urinary tract infection. No further organism identification is warranted on this specimen. If clinically indicated, recollect clean-catch, mid-stream urine and transfer  ?immediately to Urine Culture Transport Tube. ?  ?Urine Microscopic     Status: Abnormal  ? Collection Time: 08/07/21 11:29 AM  ?Result Value Ref Range  ? WBC, UA TNTC(>50/hpf) (A) 0-2/hpf  ? RBC / HPF 3-6/hpf (A) 0-2/hpf  ? Mucus, UA Presence of (A) None  ? Squamous Epithelial / LPF Rare(0-4/hpf) Rare(0-4/hpf)  ? Renal Epithel, UA Rare(0-4/hpf) (A) None  ? Bacteria, UA Rare(<10/hpf) (A) None  ? Amorphous Present (A) None;Present  ? ?Objective  ?Body mass index is 21.47 kg/m?. ?Wt Readings from Last 3 Encounters:  ?08/24/21 129 lb (58.5 kg)  ?08/07/21 127 lb 12.8 oz (58 kg)  ?10/04/20 131 lb 9.6 oz (59.7 kg)  ? ?Temp Readings from Last 3 Encounters:  ?08/24/21 98.3 ?F (36.8 ?C) (Oral)  ?08/07/21 98.8 ?F (37.1 ?C) (Oral)  ?10/04/20 98.2 ?F (36.8 ?C)  ? ?BP Readings from Last 3 Encounters:  ?08/24/21 110/74  ?08/07/21 120/80  ?10/09/20 138/78  ? ?Pulse Readings from Last 3 Encounters:  ?08/24/21 77  ?08/07/21 86  ?10/04/20 84  ? ? ?Physical Exam ?Vitals and nursing note reviewed.  ?Constitutional:   ?   Appearance: Normal appearance.  She is well-developed and well-groomed.  ?HENT:  ?   Head: Normocephalic and atraumatic.  ?Eyes:  ?   Conjunctiva/sclera: Conjunctivae normal.  ?   Pupils: Pupils are equal, round, and reactive to light.  ?Cardiovascular:  ?   Rate and Rhythm: Normal rate and regular rhythm.  ?   Heart sounds: Normal heart sounds. No murmur heard. ?Pulmonary:  ?   Effort: Pulmonary effort is normal.  ?   Breath sounds:  Normal breath sounds.  ?Abdominal:  ?   General: Abdomen is flat. Bowel sounds are normal.  ?   Tenderness: There is abdominal tenderness.  ?Musculoskeletal:     ?   General: No tenderness.  ?Skin: ?   General: Skin is warm and dry.  ?Neurological:  ?   General: No focal deficit present.  ?   Mental Status: She is alert and oriented to person, place, and time. Mental status is at baseline.  ?   Cranial Nerves: Cranial nerves 2-12 are intact.  ?   Motor: Motor function is intact.  ?   Coordination: Coordination is intact.  ?   Gait: Gait is intact.  ?Psychiatric:     ?   Attention and Perception: Attention and perception normal.     ?   Mood and Affect: Mood and affect normal.     ?   Speech: Speech normal.     ?   Behavior: Behavior normal. Behavior is cooperative.     ?   Thought Content: Thought content normal.     ?   Cognition and Memory: Cognition and memory normal.     ?   Judgment: Judgment normal.  ? ? ?Assessment  ?Plan  ?UTI symptoms - Plan: Urinalysis, Urine Culture, phenazopyridine (PYRIDIUM) 200 MG tablet, amoxicillin-clavulanate (AUGMENTIN) 875-125 MG tablet ? ?History of UTI - Plan: Urinalysis, Urine Culture, phenazopyridine (PYRIDIUM) 200 MG tablet, amoxicillin-clavulanate (AUGMENTIN) 875-125 MG tablet ?F/u Dr. Bernardo Heater 08/30/21  ? ? ?Hm ?F/u PCP in 4-6 weeks  ? ?Provider: Dr. Olivia Mackie McLean-Scocuzza-[facility]  ?

## 2021-08-26 LAB — URINALYSIS
Bilirubin Urine: NEGATIVE
Glucose, UA: NEGATIVE
Hgb urine dipstick: NEGATIVE
Nitrite: NEGATIVE
Specific Gravity, Urine: 1.026 (ref 1.001–1.035)
pH: 5.5 (ref 5.0–8.0)

## 2021-08-26 LAB — URINE CULTURE
MICRO NUMBER:: 13079711
SPECIMEN QUALITY:: ADEQUATE

## 2021-08-30 ENCOUNTER — Encounter: Payer: Self-pay | Admitting: Urology

## 2021-08-30 ENCOUNTER — Other Ambulatory Visit: Payer: Self-pay

## 2021-08-30 ENCOUNTER — Ambulatory Visit: Payer: Medicare Other | Admitting: Urology

## 2021-08-30 VITALS — BP 151/85 | HR 84 | Ht 61.0 in | Wt 120.0 lb

## 2021-08-30 DIAGNOSIS — R3129 Other microscopic hematuria: Secondary | ICD-10-CM

## 2021-08-30 DIAGNOSIS — R310 Gross hematuria: Secondary | ICD-10-CM

## 2021-08-30 DIAGNOSIS — R399 Unspecified symptoms and signs involving the genitourinary system: Secondary | ICD-10-CM | POA: Diagnosis not present

## 2021-08-30 DIAGNOSIS — R8281 Pyuria: Secondary | ICD-10-CM | POA: Diagnosis not present

## 2021-08-30 DIAGNOSIS — R300 Dysuria: Secondary | ICD-10-CM | POA: Diagnosis not present

## 2021-08-30 LAB — MICROSCOPIC EXAMINATION: Bacteria, UA: NONE SEEN

## 2021-08-30 LAB — URINALYSIS, COMPLETE
Bilirubin, UA: NEGATIVE
Glucose, UA: NEGATIVE
Nitrite, UA: NEGATIVE
Specific Gravity, UA: 1.025 (ref 1.005–1.030)
Urobilinogen, Ur: 0.2 mg/dL (ref 0.2–1.0)
pH, UA: 6 (ref 5.0–7.5)

## 2021-08-30 LAB — BLADDER SCAN AMB NON-IMAGING: Scan Result: 1

## 2021-08-30 MED ORDER — MIRABEGRON ER 25 MG PO TB24: 25.0000 mg | ORAL_TABLET | Freq: Every day | ORAL | 0 refills | Status: DC

## 2021-08-30 NOTE — Patient Instructions (Signed)

## 2021-08-31 ENCOUNTER — Encounter: Payer: Self-pay | Admitting: Urology

## 2021-08-31 NOTE — Progress Notes (Signed)
? ?08/30/2021 ?8:47 PM  ? ?Patricia Pacheco ?08-Nov-1940 ?[id number] ? ?Referring provider: Einar Pheasant, MD ?[address] ?[address] ?[place],  [place] [address] ? ?Chief Complaint  ?Patient presents with  ? Hematuria  ? ? ?HPI: ?Patricia Pacheco is a 81 y.o. female who presents for evaluation of abnormal urinalysis and lower urinary tract symptoms. ? ?Over the last 2 months and has had recurrent voiding symptoms including frequency, urgency and dysuria ?Denies flank, abdominal or pelvic pain ?No gross hematuria ?Symptoms have improved with antibiotic therapy however return ?UA 08/07/2021 showed large blood on dipstick with trace leukocytes, TNTC WBCs; urine culture grew mixed flora ?UA dipstick 08/24/2021 was turbid with 2+ leukocytes; urine culture again grew mixed flora ? ? ?PMH: ?Past Medical History:  ?Diagnosis Date  ? Allergy   ? allergy shots ([facility]engel)  ? Anxiety   ? Arthritis   ? Asthma   ? CAD (coronary artery disease)   ? cath 11/27/04 - moderated LAD disease  ? Cancer [facility])   ? skin  ? GERD (gastroesophageal reflux disease)   ? EGD (05/25/04) - slight presbyesophagus, mild esophagitis, mild gastritis, mild duodenitis - positive H. pylori.   ? Neuropathy   ? ? ?Surgical History: ?Past Surgical History:  ?Procedure Laterality Date  ? ABDOMINAL HYSTERECTOMY    ? APPENDECTOMY  1967  ? COLONOSCOPY WITH PROPOFOL N/A 11/16/2019  ? Procedure: COLONOSCOPY WITH PROPOFOL;  Surgeon: Lin Landsman, MD;  Location: [facility] ENDOSCOPY;  Service: Gastroenterology;  Laterality: N/A;  ? ESOPHAGOGASTRODUODENOSCOPY (EGD) WITH PROPOFOL N/A 11/16/2019  ? Procedure: ESOPHAGOGASTRODUODENOSCOPY (EGD) WITH PROPOFOL;  Surgeon: Lin Landsman, MD;  Location: [facility] ENDOSCOPY;  Service: Gastroenterology;  Laterality: N/A;  ? ? ?Home Medications:  ?Allergies as of 08/30/2021   ? ?   Reactions  ? Mushroom Extract Complex Hives  ? All Mushrooms   ? ?  ? ?  ?Medication List  ?  ? ?  ? Accurate as of August 30, 2021 11:59 PM. If you have any  questions, ask your nurse or doctor.  ?  ?  ? ?  ? ?ALPRAZolam 0.25 MG tablet ?Commonly known as: Duanne Moron ?TAKE ONE TABLET EVERY DAY AS NEEDED FOR ANXIETY ?  ?amoxicillin-clavulanate 875-125 MG tablet ?Commonly known as: Augmentin ?Take 1 tablet by mouth 2 (two) times daily. With food ?  ?aspirin 81 MG tablet ?Take 81 mg by mouth daily. ?  ?CALCIUM 1200 PO ?Take by mouth daily. ?  ?celecoxib 200 MG capsule ?Commonly known as: CeleBREX ?Take 1 capsule (200 mg total) by mouth daily. ?  ?COD LIVER OIL PO ?Take by mouth. ?  ?docusate sodium 100 MG capsule ?Commonly known as: COLACE ?Take by mouth. ?  ?fexofenadine 180 MG tablet ?Commonly known as: ALLEGRA ?Take 1 tablet (180 mg total) by mouth daily as needed for allergies or rhinitis. ?  ?FISH OIL PO ?Take by mouth daily. ?  ?FLUoxetine 20 MG capsule ?Commonly known as: PROZAC ?TAKE 1 CAPSULE BY MOUTH ONCE DAILY ?  ?hydrochlorothiazide 12.5 MG capsule ?Commonly known as: MICROZIDE ?TAKE 1 CAPSULE BY MOUTH DAILY. ?  ?mirabegron ER 25 MG Tb24 tablet ?Commonly known as: MYRBETRIQ ?Take 1 tablet (25 mg total) by mouth daily. ?Started by: Abbie Sons, MD ?  ?mometasone 0.1 % lotion ?Commonly known as: ELOCON ?Apply 1 application topically as directed. ?  ?multivitamin tablet ?Take 1 tablet by mouth daily. ?  ?omeprazole 40 MG capsule ?Commonly known as: PRILOSEC ?TAKE 1 CAPSULE BY MOUTH ONCE DAILY ?  ?  phenazopyridine 200 MG tablet ?Commonly known as: Pyridium ?Take 1 tablet (200 mg total) by mouth 3 (three) times daily as needed for pain (for burning). ?  ?ProAir HFA 108 (90 Base) MCG/ACT inhaler ?Generic drug: albuterol ?  ?PROBIOTIC PO ?Take by mouth. ?  ?silver sulfADIAZINE 1 % cream ?Commonly known as: Silvadene ?Apply 1 application topically daily. ?  ?triamcinolone cream 0.1 % ?Commonly known as: KENALOG ?  ?VITAMIN C PO ?Take by mouth daily. ?  ?VITAMIN D PO ?Take by mouth daily. ?  ? ?  ? ? ?Allergies:  ?Allergies  ?Allergen Reactions  ? Mushroom Extract Complex  Hives  ?  All Mushrooms   ? ? ?Family History: ?Family History  ?Problem Relation Age of Onset  ? Arthritis Mother   ? Stroke Sister   ? Breast cancer Neg Hx   ? ? ?Social History:  reports that she has never smoked. She has never used smokeless tobacco. She reports that she does not drink alcohol and does not use drugs. ? ? ?Physical Exam: ?BP (!) 151/85   Pulse 84   Ht '5\' 1"'$  (1.549 m)   Wt 120 lb (54.4 kg)   LMP 08/29/1995   BMI 22.67 kg/m?   ?Constitutional:  Alert and oriented, No acute distress. ?HEENT: [place] AT, moist mucus membranes.  Trachea midline, no masses. ?Cardiovascular: No clubbing, cyanosis, or edema. ?Respiratory: Normal respiratory effort, no increased work of breathing. ?Psychiatric: Normal mood and affect. ? ?Laboratory Data: ? ?Urinalysis ?Microscopy 6-10 WBC ? ? ?Assessment & Plan:   ?81 y.o. female with recurrent dysuria and storage related voiding symptoms ?UAs have shown significant pyuria but urine cultures with insignificant growth ?UA today with 6-10 WBCs.  Urine culture ordered ?Recommended further evaluation with renal ultrasound and cystoscopy.  The procedures were discussed and she has elected to schedule. ?Trial Myrbetriq 25 mg for her frequency/urgency ? ? ?Abbie Sons, MD ? ?[place] ?[address], [address] ?[place], [place] [address] ?[phone] ? ?

## 2021-09-05 ENCOUNTER — Encounter: Payer: Self-pay | Admitting: Adult Health

## 2021-09-05 ENCOUNTER — Ambulatory Visit (INDEPENDENT_AMBULATORY_CARE_PROVIDER_SITE_OTHER): Payer: Medicare Other | Admitting: Adult Health

## 2021-09-05 ENCOUNTER — Other Ambulatory Visit: Payer: Self-pay

## 2021-09-05 VITALS — BP 120/68 | HR 79 | Temp 98.0°F | Ht 61.0 in | Wt 130.4 lb

## 2021-09-05 DIAGNOSIS — H6501 Acute serous otitis media, right ear: Secondary | ICD-10-CM

## 2021-09-05 DIAGNOSIS — H6121 Impacted cerumen, right ear: Secondary | ICD-10-CM | POA: Diagnosis not present

## 2021-09-05 DIAGNOSIS — H60392 Other infective otitis externa, left ear: Secondary | ICD-10-CM

## 2021-09-05 DIAGNOSIS — H9201 Otalgia, right ear: Secondary | ICD-10-CM | POA: Insufficient documentation

## 2021-09-05 DIAGNOSIS — H6090 Unspecified otitis externa, unspecified ear: Secondary | ICD-10-CM | POA: Insufficient documentation

## 2021-09-05 LAB — CULTURE, URINE COMPREHENSIVE

## 2021-09-05 MED ORDER — NEOMYCIN-POLYMYXIN-HC 1 % OT SOLN: OTIC | 0 refills | Status: DC

## 2021-09-05 MED ORDER — AMOXICILLIN 875 MG PO TABS: 875.0000 mg | ORAL_TABLET | Freq: Two times a day (BID) | ORAL | 0 refills | Status: AC

## 2021-09-05 NOTE — Progress Notes (Signed)
? ?Acute Office Visit ? ?Subjective:  ? ? Patient ID: Patricia Pacheco, female    DOB: April 27, 1941, 81 y.o.   MRN: [id number] ? ?Chief Complaint  ?Patient presents with  ? Follow-up  ?  Pt c/o ear pain for over a month. Pt states feels pain inside the ear, feels deep down.  ? ? ?HPI ?Patient is in today for pain in right ear.  She denies any ear drainage or ear trauma. ? ?Denies any drainage.  ?Not been wearing hearing aide as much because of pain.  ?Denies any cold symptoms. Just has allergy.  ?Denies any headaches.  ?Denies any left pain.  She is taking Allegra once a day for her nasal allergies.  95 degrees ? ?Treated with Augmentin for UTI in march. She denies any urinary symptoms currently. ?Denies any exposures to any known illnesses. ? ?Patient  denies any fever, body aches,chills, rash, chest pain, shortness of breath, nausea, vomiting, or diarrhea.  ?Denies dizziness, lightheadedness, pre syncopal or syncopal episodes.   ? ? ?Past Medical History:  ?Diagnosis Date  ? Allergy   ? allergy shots ([facility])  ? Anxiety   ? Arthritis   ? Asthma   ? CAD (coronary artery disease)   ? cath 11/27/04 - moderated LAD disease  ? [facility])   ? skin  ? GERD (gastroesophageal reflux disease)   ? EGD (05/25/04) - slight presbyesophagus, mild esophagitis, mild gastritis, mild duodenitis - positive H. pylori.   ? Neuropathy   ? ? ?Past Surgical History:  ?Procedure Laterality Date  ? ABDOMINAL HYSTERECTOMY    ? APPENDECTOMY  1967  ? COLONOSCOPY WITH PROPOFOL N/A 11/16/2019  ? Procedure: COLONOSCOPY WITH PROPOFOL;  Surgeon: Lin Landsman, MD;  Location: [facility] ENDOSCOPY;  Service: Gastroenterology;  Laterality: N/A;  ? ESOPHAGOGASTRODUODENOSCOPY (EGD) WITH PROPOFOL N/A 11/16/2019  ? Procedure: ESOPHAGOGASTRODUODENOSCOPY (EGD) WITH PROPOFOL;  Surgeon: Lin Landsman, MD;  Location: [facility] ENDOSCOPY;  Service: Gastroenterology;  Laterality: N/A;  ? ? ?Family History  ?Problem Relation Age of Onset  ? Arthritis Mother   ? Stroke  Sister   ? Breast cancer Neg Hx   ? ? ?Social History  ? ?Socioeconomic History  ? Marital status: Married  ?  Spouse name: Not on file  ? Number of children: Not on file  ? Years of education: Not on file  ? Highest education level: Not on file  ?Occupational History  ? Not on file  ?Tobacco Use  ? Smoking status: Never  ? Smokeless tobacco: Never  ?Vaping Use  ? Vaping Use: Never used  ?Substance and Sexual Activity  ? Alcohol use: No  ?  Alcohol/week: 0.0 standard drinks  ? Drug use: No  ? Sexual activity: Not on file  ?Other Topics Concern  ? Not on file  ?Social History Narrative  ? Not on file  ? ?Social Determinants of Health  ? ?Financial Resource Strain: Not on file  ?Food Insecurity: Not on file  ?Transportation Needs: Not on file  ?Physical Activity: Not on file  ?Stress: Not on file  ?Social Connections: Not on file  ?Intimate Partner Violence: Not on file  ? ? ?Outpatient Medications Prior to Visit  ?Medication Sig Dispense Refill  ? ALPRAZolam (XANAX) 0.25 MG tablet TAKE ONE TABLET EVERY DAY AS NEEDED FOR ANXIETY 30 tablet 0  ? Ascorbic Acid (VITAMIN C PO) Take by mouth daily.    ? aspirin 81 MG tablet Take 81 mg by mouth daily.    ?  Calcium Carbonate-Vit D-Min (CALCIUM 1200 PO) Take by mouth daily.    ? celecoxib (CELEBREX) 200 MG capsule Take 1 capsule (200 mg total) by mouth daily. 90 capsule 3  ? COD LIVER OIL PO Take by mouth.    ? docusate sodium (COLACE) 100 MG capsule Take by mouth.    ? fexofenadine (ALLEGRA) 180 MG tablet Take 1 tablet (180 mg total) by mouth daily as needed for allergies or rhinitis. 30 tablet 0  ? FLUoxetine (PROZAC) 20 MG capsule TAKE 1 CAPSULE BY MOUTH ONCE DAILY 30 capsule 0  ? hydrochlorothiazide (MICROZIDE) 12.5 MG capsule TAKE 1 CAPSULE BY MOUTH DAILY. 30 capsule 2  ? mirabegron ER (MYRBETRIQ) 25 MG TB24 tablet Take 1 tablet (25 mg total) by mouth daily. 30 tablet 0  ? mometasone (ELOCON) 0.1 % lotion Apply 1 application topically as directed.    ? Multiple Vitamin  (MULTIVITAMIN) tablet Take 1 tablet by mouth daily.    ? Omega-3 Fatty Acids (FISH OIL PO) Take by mouth daily.    ? omeprazole (PRILOSEC) 40 MG capsule TAKE 1 CAPSULE BY MOUTH ONCE DAILY 30 capsule 5  ? PROAIR HFA 108 (90 Base) MCG/ACT inhaler     ? Probiotic Product (PROBIOTIC PO) Take by mouth.    ? silver sulfADIAZINE (SILVADENE) 1 % cream Apply 1 application topically daily. 50 g 0  ? triamcinolone cream (KENALOG) 0.1 %     ? VITAMIN D PO Take by mouth daily.    ? amoxicillin-clavulanate (AUGMENTIN) 875-125 MG tablet Take 1 tablet by mouth 2 (two) times daily. With food 14 tablet 0  ? phenazopyridine (PYRIDIUM) 200 MG tablet Take 1 tablet (200 mg total) by mouth 3 (three) times daily as needed for pain (for burning). 10 tablet 0  ? ?No facility-administered medications prior to visit.  ? ? ?Allergies  ?Allergen Reactions  ? Mushroom Extract Complex Hives  ?  All Mushrooms   ? ? ?Review of Systems  ?Constitutional: Negative.   ?HENT:  Positive for ear pain and rhinorrhea (chronic she reports with allergies no change. clear bilateral nostril, no salty or metal taste, no injury.). Negative for congestion, dental problem, drooling, ear discharge, facial swelling, hearing loss, mouth sores, nosebleeds, postnasal drip, sinus pressure, sinus pain, sneezing, sore throat, tinnitus, trouble swallowing and voice change.   ?Eyes: Negative.   ?Respiratory: Negative.    ?Cardiovascular: Negative.   ?Gastrointestinal: Negative.   ?Genitourinary: Negative.   ?Musculoskeletal: Negative.   ?Allergic/Immunologic: Negative.   ?Neurological: Negative.   ?Hematological: Negative.   ?Psychiatric/Behavioral: Negative.    ? ?   ?Objective:  ?  ?Physical Exam ?Vitals reviewed.  ?Constitutional:   ?   General: She is not in acute distress. ?   Appearance: Normal appearance. She is not ill-appearing, toxic-appearing or diaphoretic.  ?HENT:  ?   Head: Normocephalic and atraumatic.  ?   Jaw: There is normal jaw occlusion.  ?   Salivary  Glands: Right salivary gland is not diffusely enlarged or tender. Left salivary gland is not diffusely enlarged or tender.  ?   Right Ear: Hearing and external ear normal. Swelling and tenderness present. A middle ear effusion is present. There is impacted cerumen. No foreign body. No mastoid tenderness. Tympanic membrane is erythematous. Tympanic membrane is not perforated or retracted.  ?   Left Ear: Hearing and external ear normal. Swelling and tenderness present. A middle ear effusion is present. There is no impacted cerumen. No foreign body. No mastoid tenderness. Tympanic  membrane is not erythematous or retracted.  ?   Ears:  ?   Comments: Right ear with cerumen impaction, unable to visualize the tympanic membrane or canal. Ear irrigation ordered and performed by Kerin Salen RN and Jake Michaelis CMA without difficulty however she did develop some mild pain in right ear.  ?Provider rechecked ear was not able to use curette to remove cerumen, however provider was able to visualize intact tympanic membrane that is erythematous and dark serous fluid behind membrane of right ear as cerumen was moved with irrigation. Right ear canal is tender and erythematous and mildly swollen, tender tragal pull as well.  ?   Nose: Rhinorrhea present. No congestion.  ?   Mouth/Throat:  ?   Mouth: Mucous membranes are moist.  ?   Pharynx: Oropharynx is clear. No oropharyngeal exudate.  ?Eyes:  ?   General: No scleral icterus.    ?   Right eye: No discharge.     ?   Left eye: No discharge.  ?   Extraocular Movements: Extraocular movements intact.  ?   Conjunctiva/sclera: Conjunctivae normal.  ?   Pupils: Pupils are equal, round, and reactive to light.  ?Cardiovascular:  ?   Rate and Rhythm: Normal rate and regular rhythm.  ?   Pulses: Normal pulses.  ?   Heart sounds: Normal heart sounds. No murmur heard. ?  No friction rub. No gallop.  ?Pulmonary:  ?   Effort: Pulmonary effort is normal. No respiratory distress.  ?   Breath  sounds: Normal breath sounds. No stridor. No wheezing, rhonchi or rales.  ?Chest:  ?   Chest wall: No tenderness.  ? ? ?BP 120/68 (BP Location: Left Arm, Patient Position: Sitting, Cuff Size: Small)   Pulse 79   T

## 2021-09-05 NOTE — Patient Instructions (Addendum)
?Meds ordered this encounter  ?Medications  ? NEOMYCIN-POLYMYXIN-HYDROCORTISONE (CORTISPORIN) 1 % SOLN OTIC solution  ?  Sig: 4 gtt in both ears tid, max of 10 days. Lie with affected ear upward x 5 minutes  ?  Dispense:  10 mL  ?  Refill:  0  ? amoxicillin (AMOXIL) 875 MG tablet  ?  Sig: Take 1 tablet (875 mg total) by mouth 2 (two) times daily for 10 days.  ?  Dispense:  20 tablet  ?  Refill:  0  ?  ?Start debrox after completing antibiotic drops if no pain. ?Follow up here next week or with ENT Dr. Pryor Ochoa.  ? ?Otitis Media, Adult ?Otitis media is a condition in which the middle ear is red and swollen (inflamed) and full of fluid. The middle ear is the part of the ear that contains bones for hearing as well as air that helps send sounds to the brain. The condition usually goes away on its own. ?What are the causes? ?This condition is caused by a blockage in the eustachian tube. This tube connects the middle ear to the back of the nose. It normally allows air into the middle ear. The blockage is caused by fluid or swelling. Problems that can cause blockage include: ?A cold or infection that affects the nose, mouth, or throat. ?Allergies. ?An irritant, such as tobacco smoke. ?Adenoids that have become large. The adenoids are soft tissue located in the back of the throat, behind the nose and the roof of the mouth. ?Growth or swelling in the upper part of the throat, just behind the nose (nasopharynx). ?Damage to the ear caused by a change in pressure. This is called barotrauma. ?What increases the risk? ?You are more likely to develop this condition if you: ?Smoke or are exposed to tobacco smoke. ?Have an opening in the roof of your mouth (cleft palate). ?Have acid reflux. ?Have problems in your body's defense system (immune system). ?What are the signs or symptoms? ?Symptoms of this condition include: ?Ear pain. ?Fever. ?Problems with hearing. ?Being tired. ?Fluid leaking from the ear. ?Ringing in the ear. ?How is  this treated? ?This condition can go away on its own within 3-5 days. But if the condition is caused by germs (bacteria) and does not go away on its own, or if it keeps coming back, your doctor may: ?Give you antibiotic medicines. ?Give you medicines for pain. ?Follow these instructions at home: ?Take over-the-counter and prescription medicines only as told by your doctor. ?If you were prescribed an antibiotic medicine, take it as told by your doctor. Do not stop taking it even if you start to feel better. ?Keep all follow-up visits. ?Contact a doctor if: ?You have bleeding from your nose. ?There is a lump on your neck. ?You are not feeling better in 5 days. ?You feel worse instead of better. ?Get help right away if: ?You have pain that is not helped with medicine. ?You have swelling, redness, or pain around your ear. ?You get a stiff neck. ?You cannot move part of your face (paralysis). ?You notice that the bone behind your ear hurts when you touch it. ?You get a very bad headache. ?Summary ?Otitis media means that the middle ear is red, swollen, and full of fluid. ?This condition usually goes away on its own. ?If the problem does not go away, treatment may be needed. You may be given medicines to treat the infection or to treat your pain. ?If you were prescribed an antibiotic medicine,  take it as told by your doctor. Do not stop taking it even if you start to feel better. ?Keep all follow-up visits. ?This information is not intended to replace advice given to you by your health care provider. Make sure you discuss any questions you have with your health care provider. ?Document Revised: 09/19/2020 Document Reviewed: 09/19/2020 ?Elsevier Patient Education ? 2022 [employer]. ? ? ? ?Otitis Externa ?Otitis externa is an infection of the outer ear canal. The outer ear canal is the area between the outside of the ear and the eardrum. Otitis externa is sometimes called swimmer's ear. ?What are the causes? ?Common  causes of this condition include: ?Swimming in dirty water. ?Moisture in the ear. ?An injury to the inside of the ear. ?An object stuck in the ear. ?A cut or scrape on the outside of the ear or in the ear canal. ?What increases the risk? ?You are more likely to get this condition if you go swimming often. ?What are the signs or symptoms? ?Itching in the ear. This is often the first symptom. ?Swelling of the ear. ?Redness in the ear. ?Ear pain. The pain may get worse when you pull on your ear. ?Pus coming from the ear. ?How is this treated? ?This condition may be treated with: ?Antibiotic ear drops. These are often given for 10-14 days. ?Medicines to reduce itching and swelling. ?Follow these instructions at home: ?If you were prescribed antibiotic ear drops, use them as told by your doctor. Do not stop using them even if you start to feel better. ?Take over-the-counter and prescription medicines only as told by your doctor. ?Avoid getting water in your ears as told by your doctor. You may be told to avoid swimming or water sports for a few days. ?Keep all follow-up visits. ?How is this prevented? ?Keep your ears dry. Use the corner of a towel to dry your ears after you swim or bathe. ?Try not to scratch or put things in your ear. Doing these things makes it easier for germs to grow in your ear. ?Avoid swimming in lakes, dirty water, or swimming pools that may not have the right amount of a chemical called chlorine. ?Contact a doctor if: ?You have a fever. ?Your ear is still red, swollen, or painful after 3 days. ?You still have pus coming from your ear after 3 days. ?Your redness, swelling, or pain gets worse. ?You have a very bad headache. ?Get help right away if: ?You have redness, swelling, and pain or tenderness behind your ear. ?Summary ?Otitis externa is an infection of the outer ear canal. ?Symptoms include pain, redness, and swelling of the ear. ?If you were prescribed antibiotic ear drops, use them as told  by your doctor. Do not stop using them even if you start to feel better. ?Try not to scratch or put things in your ear. ?This information is not intended to replace advice given to you by your health care provider. Make sure you discuss any questions you have with your health care provider. ?Document Revised: 08/24/2020 Document Reviewed: 08/24/2020 ?Elsevier Patient Education ? [employer]. ?Earwax Buildup, Adult ?The ears produce a substance called earwax that helps keep bacteria out of the ear and protects the skin in the ear canal. Occasionally, earwax can build up in the ear and cause discomfort or hearing loss. ?What are the causes? ?This condition is caused by a buildup of earwax. Ear canals are self-cleaning. Ear wax is made in the outer part of the ear  canal and generally falls out in small amounts over time. ?When the self-cleaning mechanism is not working, earwax builds up and can cause decreased hearing and discomfort. Attempting to clean ears with cotton swabs can push the earwax deep into the ear canal and cause decreased hearing and pain. ?What increases the risk? ?This condition is more likely to develop in people who: ?Clean their ears often with cotton swabs. ?Pick at their ears. ?Use earplugs or in-ear headphones often, or wear hearing aids. ?The following factors may also make you more likely to develop this condition: ?Being female. ?Being of older age. ?Naturally producing more earwax. ?Having narrow ear canals. ?Having earwax that is overly thick or sticky. ?Having excess hair in the ear canal. ?Having eczema. ?Being dehydrated. ?What are the signs or symptoms? ?Symptoms of this condition include: ?Reduced or muffled hearing. ?A feeling of fullness in the ear or feeling that the ear is plugged. ?Fluid coming from the ear. ?Ear pain or an itchy ear. ?Ringing in the ear. ?Coughing. ?Balance problems. ?An obvious piece of earwax that can be seen inside the ear canal. ?How is this  diagnosed? ?This condition may be diagnosed based on: ?Your symptoms. ?Your medical history. ?An ear exam. During the exam, your health care provider will look into your ear with an instrument called an otoscope. ?You

## 2021-09-08 ENCOUNTER — Other Ambulatory Visit: Payer: Self-pay | Admitting: *Deleted

## 2021-09-08 ENCOUNTER — Telehealth: Payer: Self-pay | Admitting: *Deleted

## 2021-09-08 DIAGNOSIS — H6123 Impacted cerumen, bilateral: Secondary | ICD-10-CM | POA: Diagnosis not present

## 2021-09-08 DIAGNOSIS — H903 Sensorineural hearing loss, bilateral: Secondary | ICD-10-CM | POA: Diagnosis not present

## 2021-09-08 MED ORDER — CIPROFLOXACIN HCL 250 MG PO TABS: 250.0000 mg | ORAL_TABLET | Freq: Two times a day (BID) | ORAL | 0 refills | Status: AC

## 2021-09-08 NOTE — Telephone Encounter (Signed)
-----   Message from Abbie Sons, MD sent at 09/07/2021 10:14 PM EDT ----- ?Urine culture grew a very low level of bacteria.  Would treat with Cipro 250 mg twice daily x5 days.  Follow-up as scheduled ?

## 2021-09-08 NOTE — Telephone Encounter (Signed)
Notified patient as instructed, patient pleased. Discussed follow-up appointments, patient agrees  

## 2021-09-11 ENCOUNTER — Telehealth: Payer: Self-pay | Admitting: Internal Medicine

## 2021-09-11 NOTE — Telephone Encounter (Signed)
Patient has appt on 3/23. Daughter wants to discuss memory change while at visit and do labs. Pt has been more forgetful lately and daughter feels like needs to be discussed. ?

## 2021-09-11 NOTE — Telephone Encounter (Signed)
Pt daughter called in requesting lab order for pt... No lab orders in system... Pt daughter would like labs due to pt memory lost... Pt daughter requesting callback at [phone]...  ?

## 2021-09-14 ENCOUNTER — Other Ambulatory Visit: Payer: Self-pay

## 2021-09-14 ENCOUNTER — Encounter: Payer: Self-pay | Admitting: Internal Medicine

## 2021-09-14 ENCOUNTER — Ambulatory Visit (INDEPENDENT_AMBULATORY_CARE_PROVIDER_SITE_OTHER): Payer: Medicare Other | Admitting: Internal Medicine

## 2021-09-14 VITALS — BP 116/78 | HR 62 | Temp 97.9°F | Ht 61.0 in | Wt 128.2 lb

## 2021-09-14 DIAGNOSIS — I10 Essential (primary) hypertension: Secondary | ICD-10-CM

## 2021-09-14 DIAGNOSIS — K219 Gastro-esophageal reflux disease without esophagitis: Secondary | ICD-10-CM

## 2021-09-14 DIAGNOSIS — H60392 Other infective otitis externa, left ear: Secondary | ICD-10-CM

## 2021-09-14 DIAGNOSIS — E7800 Pure hypercholesterolemia, unspecified: Secondary | ICD-10-CM

## 2021-09-14 DIAGNOSIS — F419 Anxiety disorder, unspecified: Secondary | ICD-10-CM

## 2021-09-14 DIAGNOSIS — D509 Iron deficiency anemia, unspecified: Secondary | ICD-10-CM | POA: Diagnosis not present

## 2021-09-14 DIAGNOSIS — R413 Other amnesia: Secondary | ICD-10-CM | POA: Diagnosis not present

## 2021-09-14 DIAGNOSIS — N3000 Acute cystitis without hematuria: Secondary | ICD-10-CM | POA: Diagnosis not present

## 2021-09-14 DIAGNOSIS — J4520 Mild intermittent asthma, uncomplicated: Secondary | ICD-10-CM

## 2021-09-14 DIAGNOSIS — Z9109 Other allergy status, other than to drugs and biological substances: Secondary | ICD-10-CM

## 2021-09-14 NOTE — Progress Notes (Signed)
Patient ID: Patricia Pacheco, female   DOB: [birth year], 81 y.o.   MRN: [id number] ? ? ?Subjective:  ? ? Patient ID: Patricia Pacheco, female    DOB: [birth year], 81 y.o.   MRN: [id number] ? ?This visit occurred during the SARS-CoV-2 public health emergency.  Safety protocols were in place, including screening questions prior to the visit, additional usage of staff PPE, and extensive cleaning of exam room while observing appropriate contact time as indicated for disinfecting solutions.  ? ?Patient here for a scheduled follow up.  ?Chief Complaint  ?Patient presents with  ? Follow-up  ?  F/u after impacted ear. Pt saw ENT - able to clear wax. Pt was put on antibiotics for double ear infection, right being the worst, but did not finish them. Pt daughter also wants to talk about patient's memory. Daughter states it has been getting very bad.   ? .  ? ?HPI ?She is accompanied by her daughter [name]).  History obtained from both of them.  Here with several concerns.  In reviewing, she has been seen recently for recurring urinary symptoms, including increased frequency, urgency and dysuria.  Has been treated with abx.  Last abx, she did not complete.  Saw Dr Bernardo Heater.  Recommended trial of myrbetriq and renal ultrasound and cystoscopy.  Also recently evaluated for her ear.  Diagnosed with otitis externa.  Treated with cortisporin otic.  Was also on oral abx. Saw ENT.  Ear wax removed.  No increased ear pain now.  No headache.  Concern regarding memory changes.  Daughter notes she also repeats herself.  Does described increased anxiety and worrying.  Husband has been sick.  This has put a big stress/strain on her.  Tries to stay active.  No chest pain or sob reported.  Eating.  No vomiting reported.  No bowel issues reported.  ? ? ?Past Medical History:  ?Diagnosis Date  ? Allergy   ? allergy shots (Juengel)  ? Anxiety   ? Arthritis   ? Asthma   ? CAD (coronary artery disease)   ? cath 11/27/04 - moderated LAD disease  ? Cancer [facility])    ? skin  ? GERD (gastroesophageal reflux disease)   ? EGD (05/25/04) - slight presbyesophagus, mild esophagitis, mild gastritis, mild duodenitis - positive H. pylori.   ? Neuropathy   ? ?Past Surgical History:  ?Procedure Laterality Date  ? ABDOMINAL HYSTERECTOMY    ? APPENDECTOMY  1967  ? COLONOSCOPY WITH PROPOFOL N/A 11/16/2019  ? Procedure: COLONOSCOPY WITH PROPOFOL;  Surgeon: Lin Landsman, MD;  Location: [facility] ENDOSCOPY;  Service: Gastroenterology;  Laterality: N/A;  ? ESOPHAGOGASTRODUODENOSCOPY (EGD) WITH PROPOFOL N/A 11/16/2019  ? Procedure: ESOPHAGOGASTRODUODENOSCOPY (EGD) WITH PROPOFOL;  Surgeon: Lin Landsman, MD;  Location: [facility] ENDOSCOPY;  Service: Gastroenterology;  Laterality: N/A;  ? ?Family History  ?Problem Relation Age of Onset  ? Arthritis Mother   ? Stroke Sister   ? Breast cancer Neg Hx   ? ?Social History  ? ?Socioeconomic History  ? Marital status: Married  ?  Spouse name: Not on file  ? Number of children: Not on file  ? Years of education: Not on file  ? Highest education level: Not on file  ?Occupational History  ? Not on file  ?Tobacco Use  ? Smoking status: Never  ? Smokeless tobacco: Never  ?Vaping Use  ? Vaping Use: Never used  ?Substance and Sexual Activity  ? Alcohol use: No  ?  Alcohol/week: 0.0 standard drinks  ?  Drug use: No  ? Sexual activity: Not on file  ?Other Topics Concern  ? Not on file  ?Social History Narrative  ? Not on file  ? ?Social Determinants of Health  ? ?Financial Resource Strain: Not on file  ?Food Insecurity: Not on file  ?Transportation Needs: Not on file  ?Physical Activity: Not on file  ?Stress: Not on file  ?Social Connections: Not on file  ? ? ? ?Review of Systems  ?Constitutional:  Negative for appetite change and unexpected weight change.  ?HENT:  Negative for congestion and sinus pressure.   ?     Some persistent drainage/clearing of throat.  Ears appear to be better.   ?Respiratory:  Negative for cough, chest tightness and shortness of breath.    ?Cardiovascular:  Negative for chest pain, palpitations and leg swelling.  ?Gastrointestinal:  Negative for abdominal pain, diarrhea, nausea and vomiting.  ?Genitourinary:  Negative for difficulty urinating.  ?     Has been having issues with urgency and frequency.   ?Musculoskeletal:  Negative for joint swelling and myalgias.  ?Skin:  Negative for color change and rash.  ?Neurological:  Negative for dizziness, light-headedness and headaches.  ?Psychiatric/Behavioral:    ?     Increased stress and anxiety as outlined.   ? ?   ?Objective:  ?  ? ?BP 116/78 (BP Location: Right Arm, Patient Position: Sitting, Cuff Size: Small)   Pulse 62   Temp 97.9 ?F (36.6 ?C) (Oral)   Ht '5\' 1"'$  (1.549 m)   Wt 128 lb 3.2 oz (58.2 kg)   LMP 08/29/1995   SpO2 98%   BMI 24.22 kg/m?  ?Wt Readings from Last 3 Encounters:  ?09/14/21 128 lb 3.2 oz (58.2 kg)  ?09/05/21 130 lb 6.4 oz (59.1 kg)  ?08/30/21 120 lb (54.4 kg)  ? ? ?Physical Exam ?Vitals reviewed.  ?Constitutional:   ?   General: She is not in acute distress. ?   Appearance: Normal appearance.  ?HENT:  ?   Head: Normocephalic and atraumatic.  ?   Right Ear: External ear normal.  ?   Left Ear: External ear normal.  ?   Ears:  ?   Comments: No pain with palpation - external ears.  No increased erythema or evidence of infection in ear canal. TMs appear clear.  ?Eyes:  ?   General: No scleral icterus.    ?   Right eye: No discharge.     ?   Left eye: No discharge.  ?   Conjunctiva/sclera: Conjunctivae normal.  ?Neck:  ?   Thyroid: No thyromegaly.  ?Cardiovascular:  ?   Rate and Rhythm: Normal rate and regular rhythm.  ?Pulmonary:  ?   Effort: No respiratory distress.  ?   Breath sounds: Normal breath sounds. No wheezing.  ?Abdominal:  ?   General: Bowel sounds are normal.  ?   Palpations: Abdomen is soft.  ?   Tenderness: There is no abdominal tenderness.  ?Musculoskeletal:     ?   General: No swelling or tenderness.  ?   Cervical back: Neck supple. No tenderness.   ?Lymphadenopathy:  ?   Cervical: No cervical adenopathy.  ?Skin: ?   Findings: No erythema or rash.  ?Neurological:  ?   Mental Status: She is alert.  ?Psychiatric:     ?   Mood and Affect: Mood normal.     ?   Behavior: Behavior normal.  ? ? ? ?Outpatient Encounter Medications as of 09/14/2021  ?Medication  Sig  ? ALPRAZolam (XANAX) 0.25 MG tablet TAKE ONE TABLET EVERY DAY AS NEEDED FOR ANXIETY  ? [EXPIRED] amoxicillin (AMOXIL) 875 MG tablet Take 1 tablet (875 mg total) by mouth 2 (two) times daily for 10 days.  ? Ascorbic Acid (VITAMIN C PO) Take by mouth daily.  ? aspirin 81 MG tablet Take 81 mg by mouth daily.  ? Calcium Carbonate-Vit D-Min (CALCIUM 1200 PO) Take by mouth daily.  ? celecoxib (CELEBREX) 200 MG capsule Take 1 capsule (200 mg total) by mouth daily.  ? COD LIVER OIL PO Take by mouth.  ? docusate sodium (COLACE) 100 MG capsule Take by mouth.  ? fexofenadine (ALLEGRA) 180 MG tablet Take 1 tablet (180 mg total) by mouth daily as needed for allergies or rhinitis.  ? FLUoxetine (PROZAC) 20 MG capsule TAKE 1 CAPSULE BY MOUTH ONCE DAILY  ? hydrochlorothiazide (MICROZIDE) 12.5 MG capsule TAKE 1 CAPSULE BY MOUTH DAILY.  ? mirabegron ER (MYRBETRIQ) 25 MG TB24 tablet Take 1 tablet (25 mg total) by mouth daily.  ? mometasone (ELOCON) 0.1 % lotion Apply 1 application topically as directed.  ? Multiple Vitamin (MULTIVITAMIN) tablet Take 1 tablet by mouth daily.  ? NEOMYCIN-POLYMYXIN-HYDROCORTISONE (CORTISPORIN) 1 % SOLN OTIC solution 4 gtt in both ears tid, max of 10 days. Lie with affected ear upward x 5 minutes  ? Omega-3 Fatty Acids (FISH OIL PO) Take by mouth daily.  ? omeprazole (PRILOSEC) 40 MG capsule TAKE 1 CAPSULE BY MOUTH ONCE DAILY  ? PROAIR HFA 108 (90 Base) MCG/ACT inhaler   ? Probiotic Product (PROBIOTIC PO) Take by mouth.  ? silver sulfADIAZINE (SILVADENE) 1 % cream Apply 1 application topically daily.  ? triamcinolone cream (KENALOG) 0.1 %   ? VITAMIN D PO Take by mouth daily.  ? ?No  facility-administered encounter medications on file as of 09/14/2021.  ?  ? ?Lab Results  ?Component Value Date  ? WBC 5.9 09/14/2021  ? HGB 11.1 (L) 09/14/2021  ? HCT 34.1 (L) 09/14/2021  ? PLT 191.0 09/14/2021  ? GLUCOSE 90 09/14/2021

## 2021-09-15 LAB — BASIC METABOLIC PANEL
BUN: 31 mg/dL — ABNORMAL HIGH (ref 6–23)
CO2: 31 mEq/L (ref 19–32)
Calcium: 9.3 mg/dL (ref 8.4–10.5)
Chloride: 104 mEq/L (ref 96–112)
Creatinine, Ser: 1.1 mg/dL (ref 0.40–1.20)
GFR: 46.95 mL/min — ABNORMAL LOW (ref >60.00)
Glucose, Bld: 90 mg/dL (ref 70–99)
Potassium: 4.1 mEq/L (ref 3.5–5.1)
Sodium: 142 mEq/L (ref 135–145)

## 2021-09-15 LAB — CBC WITH DIFFERENTIAL/PLATELET
Basophils Absolute: 0.1 10*3/uL (ref 0.0–0.1)
Basophils Relative: 1.2 % (ref 0.0–3.0)
Eosinophils Absolute: 0.2 10*3/uL (ref 0.0–0.7)
Eosinophils Relative: 3.5 % (ref 0.0–5.0)
HCT: 34.1 % — ABNORMAL LOW (ref 36.0–46.0)
Hemoglobin: 11.1 g/dL — ABNORMAL LOW (ref 12.0–15.0)
Lymphocytes Relative: 33.3 % (ref 12.0–46.0)
Lymphs Abs: 2 10*3/uL (ref 0.7–4.0)
MCHC: 32.5 g/dL (ref 30.0–36.0)
MCV: 91.9 fl (ref 78.0–100.0)
Monocytes Absolute: 0.4 10*3/uL (ref 0.1–1.0)
Monocytes Relative: 6.7 % (ref 3.0–12.0)
Neutro Abs: 3.3 10*3/uL (ref 1.4–7.7)
Neutrophils Relative %: 55.3 % (ref 43.0–77.0)
Platelets: 191 10*3/uL (ref 150.0–400.0)
RBC: 3.71 Mil/uL — ABNORMAL LOW (ref 3.87–5.11)
RDW: 13.8 % (ref 11.5–15.5)
WBC: 5.9 10*3/uL (ref 4.0–10.5)

## 2021-09-15 LAB — HEPATIC FUNCTION PANEL
ALT: 11 U/L (ref 0–35)
AST: 18 U/L (ref 0–37)
Albumin: 4.3 g/dL (ref 3.5–5.2)
Alkaline Phosphatase: 82 U/L (ref 39–117)
Bilirubin, Direct: 0.1 mg/dL (ref 0.0–0.3)
Total Bilirubin: 0.3 mg/dL (ref 0.2–1.2)
Total Protein: 6.5 g/dL (ref 6.0–8.3)

## 2021-09-15 LAB — URINE CULTURE
MICRO NUMBER:: 13170790
Result:: NO GROWTH
SPECIMEN QUALITY:: ADEQUATE

## 2021-09-15 LAB — VITAMIN B12: Vitamin B-12: 675 pg/mL (ref 211–911)

## 2021-09-17 ENCOUNTER — Encounter: Payer: Self-pay | Admitting: Internal Medicine

## 2021-09-17 NOTE — Assessment & Plan Note (Addendum)
Discussed with Ms Fulop and her daughter [name]).  Discussed the increased stress and aniety could be contributing.  Continues on prozac.  Discussed focus and concentration issues with the increased stress and issues with her husband.  Will check labs, including B12.  Discussed formal neurological evaluation.  Hold on scan a this time.  Check labs.  Consider neurology referral. Treat anxiety.  ?

## 2021-09-17 NOTE — Assessment & Plan Note (Signed)
Increased stress as outlined.  Discussed.  On prozac. Daughter involved and helping to work through some issues - with medication, etc.  Discussed social work - to see what resources are available for help in home.   ?

## 2021-09-17 NOTE — Assessment & Plan Note (Signed)
Breathing stable.

## 2021-09-17 NOTE — Assessment & Plan Note (Signed)
S/p EGD and colonoscopy.  Follow cbc and iron studies.  

## 2021-09-17 NOTE — Assessment & Plan Note (Signed)
Chronic issues with allergies.  I encouraged her to try her Allegra.  If this is not beneficial she could try an antihistamine nasal spray/saline nasal spray/steroid nasal spray.  ?

## 2021-09-17 NOTE — Assessment & Plan Note (Signed)
She has been seen recently for recurring urinary symptoms, including increased frequency, urgency and dysuria.  Has been treated with abx.  Last abx, she did not complete.  Saw Dr Bernardo Heater.  Recommended trial of myrbetriq and renal ultrasound and cystoscopy. Check urine today to confirm no infection.  ?

## 2021-09-17 NOTE — Assessment & Plan Note (Signed)
Previously diagnosed with otitis externa.  No pain.  No evidence of infection today.  Saw ENT.  Cerumen removed.  Follow.  ?

## 2021-09-17 NOTE — Assessment & Plan Note (Signed)
Blood pressure as outlined.  Continues on hctz.  Follow pressures.  Follow metabolic panel.  ?

## 2021-09-17 NOTE — Assessment & Plan Note (Signed)
No upper symptoms reported. On prilosec.  

## 2021-09-17 NOTE — Assessment & Plan Note (Signed)
Have discussed calculated cholesterol risk.  She declines to start cholesterol medication.  Low cholesterol diet and exercise.  Follow lipid panel.   ?

## 2021-09-19 ENCOUNTER — Other Ambulatory Visit: Payer: Self-pay

## 2021-09-19 DIAGNOSIS — R413 Other amnesia: Secondary | ICD-10-CM

## 2021-09-19 DIAGNOSIS — R2689 Other abnormalities of gait and mobility: Secondary | ICD-10-CM

## 2021-09-27 ENCOUNTER — Telehealth: Payer: Self-pay | Admitting: Internal Medicine

## 2021-09-27 DIAGNOSIS — R944 Abnormal results of kidney function studies: Secondary | ICD-10-CM

## 2021-09-27 NOTE — Telephone Encounter (Signed)
Patient has a lab appt 09/28/2021, there are no orders in. ?

## 2021-09-28 ENCOUNTER — Other Ambulatory Visit (INDEPENDENT_AMBULATORY_CARE_PROVIDER_SITE_OTHER): Payer: Medicare Other

## 2021-09-28 DIAGNOSIS — R944 Abnormal results of kidney function studies: Secondary | ICD-10-CM

## 2021-09-28 LAB — BASIC METABOLIC PANEL
BUN: 27 mg/dL — ABNORMAL HIGH (ref 6–23)
CO2: 32 mEq/L (ref 19–32)
Calcium: 9.3 mg/dL (ref 8.4–10.5)
Chloride: 102 mEq/L (ref 96–112)
Creatinine, Ser: 1.04 mg/dL (ref 0.40–1.20)
GFR: 50.2 mL/min — ABNORMAL LOW (ref >60.00)
Glucose, Bld: 100 mg/dL — ABNORMAL HIGH (ref 70–99)
Potassium: 3.8 mEq/L (ref 3.5–5.1)
Sodium: 141 mEq/L (ref 135–145)

## 2021-09-28 NOTE — Addendum Note (Signed)
Addended by: Alisa Graff on: 09/28/2021 07:09 AM ? ? Modules accepted: Orders ? ?

## 2021-09-28 NOTE — Telephone Encounter (Signed)
Lab ordered.

## 2021-09-29 ENCOUNTER — Ambulatory Visit
Admission: RE | Admit: 2021-09-29 | Discharge: 2021-09-29 | Disposition: A | Discharge status: Home / Self Care | Payer: Medicare Other | Source: Ambulatory Visit | Attending: Urology | Admitting: Urology

## 2021-09-29 DIAGNOSIS — R300 Dysuria: Secondary | ICD-10-CM | POA: Insufficient documentation

## 2021-09-29 DIAGNOSIS — R3129 Other microscopic hematuria: Secondary | ICD-10-CM | POA: Insufficient documentation

## 2021-09-29 DIAGNOSIS — R8281 Pyuria: Secondary | ICD-10-CM | POA: Diagnosis not present

## 2021-09-29 DIAGNOSIS — R399 Unspecified symptoms and signs involving the genitourinary system: Secondary | ICD-10-CM | POA: Diagnosis not present

## 2021-09-29 DIAGNOSIS — R319 Hematuria, unspecified: Secondary | ICD-10-CM | POA: Diagnosis not present

## 2021-10-05 ENCOUNTER — Ambulatory Visit: Payer: Medicare Other | Admitting: Internal Medicine

## 2021-10-05 ENCOUNTER — Ambulatory Visit: Payer: Medicare Other | Admitting: Urology

## 2021-10-05 ENCOUNTER — Encounter: Payer: Self-pay | Admitting: Urology

## 2021-10-05 VITALS — BP 159/80 | HR 84 | Ht 61.0 in | Wt 128.0 lb

## 2021-10-05 DIAGNOSIS — R8281 Pyuria: Secondary | ICD-10-CM

## 2021-10-05 DIAGNOSIS — R3129 Other microscopic hematuria: Secondary | ICD-10-CM

## 2021-10-05 NOTE — Progress Notes (Signed)
? ?  10/05/21 ? ?CC:  ?Chief Complaint  ?Patient presents with  ? Cysto  ? ? ?HPI: Refer to my previous note 08/30/2021.  Voiding symptoms were improved on Myrbetriq and improvement continues after completing the samples.  RUS showed no upper tract abnormalities. ? ?Last menstrual period 08/29/1995. ?NED. A&Ox3.   ?No respiratory distress   ?Abd soft, NT, ND ?Normal external genitalia with patent urethral meatus ? ?Cystoscopy Procedure Note ? ?Patient identification was confirmed, informed consent was obtained, and patient was prepped using Betadine solution.  Lidocaine jelly was administered per urethral meatus.   ? ?Procedure: ?- Flexible cystoscope introduced, without any difficulty.   ?- Thorough search of the bladder revealed: ?   normal urethral meatus ?   normal urothelium ?   no stones ?   no ulcers  ?   no tumors ?   no urethral polyps ?   no trabeculation ? ?- Ureteral orifices were normal in position and appearance. ? ?Post-Procedure: ?- Patient tolerated the procedure well ? ?Assessment/ Plan: ?No bladder mucosal abnormalities identified ?Follow-up 6 months for symptom check and UA ? ? ? ?Abbie Sons, MD ? ?

## 2021-10-06 ENCOUNTER — Encounter: Payer: Self-pay | Admitting: Urology

## 2021-10-17 IMAGING — DX DG TOE GREAT 2+V*R*
3 series · 3 of 3 positions shown · non-contrast
Comparison: None.

CLINICAL DATA: Right great toe injury, pain, swelling

EXAM:
RIGHT GREAT TOE

[toes ap]
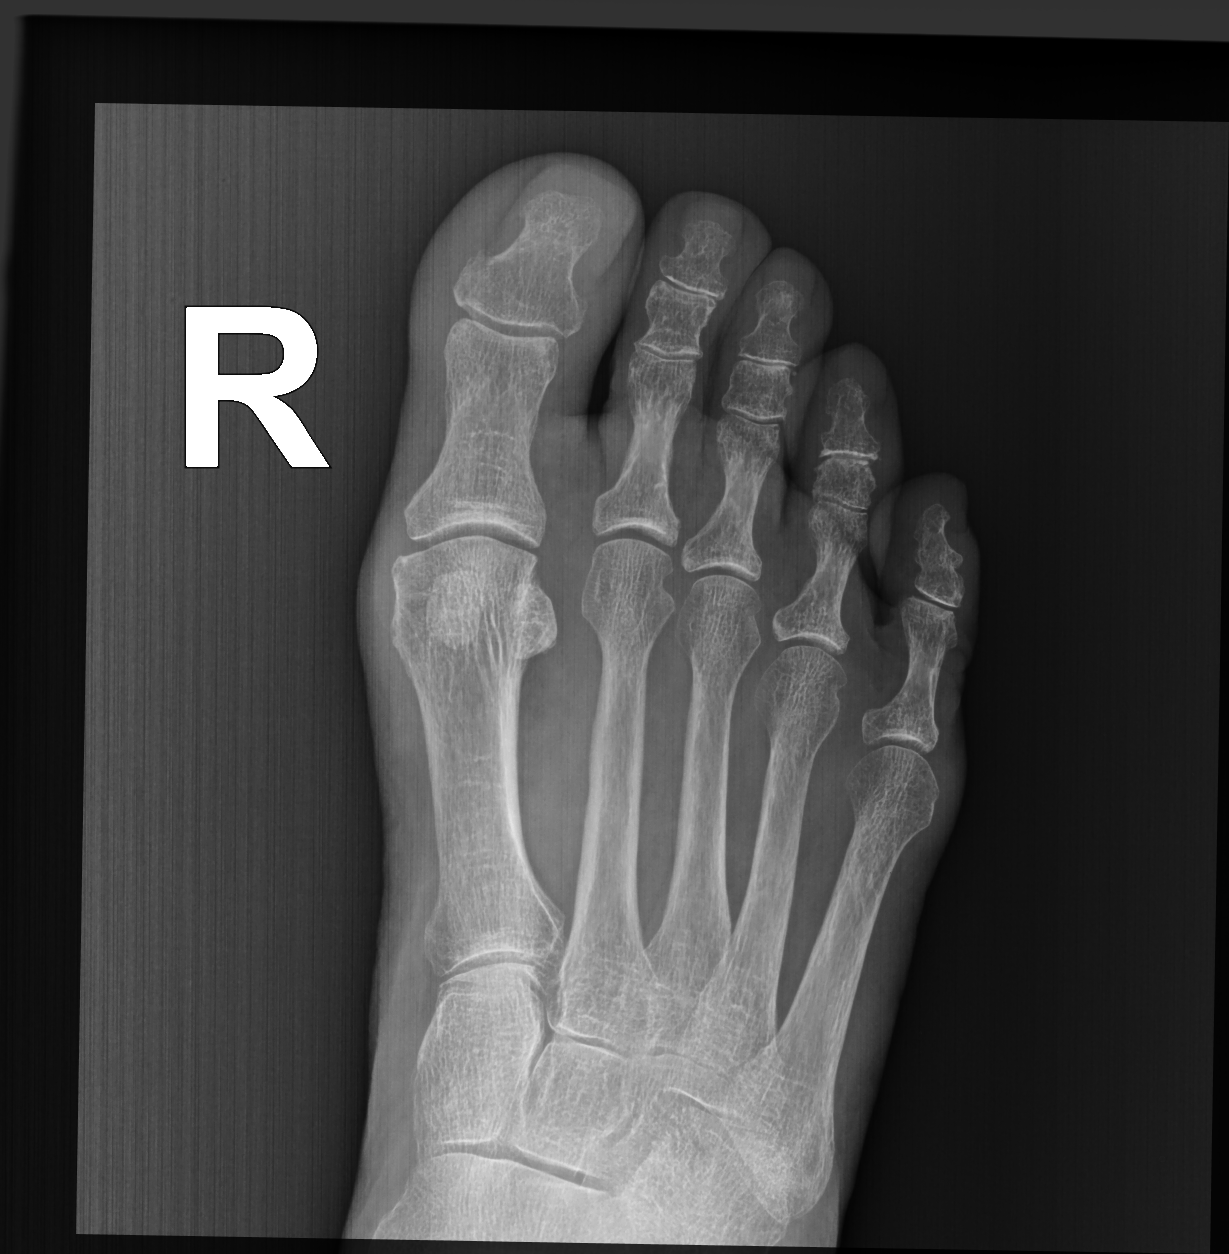

[toes obl (oblique)]
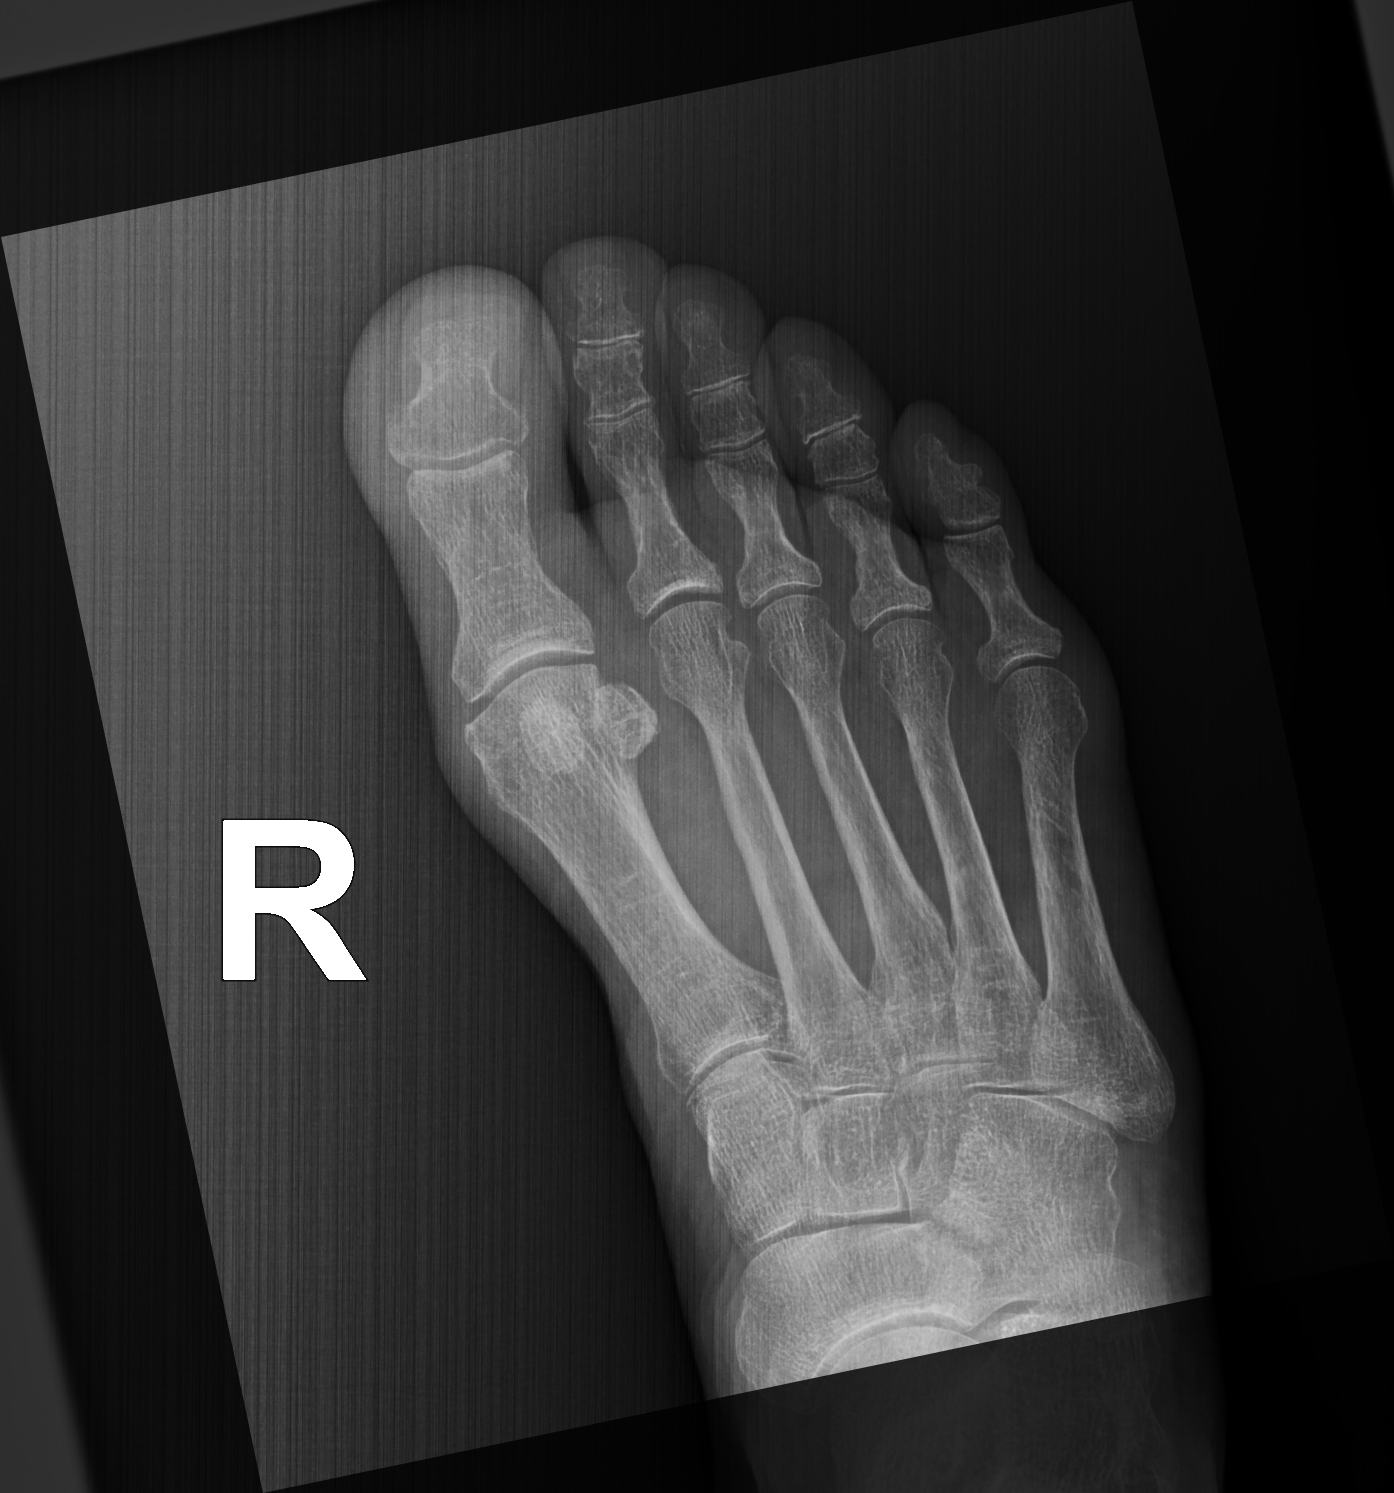

[toes lat]
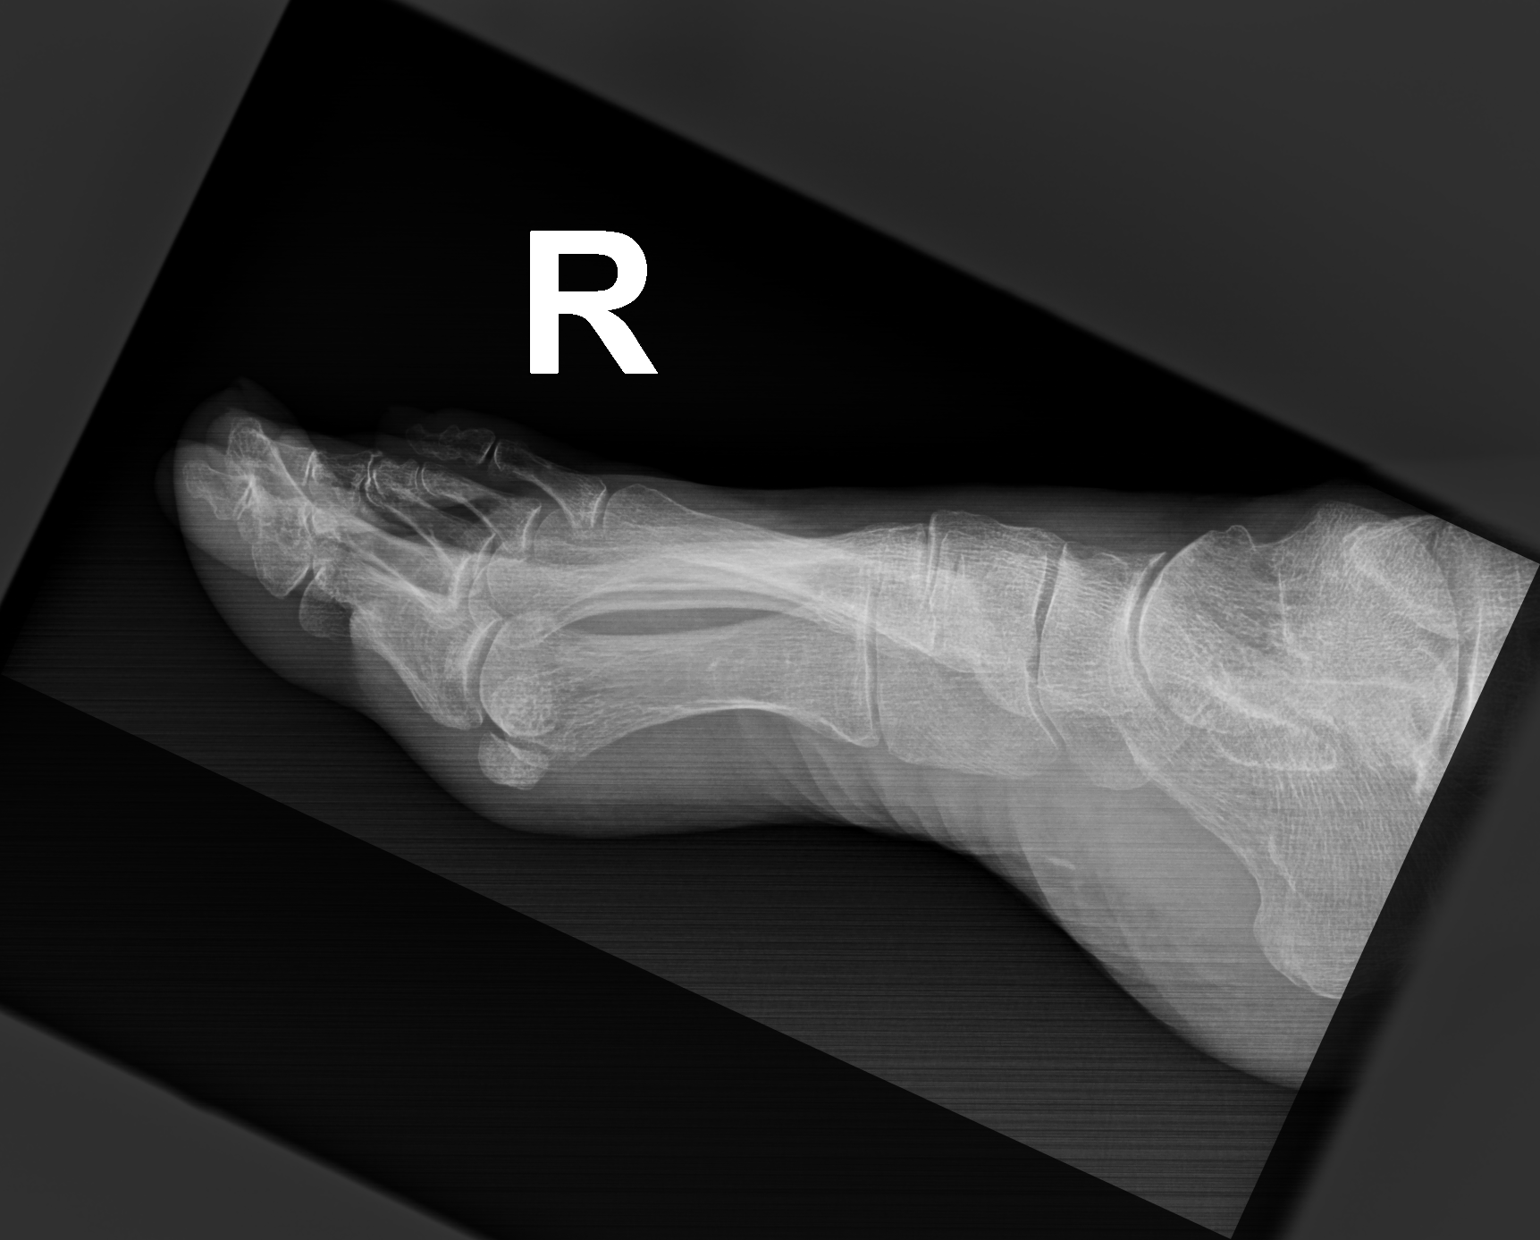

[3 of 3 positions shown; findings below may reference images not displayed]

FINDINGS: Three view radiograph right great toe demonstrates normal alignment.
No fracture or dislocation. Joint spaces are preserved. No erosive
changes identified. Mild diffuse soft tissue swelling.
IMPRESSION: Soft tissue swelling.  No fracture or dislocation.

## 2021-10-18 ENCOUNTER — Ambulatory Visit (INDEPENDENT_AMBULATORY_CARE_PROVIDER_SITE_OTHER): Payer: Medicare Other | Admitting: Internal Medicine

## 2021-10-18 ENCOUNTER — Encounter: Payer: Self-pay | Admitting: Internal Medicine

## 2021-10-18 DIAGNOSIS — E7800 Pure hypercholesterolemia, unspecified: Secondary | ICD-10-CM

## 2021-10-18 DIAGNOSIS — F419 Anxiety disorder, unspecified: Secondary | ICD-10-CM

## 2021-10-18 DIAGNOSIS — R531 Weakness: Secondary | ICD-10-CM

## 2021-10-18 DIAGNOSIS — M79673 Pain in unspecified foot: Secondary | ICD-10-CM

## 2021-10-18 DIAGNOSIS — D649 Anemia, unspecified: Secondary | ICD-10-CM

## 2021-10-18 DIAGNOSIS — D509 Iron deficiency anemia, unspecified: Secondary | ICD-10-CM

## 2021-10-18 DIAGNOSIS — I10 Essential (primary) hypertension: Secondary | ICD-10-CM | POA: Diagnosis not present

## 2021-10-18 DIAGNOSIS — Z9109 Other allergy status, other than to drugs and biological substances: Secondary | ICD-10-CM

## 2021-10-18 DIAGNOSIS — R413 Other amnesia: Secondary | ICD-10-CM

## 2021-10-18 DIAGNOSIS — J4520 Mild intermittent asthma, uncomplicated: Secondary | ICD-10-CM

## 2021-10-18 DIAGNOSIS — K219 Gastro-esophageal reflux disease without esophagitis: Secondary | ICD-10-CM

## 2021-10-18 NOTE — Progress Notes (Signed)
Patient ID: Patricia Pacheco, female   DOB: 29-Feb-1941, 81 y.o.   MRN: [id number] ? ? ?Subjective:  ? ? Patient ID: Patricia Pacheco, female    DOB: Sep 03, 1940, 81 y.o.   MRN: [id number] ? ?This visit occurred during the SARS-CoV-2 public health emergency.  Safety protocols were in place, including screening questions prior to the visit, additional usage of staff PPE, and extensive cleaning of exam room while observing appropriate contact time as indicated for disinfecting solutions.  ? ?Patient here for a scheduled follow up.  ? ?Chief Complaint  ?Patient presents with  ? Follow-up  ?  Follow up   ? Hypertension  ? .  ? ?HPI ?She is accompanied by her daughter.  History obtained from both of them.  Has been having problems with plantar fasciitis. Persistent increased foot pain.  Request referral to podiatry.  No chest pain.  Breathing stable.  No cough or congestion.  No abdominal pain.  Eating.  Bowels stable.  Foot pain effects hips and back.  Some weakness - legs.  Discussed home health PT.  ? ? ?Past Medical History:  ?Diagnosis Date  ? Allergy   ? allergy shots (Juengel)  ? Anxiety   ? Arthritis   ? Asthma   ? CAD (coronary artery disease)   ? cath 11/27/04 - moderated LAD disease  ? Cancer [facility])   ? skin  ? GERD (gastroesophageal reflux disease)   ? EGD (05/25/04) - slight presbyesophagus, mild esophagitis, mild gastritis, mild duodenitis - positive H. pylori.   ? Neuropathy   ? ?Past Surgical History:  ?Procedure Laterality Date  ? ABDOMINAL HYSTERECTOMY    ? APPENDECTOMY  1967  ? COLONOSCOPY WITH PROPOFOL N/A 11/16/2019  ? Procedure: COLONOSCOPY WITH PROPOFOL;  Surgeon: Lin Landsman, MD;  Location: [facility] ENDOSCOPY;  Service: Gastroenterology;  Laterality: N/A;  ? ESOPHAGOGASTRODUODENOSCOPY (EGD) WITH PROPOFOL N/A 11/16/2019  ? Procedure: ESOPHAGOGASTRODUODENOSCOPY (EGD) WITH PROPOFOL;  Surgeon: Lin Landsman, MD;  Location: [facility] ENDOSCOPY;  Service: Gastroenterology;  Laterality: N/A;  ? ?Family History   ?Problem Relation Age of Onset  ? Arthritis Mother   ? Stroke Sister   ? Breast cancer Neg Hx   ? ?Social History  ? ?Socioeconomic History  ? Marital status: Married  ?  Spouse name: Not on file  ? Number of children: Not on file  ? Years of education: Not on file  ? Highest education level: Not on file  ?Occupational History  ? Not on file  ?Tobacco Use  ? Smoking status: Never  ? Smokeless tobacco: Never  ?Vaping Use  ? Vaping Use: Never used  ?Substance and Sexual Activity  ? Alcohol use: No  ?  Alcohol/week: 0.0 standard drinks  ? Drug use: No  ? Sexual activity: Not on file  ?Other Topics Concern  ? Not on file  ?Social History Narrative  ? Not on file  ? ?Social Determinants of Health  ? ?Financial Resource Strain: Low Risk   ? Difficulty of Paying Living Expenses: Not hard at all  ?Food Insecurity: No Food Insecurity  ? Worried About Charity fundraiser in the Last Year: Never true  ? Ran Out of Food in the Last Year: Never true  ?Transportation Needs: No Transportation Needs  ? Lack of Transportation (Medical): No  ? Lack of Transportation (Non-Medical): No  ?Physical Activity: Not on file  ?Stress: No Stress Concern Present  ? Feeling of Stress : Not at all  ?Social Connections: Socially  Integrated  ? Frequency of Communication with Friends and Family: More than three times a week  ? Frequency of Social Gatherings with Friends and Family: Twice a week  ? Attends Religious Services: More than 4 times per year  ? Active Member of Clubs or Organizations: Yes  ? Attends Archivist Meetings: Not on file  ? Marital Status: Married  ? ? ? ?Review of Systems  ?Constitutional:  Negative for appetite change and unexpected weight change.  ?HENT:  Negative for congestion and sinus pressure.   ?Respiratory:  Negative for cough, chest tightness and shortness of breath.   ?Cardiovascular:  Negative for chest pain, palpitations and leg swelling.  ?Gastrointestinal:  Negative for abdominal pain, diarrhea, nausea  and vomiting.  ?Genitourinary:  Negative for difficulty urinating and dysuria.  ?Musculoskeletal:  Negative for joint swelling and myalgias.  ?     Foot pain as outlined.    ?Skin:  Negative for color change and rash.  ?Neurological:  Negative for dizziness, light-headedness and headaches.  ?Psychiatric/Behavioral:  Negative for agitation and dysphoric mood.   ? ?   ?Objective:  ?  ? ?BP 114/72 (BP Location: Left Arm, Patient Position: Sitting, Cuff Size: Small)   Pulse 73   Temp 98.4 ?F (36.9 ?C) (Temporal)   Resp 12   Ht '5\' 1"'$  (1.549 m)   Wt 128 lb 3.2 oz (58.2 kg)   LMP 08/29/1995   SpO2 94%   BMI 24.22 kg/m?  ?Wt Readings from Last 3 Encounters:  ?10/24/21 128 lb (58.1 kg)  ?10/18/21 128 lb 3.2 oz (58.2 kg)  ?10/05/21 128 lb (58.1 kg)  ? ? ?Physical Exam ?Vitals reviewed.  ?Constitutional:   ?   General: She is not in acute distress. ?   Appearance: Normal appearance.  ?HENT:  ?   Head: Normocephalic and atraumatic.  ?   Right Ear: External ear normal.  ?   Left Ear: External ear normal.  ?Eyes:  ?   General: No scleral icterus.    ?   Right eye: No discharge.     ?   Left eye: No discharge.  ?   Conjunctiva/sclera: Conjunctivae normal.  ?Neck:  ?   Thyroid: No thyromegaly.  ?Cardiovascular:  ?   Rate and Rhythm: Normal rate and regular rhythm.  ?Pulmonary:  ?   Effort: No respiratory distress.  ?   Breath sounds: Normal breath sounds. No wheezing.  ?Abdominal:  ?   General: Bowel sounds are normal.  ?   Palpations: Abdomen is soft.  ?   Tenderness: There is no abdominal tenderness.  ?Musculoskeletal:     ?   General: No swelling or tenderness.  ?   Cervical back: Neck supple. No tenderness.  ?Lymphadenopathy:  ?   Cervical: No cervical adenopathy.  ?Skin: ?   Findings: No erythema or rash.  ?Neurological:  ?   Mental Status: She is alert.  ?Psychiatric:     ?   Mood and Affect: Mood normal.     ?   Behavior: Behavior normal.  ? ? ? ?Outpatient Encounter Medications as of 10/18/2021  ?Medication Sig  ?  ALPRAZolam (XANAX) 0.25 MG tablet TAKE ONE TABLET EVERY DAY AS NEEDED FOR ANXIETY  ? Ascorbic Acid (VITAMIN C PO) Take by mouth daily.  ? aspirin 81 MG tablet Take 81 mg by mouth daily.  ? Calcium Carbonate-Vit D-Min (CALCIUM 1200 PO) Take by mouth daily.  ? celecoxib (CELEBREX) 200 MG capsule Take 1 capsule (  200 mg total) by mouth daily.  ? COD LIVER OIL PO Take by mouth.  ? docusate sodium (COLACE) 100 MG capsule Take by mouth.  ? fexofenadine (ALLEGRA) 180 MG tablet Take 1 tablet (180 mg total) by mouth daily as needed for allergies or rhinitis.  ? hydrochlorothiazide (MICROZIDE) 12.5 MG capsule TAKE 1 CAPSULE BY MOUTH DAILY.  ? mirabegron ER (MYRBETRIQ) 25 MG TB24 tablet Take 1 tablet (25 mg total) by mouth daily.  ? mometasone (ELOCON) 0.1 % lotion Apply 1 application topically as directed.  ? Multiple Vitamin (MULTIVITAMIN) tablet Take 1 tablet by mouth daily.  ? NEOMYCIN-POLYMYXIN-HYDROCORTISONE (CORTISPORIN) 1 % SOLN OTIC solution 4 gtt in both ears tid, max of 10 days. Lie with affected ear upward x 5 minutes  ? Omega-3 Fatty Acids (FISH OIL PO) Take by mouth daily.  ? PROAIR HFA 108 (90 Base) MCG/ACT inhaler   ? Probiotic Product (PROBIOTIC PO) Take by mouth.  ? silver sulfADIAZINE (SILVADENE) 1 % cream Apply 1 application topically daily.  ? triamcinolone cream (KENALOG) 0.1 %   ? VITAMIN D PO Take by mouth daily.  ? [DISCONTINUED] FLUoxetine (PROZAC) 20 MG capsule TAKE 1 CAPSULE BY MOUTH ONCE DAILY  ? [DISCONTINUED] omeprazole (PRILOSEC) 40 MG capsule TAKE 1 CAPSULE BY MOUTH ONCE DAILY  ? ?No facility-administered encounter medications on file as of 10/18/2021.  ?  ? ?Lab Results  ?Component Value Date  ? WBC 5.9 09/14/2021  ? HGB 11.1 (L) 09/14/2021  ? HCT 34.1 (L) 09/14/2021  ? PLT 191.0 09/14/2021  ? GLUCOSE 100 (H) 09/28/2021  ? CHOL 203 (H) 08/04/2020  ? TRIG 108.0 08/04/2020  ? HDL 62.20 08/04/2020  ? LDLDIRECT 115.0 09/04/2012  ? LDLCALC 119 (H) 08/04/2020  ? ALT 11 09/14/2021  ? AST 18 09/14/2021   ? NA 141 09/28/2021  ? K 3.8 09/28/2021  ? CL 102 09/28/2021  ? CREATININE 1.04 09/28/2021  ? BUN 27 (H) 09/28/2021  ? CO2 32 09/28/2021  ? TSH 1.74 08/24/2021  ? HGBA1C 6.1 10/04/2017  ? ? ?Ultrasound renal complete

## 2021-10-24 ENCOUNTER — Ambulatory Visit (INDEPENDENT_AMBULATORY_CARE_PROVIDER_SITE_OTHER): Payer: Medicare Other

## 2021-10-24 VITALS — Ht 61.0 in | Wt 128.0 lb

## 2021-10-24 DIAGNOSIS — Z0000 Encounter for general adult medical examination without abnormal findings: Secondary | ICD-10-CM

## 2021-10-24 NOTE — Patient Instructions (Addendum)
?  Ms. Tussey , ?Thank you for taking time to come for your Medicare Wellness Visit. I appreciate your ongoing commitment to your health goals. Please review the following plan we discussed and let me know if I can assist you in the future.  ? ?These are the goals we discussed: ? Goals   ? ?  DIET - INCREASE LEAN PROTEINS   ?  Low cholesterol diet ? ?  ?  DIET - INCREASE WATER INTAKE   ?  Stay hydrated ?  ? ?  ?  ?This is a list of the screening recommended for you and due dates:  ?Health Maintenance  ?Topic Date Due  ? Zoster (Shingles) Vaccine (1 of 2) 12/15/2021*  ? COVID-19 Vaccine (4 - Booster for Moderna series) 08/19/2022*  ? Tetanus Vaccine  09/15/2022*  ? Mammogram  10/26/2021  ? Flu Shot  01/23/2022  ? Colon Cancer Screening  11/16/2022  ? Pneumonia Vaccine  Completed  ? DEXA scan (bone density measurement)  Completed  ? HPV Vaccine  Aged Out  ?*Topic was postponed. The date shown is not the original due date.  ?  ?

## 2021-10-24 NOTE — Progress Notes (Signed)
Subjective:   Patricia Pacheco is a 81 y.o. female who presents for Medicare Annual (Subsequent) preventive examination.  Review of Systems    No ROS.  Medicare Wellness Virtual Visit.  Visual/audio telehealth visit, UTA vital signs.   See social history for additional risk factors.   Cardiac Risk Factors include: advanced age (>89men, >43 women)     Objective:    Today's Vitals   10/24/21 1622  Weight: 128 lb (58.1 kg)  Height: 5\' 1"  (1.549 m)   Body mass index is 24.19 kg/m.     10/24/2021    3:53 PM 01/12/2020   12:37 PM 11/16/2019    9:09 AM 12/16/2017    5:14 PM  Advanced Directives  Does Patient Have a Medical Advance Directive? Yes Yes Yes Yes  Type of Estate agent of [place];Living will Healthcare Power of [place];Living will  Healthcare Power of [place];Living will  Does patient want to make changes to medical advance directive? No - Patient declined No - Patient declined  No - Patient declined  Copy of Healthcare Power of Attorney in Chart? No - copy requested No - copy requested  No - copy requested    Current Medications (verified) Outpatient Encounter Medications as of 10/24/2021  Medication Sig   ALPRAZolam (XANAX) 0.25 MG tablet TAKE ONE TABLET EVERY DAY AS NEEDED FOR ANXIETY   Ascorbic Acid (VITAMIN C PO) Take by mouth daily.   aspirin 81 MG tablet Take 81 mg by mouth daily.   Calcium Carbonate-Vit D-Min (CALCIUM 1200 PO) Take by mouth daily.   celecoxib (CELEBREX) 200 MG capsule Take 1 capsule (200 mg total) by mouth daily.   COD LIVER OIL PO Take by mouth.   docusate sodium (COLACE) 100 MG capsule Take by mouth.   fexofenadine (ALLEGRA) 180 MG tablet Take 1 tablet (180 mg total) by mouth daily as needed for allergies or rhinitis.   FLUoxetine (PROZAC) 20 MG capsule TAKE 1 CAPSULE BY MOUTH ONCE DAILY   hydrochlorothiazide (MICROZIDE) 12.5 MG capsule TAKE 1 CAPSULE BY MOUTH DAILY.   mirabegron ER (MYRBETRIQ) 25 MG TB24 tablet Take 1  tablet (25 mg total) by mouth daily.   mometasone (ELOCON) 0.1 % lotion Apply 1 application topically as directed.   Multiple Vitamin (MULTIVITAMIN) tablet Take 1 tablet by mouth daily.   NEOMYCIN-POLYMYXIN-HYDROCORTISONE (CORTISPORIN) 1 % SOLN OTIC solution 4 gtt in both ears tid, max of 10 days. Lie with affected ear upward x 5 minutes   Omega-3 Fatty Acids (FISH OIL PO) Take by mouth daily.   omeprazole (PRILOSEC) 40 MG capsule TAKE 1 CAPSULE BY MOUTH ONCE DAILY   PROAIR HFA 108 (90 Base) MCG/ACT inhaler    Probiotic Product (PROBIOTIC PO) Take by mouth.   silver sulfADIAZINE (SILVADENE) 1 % cream Apply 1 application topically daily.   triamcinolone cream (KENALOG) 0.1 %    VITAMIN D PO Take by mouth daily.   No facility-administered encounter medications on file as of 10/24/2021.    Allergies (verified) Mushroom extract complex   History: Past Medical History:  Diagnosis Date   Allergy    allergy shots (Juengel)   Anxiety    Arthritis    Asthma    CAD (coronary artery disease)    cath 11/27/04 - moderated LAD disease   Cancer (HCC)    skin   GERD (gastroesophageal reflux disease)    EGD (05/25/04) - slight presbyesophagus, mild esophagitis, mild gastritis, mild duodenitis - positive H. pylori.  Neuropathy    Past Surgical History:  Procedure Laterality Date   ABDOMINAL HYSTERECTOMY     APPENDECTOMY  1967   COLONOSCOPY WITH PROPOFOL N/A 11/16/2019   Procedure: COLONOSCOPY WITH PROPOFOL;  Surgeon: Toney Reil, MD;  Location[facility] ENDOSCOPY;  Service: Gastroenterology;  Laterality: N/A;   ESOPHAGOGASTRODUODENOSCOPY (EGD) WITH PROPOFOL N/A 11/16/2019   Procedure: ESOPHAGOGASTRODUODENOSCOPY (EGD) WITH PROPOFOL;  Surgeon: Toney Reil, MD;  Location: [facility] ENDOSCOPY;  Service: Gastroenterology;  Laterality: N/A;   Family History  Problem Relation Age of Onset   Arthritis Mother    Stroke Sister    Breast cancer Neg Hx    Social History   Socioeconomic History    Marital status: Married    Spouse name: Not on file   Number of children: Not on file   Years of education: Not on file   Highest education level: Not on file  Occupational History   Not on file  Tobacco Use   Smoking status: Never   Smokeless tobacco: Never  Vaping Use   Vaping Use: Never used  Substance and Sexual Activity   Alcohol use: No    Alcohol/week: 0.0 standard drinks   Drug use: No   Sexual activity: Not on file  Other Topics Concern   Not on file  Social History Narrative   Not on file   Social Determinants of Health   Financial Resource Strain: Low Risk    Difficulty of Paying Living Expenses: Not hard at all  Food Insecurity: No Food Insecurity   Worried About Programme researcher, broadcasting/film/video in the Last Year: Never true   Ran Out of Food in the Last Year: Never true  Transportation Needs: No Transportation Needs   Lack of Transportation (Medical): No   Lack of Transportation (Non-Medical): No  Physical Activity: Not on file  Stress: No Stress Concern Present   Feeling of Stress : Not at all  Social Connections: Socially Integrated   Frequency of Communication with Friends and Family: More than three times a week   Frequency of Social Gatherings with Friends and Family: Twice a week   Attends Religious Services: More than 4 times per year   Active Member of [employer] or Organizations: Yes   Attends Engineer, structural: Not on file   Marital Status: Married    Tobacco Counseling Counseling given: Not Answered   Clinical Intake:  Pre-visit preparation completed: Yes        Diabetes: No  How often do you need to have someone help you when you read instructions, pamphlets, or other written materials from your doctor or pharmacy?: 1 - Never     Activities of Daily Living    10/24/2021    3:56 PM  In your present state of health, do you have any difficulty performing the following activities:  Hearing? 1  Comment Hearing aids  Vision? 0   Difficulty concentrating or making decisions? 0  Walking or climbing stairs? 1  Comment Paces self  Dressing or bathing? 0  Doing errands, shopping? 1  Comment Family Ship broker and eating ? N  Using the Toilet? N  In the past six months, have you accidently leaked urine? N  Do you have problems with loss of bowel control? N  Managing your Medications? N  Managing your Finances? N  Comment Family assist as needed  Housekeeping or managing your Housekeeping? N    Patient Care Team: Dale Reno, MD as PCP - General (Internal  Medicine) Dale Chamizal, MD (Internal Medicine) Lafe Garin, MD as Referring Physician (Obstetrics and Gynecology)  Indicate any recent Medical Services you may have received from other than [facility] providers in the past year (date may be approximate).     Assessment:   This is a routine wellness examination for Patricia Pacheco.  Virtual Visit via Telephone Note  I connected with  Patricia Pacheco on 10/24/21 at  3:45 PM EDT by telephone and verified that I am speaking with the correct person using two identifiers.  Persons participating in the virtual visit: patient/Nurse Health Advisor   I discussed the limitations of performing an evaluation and management service by telehealth. The patient expressed understanding and agreed to proceed.We continued and completed visit with audio only.Some vital signs may be absent or patient reported.   Hearing/Vision screen Hearing Screening - Comments:: Patient has difficulty. Hearing aids. Followed by Dr. Andee Poles.  Vision Screening - Comments:: Followed by [facility] Wears corrective lenses  Annual visits  Lasik complete  Cataract extraction, bilateral  They have regular follow up with the ophthalmologist.   Dietary issues and exercise activities discussed: Current Exercise Habits: Home exercise routine, Type of exercise: walking, Intensity: Mild Regular diet Good water   Goals Addressed              This Visit's Progress    DIET - INCREASE WATER INTAKE       Stay hydrated       Depression Screen    10/24/2021    4:27 PM 10/18/2021    1:15 PM 09/14/2021    3:45 PM 09/05/2021   11:08 AM 08/24/2021   11:42 AM 01/12/2020   12:39 PM 12/19/2018   10:27 AM  PHQ 2/9 Scores  PHQ - 2 Score 0 0 0 2 0 0 0  PHQ- 9 Score 0   3       Fall Risk    10/24/2021    3:56 PM 10/18/2021    1:15 PM 09/14/2021    3:45 PM 09/05/2021   11:08 AM 08/24/2021   11:42 AM  Fall Risk   Falls in the past year? 0 0 0 0 1  Number falls in past yr: 0    0  Injury with Fall?     0  Risk for fall due to :   No Fall Risks No Fall Risks History of fall(s)  Follow up Falls evaluation completed Falls evaluation completed Falls evaluation completed Falls evaluation completed Falls evaluation completed   FALL RISK PREVENTION PERTAINING TO THE HOME: Home free of loose throw rugs in walkways, pet beds, electrical cords, etc? Yes  Adequate lighting in your home to reduce risk of falls? Yes   ASSISTIVE DEVICES UTILIZED TO PREVENT FALLS: Life alert? No  Use of a cane, walker or w/c? Yes  Grab bars in the bathroom? Yes  Shower chair or bench in shower? Yes  Elevated toilet seat or a handicapped toilet? Yes   TIMED UP AND GO: Was the test performed? No .   Cognitive Function:  Patient is alert and oriented x3.       10/24/2021    4:29 PM 01/12/2020   12:46 PM 12/16/2017    5:19 PM  6CIT Screen  What Year? 0 points 0 points 0 points  What month? 0 points 0 points 0 points  What time? 0 points 0 points 0 points  Count back from 20   0 points  Months in reverse 0 points  0 points  Repeat phrase   0 points  Total Score   0 points    Immunizations Immunization History  Administered Date(s) Administered   Influenza, High Dose Seasonal PF 03/28/2016, 04/12/2017, 03/07/2018, 04/13/2020   Influenza,inj,Quad PF,6+ Mos 06/23/2013, 05/03/2014, 05/11/2015   Influenza-Unspecified 04/12/2017   Moderna  Sars-Covid-2 Vaccination 07/16/2019, 08/18/2019, 05/11/2020   Pneumococcal Conjugate-13 05/11/2015   Pneumococcal Polysaccharide-23 07/01/2017   Zoster, Live 05/31/2008   Screening Tests Health Maintenance  Topic Date Due   Zoster Vaccines- Shingrix (1 of 2) 12/15/2021 (Originally 08/27/1989)   COVID-19 Vaccine (4 - Booster for Moderna series) 08/19/2022 (Originally 07/06/2020)   TETANUS/TDAP  09/15/2022 (Originally 08/28/1959)   MAMMOGRAM  10/26/2021   INFLUENZA VACCINE  01/23/2022   COLONOSCOPY (Pts 45-64yrs Insurance coverage will need to be confirmed)  11/16/2022   Pneumonia Vaccine 4+ Years old  Completed   DEXA SCAN  Completed   HPV VACCINES  Aged Out   Health Maintenance There are no preventive care reminders to display for this patient.  Lung Cancer Screening: (Low Dose CT Chest recommended if Age 28-80 years, 30 pack-year currently smoking OR have quit w/in 15years.) does not qualify.   Vision Screening: Recommended annual ophthalmology exams for early detection of glaucoma and other disorders of the eye.  Dental Screening: Recommended annual dental exams for proper oral hygiene  Community Resource Referral / Chronic Care Management: CRR required this visit?  No   CCM required this visit?  No      Plan:   Keep all routine maintenance appointments.   I have personally reviewed and noted the following in the patient's chart:   Medical and social history Use of alcohol, tobacco or illicit drugs  Current medications and supplements including opioid prescriptions.  Functional ability and status Nutritional status Physical activity Advanced directives List of other physicians Hospitalizations, surgeries, and ER visits in previous 12 months Vitals Screenings to include cognitive, depression, and falls Referrals and appointments  In addition, I have reviewed and discussed with patient certain preventive protocols, quality metrics, and best practice recommendations. A  written personalized care plan for preventive services as well as general preventive health recommendations were provided to patient.     Ashok Pall, LPN   06/30/1094

## 2021-10-27 ENCOUNTER — Other Ambulatory Visit: Payer: Self-pay | Admitting: Internal Medicine

## 2021-10-29 DIAGNOSIS — R531 Weakness: Secondary | ICD-10-CM | POA: Insufficient documentation

## 2021-10-29 DIAGNOSIS — D649 Anemia, unspecified: Secondary | ICD-10-CM | POA: Insufficient documentation

## 2021-10-29 DIAGNOSIS — M79673 Pain in unspecified foot: Secondary | ICD-10-CM | POA: Insufficient documentation

## 2021-10-29 NOTE — Assessment & Plan Note (Signed)
S/p EGD and colonoscopy.  Follow cbc and iron studies.  

## 2021-10-29 NOTE — Assessment & Plan Note (Signed)
Have discussed calculated cholesterol risk.  She declines to start cholesterol medication.  Low cholesterol diet and exercise.  Follow lipid panel.   ?

## 2021-10-29 NOTE — Assessment & Plan Note (Signed)
On allegra.  Stable.  ?

## 2021-10-29 NOTE — Assessment & Plan Note (Signed)
Foot pain as outlined. Persistent.  Discussed supports.  Refer to podiatry for further evaluation.  ?

## 2021-10-29 NOTE — Assessment & Plan Note (Signed)
On prozac.   ?

## 2021-10-29 NOTE — Assessment & Plan Note (Signed)
Discussed with Patricia Pacheco and her daughter [name]).  Discussed the increased stress and aniety could be contributing.  Continues on prozac.  Discussed focus and concentration issues with the increased stress and issues with her husband.  Have discussed formal neurological evaluation.    ?

## 2021-10-29 NOTE — Assessment & Plan Note (Signed)
Blood pressure as outlined.  Continues on hctz.  Follow pressures.  Follow metabolic panel.  ?

## 2021-10-29 NOTE — Assessment & Plan Note (Signed)
Breathing stable.

## 2021-10-29 NOTE — Assessment & Plan Note (Signed)
Weakness - leg/core muscles.  Some unsteadiness.  Will have home health PT evaluate and treat.   ?

## 2021-10-29 NOTE — Assessment & Plan Note (Signed)
No upper symptoms reported. On prilosec.  

## 2021-11-01 DIAGNOSIS — Z9049 Acquired absence of other specified parts of digestive tract: Secondary | ICD-10-CM | POA: Diagnosis not present

## 2021-11-01 DIAGNOSIS — Z85828 Personal history of other malignant neoplasm of skin: Secondary | ICD-10-CM | POA: Diagnosis not present

## 2021-11-01 DIAGNOSIS — F419 Anxiety disorder, unspecified: Secondary | ICD-10-CM | POA: Diagnosis not present

## 2021-11-01 DIAGNOSIS — E7800 Pure hypercholesterolemia, unspecified: Secondary | ICD-10-CM | POA: Diagnosis not present

## 2021-11-01 DIAGNOSIS — I10 Essential (primary) hypertension: Secondary | ICD-10-CM | POA: Diagnosis not present

## 2021-11-01 DIAGNOSIS — K219 Gastro-esophageal reflux disease without esophagitis: Secondary | ICD-10-CM | POA: Diagnosis not present

## 2021-11-01 DIAGNOSIS — J4520 Mild intermittent asthma, uncomplicated: Secondary | ICD-10-CM | POA: Diagnosis not present

## 2021-11-01 DIAGNOSIS — Z90710 Acquired absence of both cervix and uterus: Secondary | ICD-10-CM | POA: Diagnosis not present

## 2021-11-01 DIAGNOSIS — D509 Iron deficiency anemia, unspecified: Secondary | ICD-10-CM | POA: Diagnosis not present

## 2021-11-01 DIAGNOSIS — I2510 Atherosclerotic heart disease of native coronary artery without angina pectoris: Secondary | ICD-10-CM | POA: Diagnosis not present

## 2021-11-01 DIAGNOSIS — M1990 Unspecified osteoarthritis, unspecified site: Secondary | ICD-10-CM | POA: Diagnosis not present

## 2021-11-01 DIAGNOSIS — M722 Plantar fascial fibromatosis: Secondary | ICD-10-CM | POA: Diagnosis not present

## 2021-11-01 DIAGNOSIS — G629 Polyneuropathy, unspecified: Secondary | ICD-10-CM | POA: Diagnosis not present

## 2021-11-01 DIAGNOSIS — Z9181 History of falling: Secondary | ICD-10-CM | POA: Diagnosis not present

## 2021-11-01 DIAGNOSIS — Z7982 Long term (current) use of aspirin: Secondary | ICD-10-CM | POA: Diagnosis not present

## 2021-11-13 ENCOUNTER — Telehealth: Payer: Self-pay | Admitting: Internal Medicine

## 2021-11-13 NOTE — Telephone Encounter (Signed)
Have you seen these orders?

## 2021-11-13 NOTE — Telephone Encounter (Signed)
Merleen Nicely From [facility] called in stating that her office faxed over orders on Nov 01, 2021 for Dr. Nicki Reaper to sign for pt Physical Therapy... Merleen Nicely stated that in there system that it states orders are pending signature... Merleen Nicely stated that they need orders signed before the end of the week, if not pt will be discharge from PT... Merleen Nicely is requesting callback at [phone]

## 2021-11-14 ENCOUNTER — Telehealth: Payer: Self-pay

## 2021-11-14 NOTE — Telephone Encounter (Signed)
Signed and faxed - per your note.

## 2021-11-14 NOTE — Telephone Encounter (Signed)
Placed in your box this AM

## 2021-11-14 NOTE — Telephone Encounter (Signed)
I do not have a copy of these orders. Please see if they will fax again. Please hold message until we receive.

## 2021-11-14 NOTE — Telephone Encounter (Signed)
Med list confirmed with pt daughter Amy. Updated in EMR, and attached to home care orders. Faxed.

## 2021-11-14 NOTE — Telephone Encounter (Signed)
Med list reviewed and updated with pt daughter Amy

## 2021-11-14 NOTE — Telephone Encounter (Signed)
Signed all pages except for medication list.  Please confirm meds taking and update our list.  Then I can sign and fax

## 2021-11-14 NOTE — Telephone Encounter (Signed)
I spoke with Patricia Pacheco at [facility] she will be re faxing the order. Thank you!

## 2021-11-21 ENCOUNTER — Other Ambulatory Visit: Payer: Self-pay | Admitting: Internal Medicine

## 2021-12-11 DIAGNOSIS — R202 Paresthesia of skin: Secondary | ICD-10-CM | POA: Diagnosis not present

## 2021-12-11 DIAGNOSIS — R7309 Other abnormal glucose: Secondary | ICD-10-CM | POA: Diagnosis not present

## 2021-12-11 DIAGNOSIS — R413 Other amnesia: Secondary | ICD-10-CM | POA: Diagnosis not present

## 2021-12-11 DIAGNOSIS — R200 Anesthesia of skin: Secondary | ICD-10-CM | POA: Diagnosis not present

## 2021-12-12 ENCOUNTER — Other Ambulatory Visit: Payer: Self-pay | Admitting: Neurology

## 2021-12-12 DIAGNOSIS — R413 Other amnesia: Secondary | ICD-10-CM

## 2021-12-19 ENCOUNTER — Other Ambulatory Visit (HOSPITAL_COMMUNITY): Payer: Self-pay | Admitting: Neurology

## 2021-12-19 DIAGNOSIS — R413 Other amnesia: Secondary | ICD-10-CM

## 2021-12-29 ENCOUNTER — Other Ambulatory Visit: Payer: Medicare Other

## 2022-01-02 ENCOUNTER — Ambulatory Visit: Payer: Medicare Other | Admitting: Internal Medicine

## 2022-02-20 DIAGNOSIS — R202 Paresthesia of skin: Secondary | ICD-10-CM | POA: Diagnosis not present

## 2022-02-20 DIAGNOSIS — R200 Anesthesia of skin: Secondary | ICD-10-CM | POA: Diagnosis not present

## 2022-03-13 DIAGNOSIS — R2689 Other abnormalities of gait and mobility: Secondary | ICD-10-CM | POA: Diagnosis not present

## 2022-03-13 DIAGNOSIS — R202 Paresthesia of skin: Secondary | ICD-10-CM | POA: Diagnosis not present

## 2022-03-13 DIAGNOSIS — R413 Other amnesia: Secondary | ICD-10-CM | POA: Diagnosis not present

## 2022-03-13 DIAGNOSIS — R200 Anesthesia of skin: Secondary | ICD-10-CM | POA: Diagnosis not present

## 2022-04-06 ENCOUNTER — Ambulatory Visit: Payer: Medicare Other | Admitting: Urology

## 2022-04-06 ENCOUNTER — Encounter: Payer: Self-pay | Admitting: Urology

## 2022-04-06 VITALS — BP 147/82 | HR 80 | Ht 62.0 in | Wt 125.0 lb

## 2022-04-06 DIAGNOSIS — R350 Frequency of micturition: Secondary | ICD-10-CM

## 2022-04-06 DIAGNOSIS — R300 Dysuria: Secondary | ICD-10-CM | POA: Diagnosis not present

## 2022-04-06 NOTE — Progress Notes (Signed)
04/06/2022 9:20 AM   Lalla H Tawil 1941/02/13 [id number]  Referring provider: Einar Pheasant, MD [address] [place] [address] [place],  [place] [address]  Chief Complaint  Patient presents with   Other    HPI: 81 y.o. female who presents 6 month follow-up  Refer to my prior office note 09/11/2021 Voiding symptoms improved with Myrbetriq samples and after completing the samples her symptoms did not return Cystoscopy 10/05/2021 was unremarkable She has done well without recurrent symptoms however over the past several days she thinks she may have an infection with complaints of increased frequency, urgency and mild dysuria   PMH: Past Medical History:  Diagnosis Date   Allergy    allergy shots (Juengel)   Anxiety    Arthritis    Asthma    CAD (coronary artery disease)    cath 11/27/04 - moderated LAD disease   Cancer ([place])    skin   GERD (gastroesophageal reflux disease)    EGD (05/25/04) - slight presbyesophagus, mild esophagitis, mild gastritis, mild duodenitis - positive H. pylori.    Neuropathy     Surgical History: Past Surgical History:  Procedure Laterality Date   ABDOMINAL HYSTERECTOMY     APPENDECTOMY  1967   COLONOSCOPY WITH PROPOFOL N/A 11/16/2019   Procedure: COLONOSCOPY WITH PROPOFOL;  Surgeon: Lin Landsman, MD;  Location: [facility] ENDOSCOPY;  Service: Gastroenterology;  Laterality: N/A;   ESOPHAGOGASTRODUODENOSCOPY (EGD) WITH PROPOFOL N/A 11/16/2019   Procedure: ESOPHAGOGASTRODUODENOSCOPY (EGD) WITH PROPOFOL;  Surgeon: Lin Landsman, MD;  Location: [facility] ENDOSCOPY;  Service: Gastroenterology;  Laterality: N/A;    Home Medications:  Allergies as of 04/06/2022       Reactions   Mushroom Extract Complex Hives   All Mushrooms         Medication List        Accurate as of April 06, 2022  9:20 AM. If you have any questions, ask your nurse or doctor.          ALPRAZolam 0.25 MG tablet Commonly known as: XANAX TAKE ONE TABLET  EVERY DAY AS NEEDED FOR ANXIETY   aspirin 81 MG tablet Take 81 mg by mouth daily.   CALCIUM 1200 PO Take by mouth daily.   celecoxib 200 MG capsule Commonly known as: CeleBREX Take 1 capsule (200 mg total) by mouth daily.   docusate sodium 100 MG capsule Commonly known as: COLACE Take by mouth.   fexofenadine 180 MG tablet Commonly known as: ALLEGRA Take 1 tablet (180 mg total) by mouth daily as needed for allergies or rhinitis.   FISH OIL PO Take by mouth daily.   FLUoxetine 20 MG capsule Commonly known as: PROZAC TAKE 1 CAPSULE BY MOUTH ONCE DAILY   hydrochlorothiazide 12.5 MG capsule Commonly known as: MICROZIDE TAKE 1 CAPSULE BY MOUTH DAILY.   multivitamin tablet Take 1 tablet by mouth daily.   omeprazole 40 MG capsule Commonly known as: PRILOSEC TAKE 1 CAPSULE BY MOUTH ONCE DAILY   ProAir HFA 108 (90 Base) MCG/ACT inhaler Generic drug: albuterol   PROBIOTIC PO Take by mouth.   triamcinolone cream 0.1 % Commonly known as: KENALOG   VITAMIN C PO Take by mouth daily.        Allergies:  Allergies  Allergen Reactions   Mushroom Extract Complex Hives    All Mushrooms     Family History: Family History  Problem Relation Age of Onset   Arthritis Mother    Stroke Sister    Breast cancer Neg Hx  Social History:  reports that she has never smoked. She has never used smokeless tobacco. She reports that she does not drink alcohol and does not use drugs.   Physical Exam: BP (!) 147/82   Pulse 80   Ht '5\' 2"'$  (1.575 m)   Wt 125 lb (56.7 kg)   LMP 08/29/1995   BMI 22.86 kg/m   Constitutional:  Alert and oriented, No acute distress. HEENT: [place] AT, moist mucus membranes.  Trachea midline, no masses. Cardiovascular: No clubbing, cyanosis, or edema. Respiratory: Normal respiratory effort, no increased work of breathing. Psychiatric: Normal mood and affect.    Assessment & Plan:   81 y.o. female with a history of storage related voiding  symptoms which are presently not bothersome Recent onset of mild dysuria and frequency She could not get a urine specimen in the office today and will bring 1 back in today or early next week and will call with results   Abbie Sons, [place] [address], [place], [place] [address] [phone]

## 2022-05-01 ENCOUNTER — Other Ambulatory Visit: Payer: Self-pay | Admitting: Internal Medicine

## 2022-05-07 IMAGING — MG MM DIGITAL DIAGNOSTIC UNILAT*L* W/ TOMO W/ CAD
4 series · 4 of 12 positions shown · non-contrast
Comparison: Previous exam(s).

CLINICAL DATA: Recall from screening mammography, possible one-view
asymmetry in the INNER subareolar LEFT breast visible only on the CC
view.

EXAM:
DIGITAL DIAGNOSTIC UNILATERAL LEFT MAMMOGRAM WITH TOMOSYNTHESIS AND
CAD
TECHNIQUE: Left digital diagnostic mammography and breast tomosynthesis was
performed. The images were evaluated with computer-aided detection.

[L ML synth-2D]
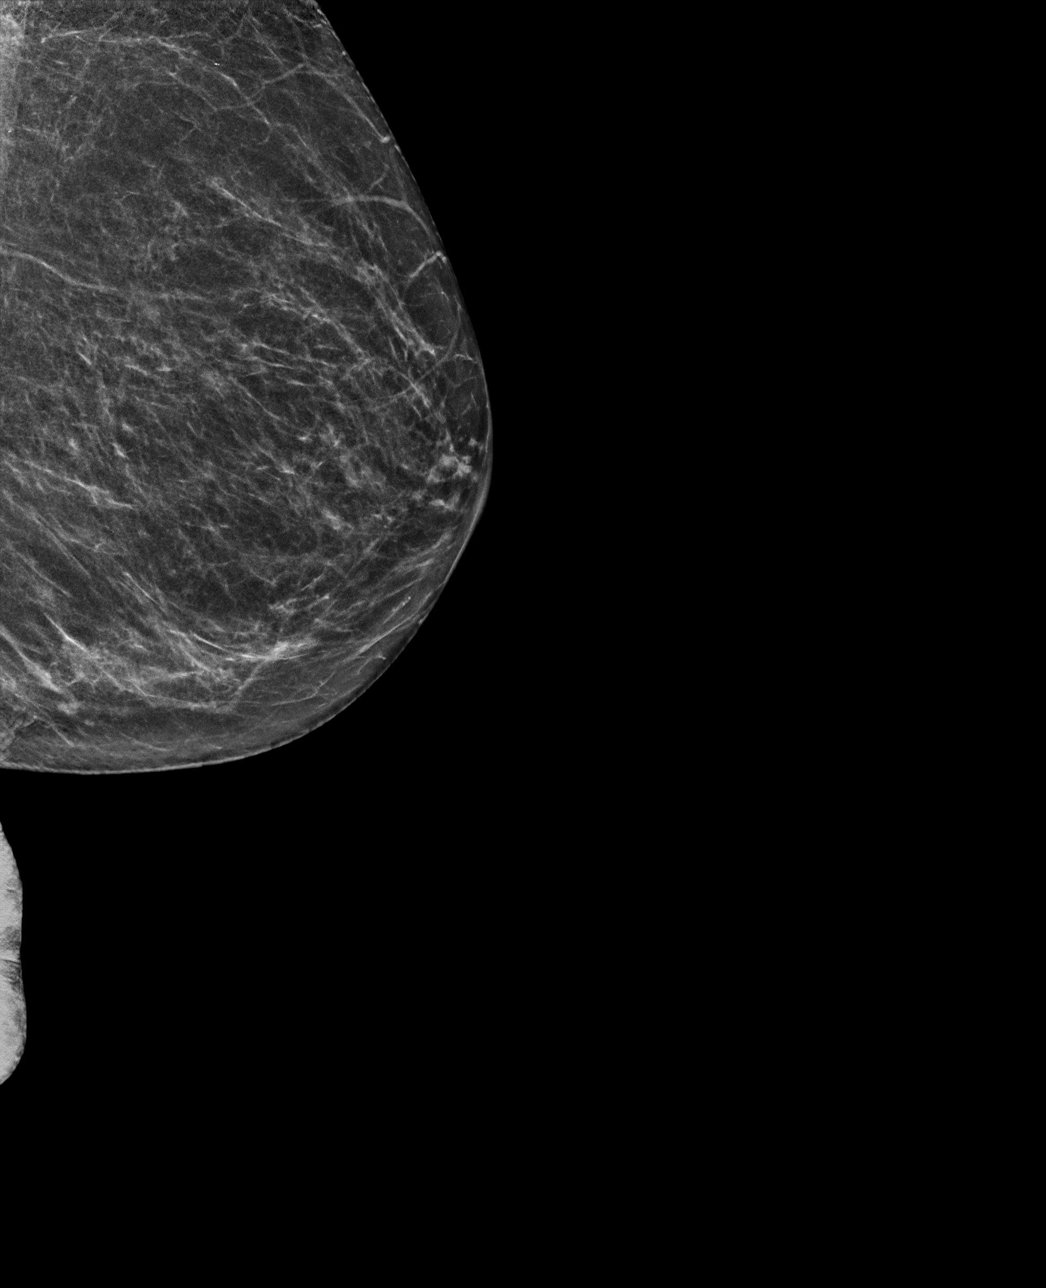

[L CC synth-2D]
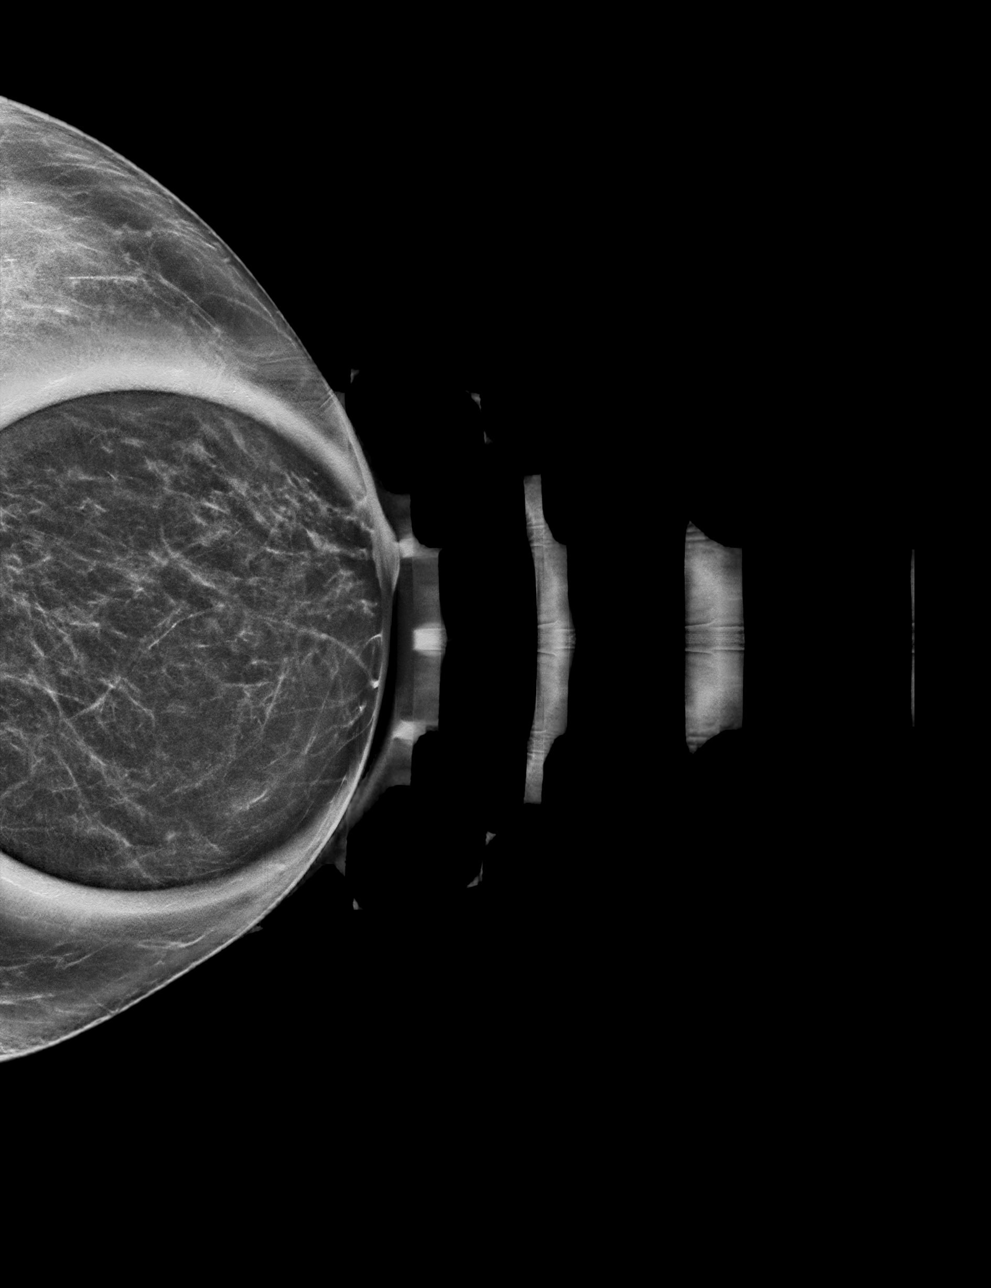

[L CC tomo · tomo slice 27/54.0]
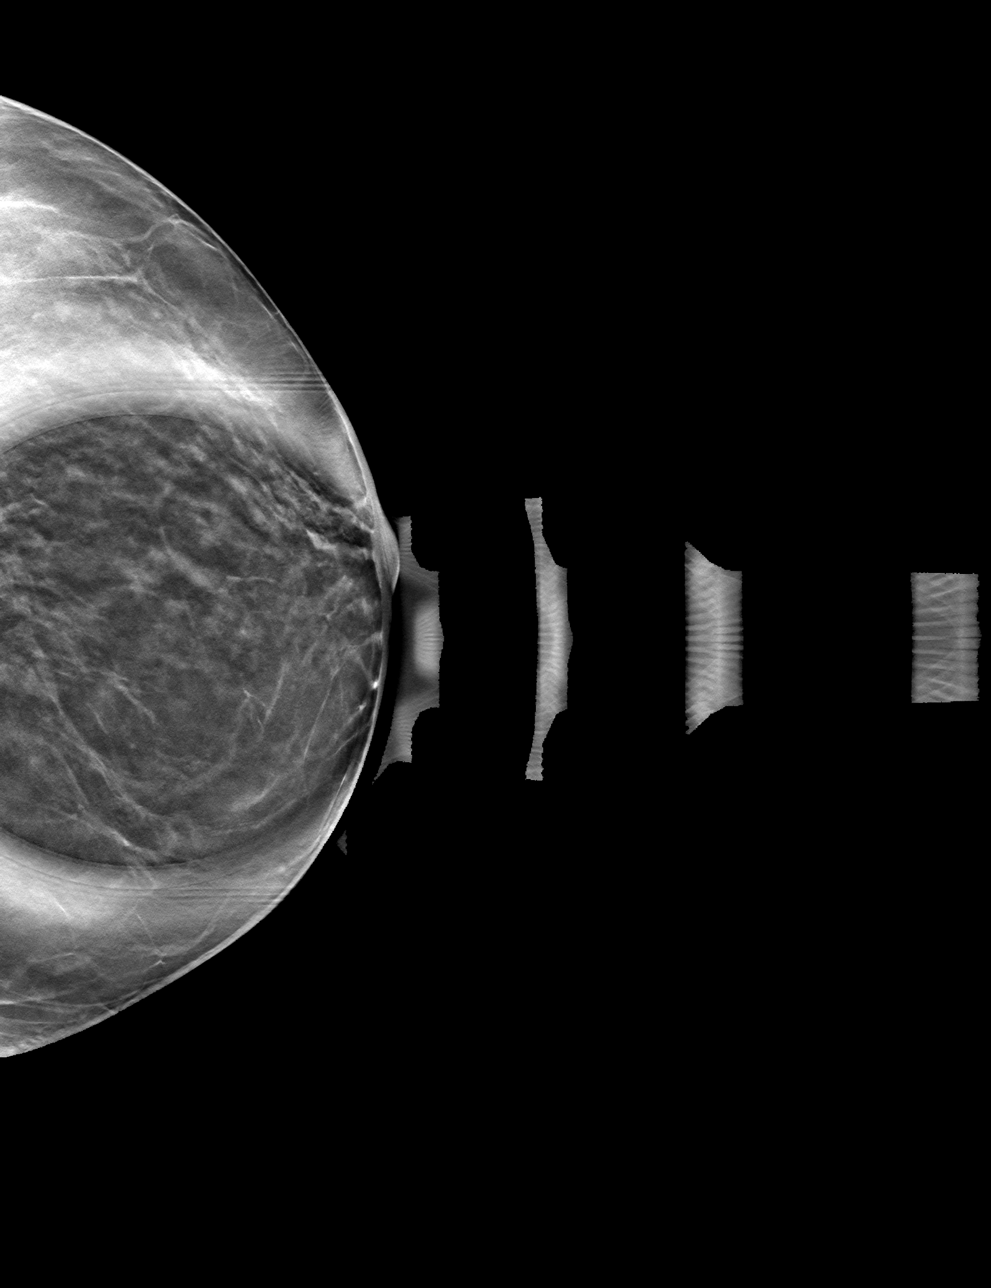

[L ML tomo · tomo slice 27/54.0]
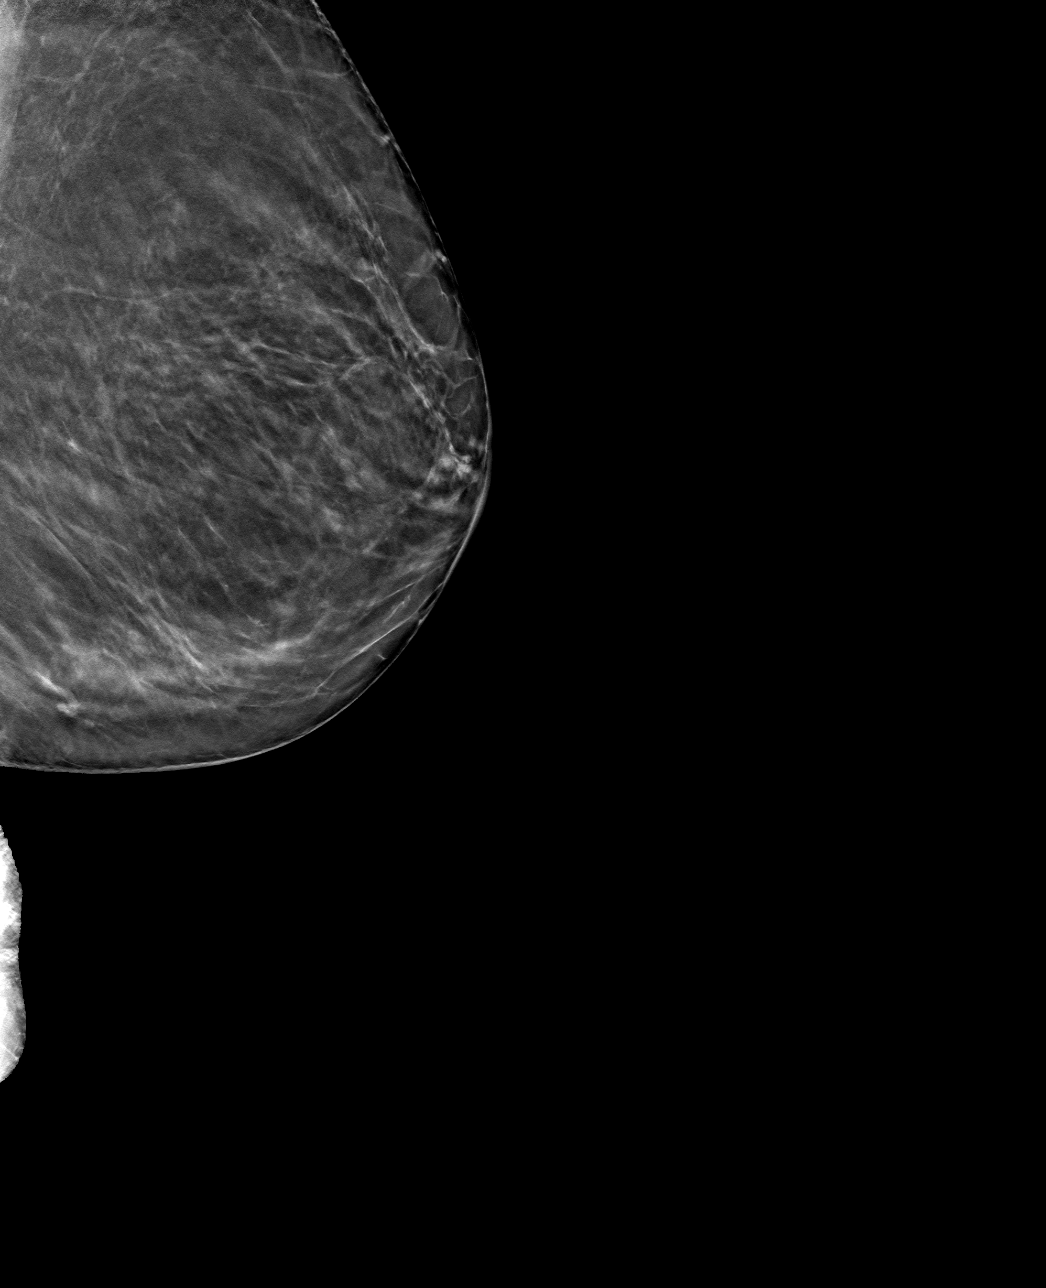

[4 of 12 positions shown; findings below may reference images not displayed]

ACR Breast Density Category b: There are scattered areas of
fibroglandular density.
FINDINGS: Spot-compression CC view of the area of concern and a full field
mediolateral view were obtained.

The asymmetry in the inner subareolar location questioned on
screening mammography disperses with compression, indicating
overlapping fibroglandular tissue. There is no underlying mass or
architectural distortion.

No findings suspicious for malignancy.
IMPRESSION: No mammographic evidence of malignancy involving the LEFT breast.

RECOMMENDATION:
Screening mammogram in one year.(Code:[id number]-[id number])

I have discussed the findings and recommendations with the patient.
If applicable, a reminder letter will be sent to the patient
regarding the next appointment.

BI-RADS CATEGORY  1: Negative.

## 2022-07-12 ENCOUNTER — Other Ambulatory Visit: Payer: Self-pay | Admitting: Internal Medicine

## 2022-08-21 DIAGNOSIS — C44722 Squamous cell carcinoma of skin of right lower limb, including hip: Secondary | ICD-10-CM | POA: Diagnosis not present

## 2022-08-21 DIAGNOSIS — L821 Other seborrheic keratosis: Secondary | ICD-10-CM | POA: Diagnosis not present

## 2022-08-21 DIAGNOSIS — L853 Xerosis cutis: Secondary | ICD-10-CM | POA: Diagnosis not present

## 2022-08-21 DIAGNOSIS — D485 Neoplasm of uncertain behavior of skin: Secondary | ICD-10-CM | POA: Diagnosis not present

## 2022-09-17 ENCOUNTER — Telehealth: Payer: Self-pay | Admitting: Internal Medicine

## 2022-09-17 NOTE — Telephone Encounter (Signed)
Pt need a refill on her BP [facility]

## 2022-09-18 ENCOUNTER — Other Ambulatory Visit: Payer: Self-pay

## 2022-09-18 MED ORDER — HYDROCHLOROTHIAZIDE 12.5 MG PO CAPS: 12.5000 mg | ORAL_CAPSULE | Freq: Every day | ORAL | 1 refills | Status: DC

## 2022-09-18 NOTE — Telephone Encounter (Signed)
Refill sent in

## 2022-09-20 DIAGNOSIS — C44722 Squamous cell carcinoma of skin of right lower limb, including hip: Secondary | ICD-10-CM | POA: Diagnosis not present

## 2022-10-04 DIAGNOSIS — L0390 Cellulitis, unspecified: Secondary | ICD-10-CM | POA: Diagnosis not present

## 2022-10-04 DIAGNOSIS — L905 Scar conditions and fibrosis of skin: Secondary | ICD-10-CM | POA: Diagnosis not present

## 2022-10-22 ENCOUNTER — Ambulatory Visit: Payer: Medicare Other | Admitting: Urology

## 2022-10-26 ENCOUNTER — Telehealth: Payer: Self-pay | Admitting: Internal Medicine

## 2022-10-26 NOTE — Telephone Encounter (Signed)
LMTCB. Needs appt with Dr Lorin Picket soon. Overdue to see her.

## 2022-10-26 NOTE — Telephone Encounter (Signed)
Patient last OV 10/18/21 need appt.

## 2022-10-26 NOTE — Telephone Encounter (Signed)
Pt scheduled 5/14 

## 2022-10-30 ENCOUNTER — Telehealth: Payer: Self-pay | Admitting: Internal Medicine

## 2022-10-30 NOTE — Telephone Encounter (Signed)
Copied from CRM [id number]. Topic: Medicare AWV >> Oct 30, 2022 11:21 AM Payton Doughty wrote: Reason for CRM: Called patient to schedule Medicare Annual Wellness Visit (AWV). No voicemail available to leave a message.  Last date of AWV: 11/09/21  Please schedule an appointment at any time with [facility].  If any questions, please contact me.  Thank you ,  Verlee Rossetti; Care Guide Ambulatory Clinical Support [place] [facility] Direct Dial: [phone]

## 2022-11-05 ENCOUNTER — Encounter: Payer: Self-pay | Admitting: Internal Medicine

## 2022-11-06 ENCOUNTER — Ambulatory Visit: Payer: Self-pay | Admitting: Internal Medicine

## 2022-11-13 ENCOUNTER — Ambulatory Visit (INDEPENDENT_AMBULATORY_CARE_PROVIDER_SITE_OTHER): Payer: Medicare Other | Admitting: Internal Medicine

## 2022-11-13 VITALS — BP 120/70 | HR 83 | Temp 97.9°F | Resp 16 | Ht 62.0 in | Wt 126.4 lb

## 2022-11-13 DIAGNOSIS — I10 Essential (primary) hypertension: Secondary | ICD-10-CM | POA: Diagnosis not present

## 2022-11-13 DIAGNOSIS — D509 Iron deficiency anemia, unspecified: Secondary | ICD-10-CM

## 2022-11-13 DIAGNOSIS — E7800 Pure hypercholesterolemia, unspecified: Secondary | ICD-10-CM | POA: Diagnosis not present

## 2022-11-13 DIAGNOSIS — R739 Hyperglycemia, unspecified: Secondary | ICD-10-CM

## 2022-11-13 DIAGNOSIS — F419 Anxiety disorder, unspecified: Secondary | ICD-10-CM

## 2022-11-13 DIAGNOSIS — G629 Polyneuropathy, unspecified: Secondary | ICD-10-CM | POA: Diagnosis not present

## 2022-11-13 DIAGNOSIS — K219 Gastro-esophageal reflux disease without esophagitis: Secondary | ICD-10-CM

## 2022-11-13 DIAGNOSIS — J4520 Mild intermittent asthma, uncomplicated: Secondary | ICD-10-CM

## 2022-11-13 LAB — LIPID PANEL
Cholesterol: 191 mg/dL (ref 0–200)
HDL: 54.6 mg/dL (ref >39.00)
LDL Cholesterol: 100 mg/dL — ABNORMAL HIGH (ref 0–99)
NonHDL: 136.84
Total CHOL/HDL Ratio: 4
Triglycerides: 186 mg/dL — ABNORMAL HIGH (ref 0.0–149.0)
VLDL: 37.2 mg/dL (ref 0.0–40.0)

## 2022-11-13 LAB — CBC WITH DIFFERENTIAL/PLATELET
Basophils Absolute: 0 10*3/uL (ref 0.0–0.1)
Basophils Relative: 0.7 % (ref 0.0–3.0)
Eosinophils Absolute: 0.1 10*3/uL (ref 0.0–0.7)
Eosinophils Relative: 2 % (ref 0.0–5.0)
HCT: 35.9 % — ABNORMAL LOW (ref 36.0–46.0)
Hemoglobin: 11.8 g/dL — ABNORMAL LOW (ref 12.0–15.0)
Lymphocytes Relative: 36 % (ref 12.0–46.0)
Lymphs Abs: 2.2 10*3/uL (ref 0.7–4.0)
MCHC: 32.9 g/dL (ref 30.0–36.0)
MCV: 91.7 fl (ref 78.0–100.0)
Monocytes Absolute: 0.5 10*3/uL (ref 0.1–1.0)
Monocytes Relative: 7.7 % (ref 3.0–12.0)
Neutro Abs: 3.3 10*3/uL (ref 1.4–7.7)
Neutrophils Relative %: 53.6 % (ref 43.0–77.0)
Platelets: 215 10*3/uL (ref 150.0–400.0)
RBC: 3.92 Mil/uL (ref 3.87–5.11)
RDW: 13.4 % (ref 11.5–15.5)
WBC: 6.2 10*3/uL (ref 4.0–10.5)

## 2022-11-13 LAB — IBC + FERRITIN
Ferritin: 65.8 ng/mL (ref 10.0–291.0)
Iron: 92 ug/dL (ref 42–145)
Saturation Ratios: 33.2 % (ref 20.0–50.0)
TIBC: 277.2 ug/dL (ref 250.0–450.0)
Transferrin: 198 mg/dL — ABNORMAL LOW (ref 212.0–360.0)

## 2022-11-13 LAB — BASIC METABOLIC PANEL
BUN: 23 mg/dL (ref 6–23)
CO2: 32 mEq/L (ref 19–32)
Calcium: 9.5 mg/dL (ref 8.4–10.5)
Chloride: 101 mEq/L (ref 96–112)
Creatinine, Ser: 0.97 mg/dL (ref 0.40–1.20)
GFR: 54.15 mL/min — ABNORMAL LOW (ref >60.00)
Glucose, Bld: 89 mg/dL (ref 70–99)
Potassium: 3.7 mEq/L (ref 3.5–5.1)
Sodium: 141 mEq/L (ref 135–145)

## 2022-11-13 LAB — HEMOGLOBIN A1C: Hgb A1c MFr Bld: 6 % (ref 4.6–6.5)

## 2022-11-13 LAB — HEPATIC FUNCTION PANEL
ALT: 11 U/L (ref 0–35)
AST: 16 U/L (ref 0–37)
Albumin: 4 g/dL (ref 3.5–5.2)
Alkaline Phosphatase: 78 U/L (ref 39–117)
Bilirubin, Direct: 0.1 mg/dL (ref 0.0–0.3)
Total Bilirubin: 0.4 mg/dL (ref 0.2–1.2)
Total Protein: 6.9 g/dL (ref 6.0–8.3)

## 2022-11-13 LAB — VITAMIN B12: Vitamin B-12: 586 pg/mL (ref 211–911)

## 2022-11-13 LAB — TSH: TSH: 1.78 u[IU]/mL (ref 0.35–5.50)

## 2022-11-13 NOTE — Patient Instructions (Signed)
Alpha lipoic acid 600mg  - take one per day.  (This is over the counter)  Can use vaseline around the leg lesion.  Call with update.

## 2022-11-13 NOTE — Progress Notes (Signed)
Subjective:    Patient ID: Patricia Pacheco, female    DOB: 29-Aug-1940, 82 y.o.   MRN: [id number]  Patient here for  Chief Complaint  Patient presents with   Medical Management of Chronic Issues    HPI Here to follow up regarding hypertension, anxiety and hypercholesterolemia.  Saw neurology - lower extremity pain/numbness/tingling.  Started on gabapentin 300mg  q hs.  Also recommended to start alpha lipoic acid.  Also evaluated for memory concerns.  Recommended MRI - neuroquant.  Also recommended starting aricept. 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  Did not tolerate gabapentin.  Prozac was stopped.  Recommended starting cymbalta.  Not on prozac and cymbalta now.  Does not feel needs anything more at this time.  Tries to stay active.  No chest pain or sob reported. No abdominal pain or bowel change reported.  No cough or congestion.  Had f/u with Dr Lonna Cobb 03/2022.  Stable.    Past Medical History:  Diagnosis Date   Allergy    allergy shots (Juengel)   Anxiety    Arthritis    Asthma    CAD (coronary artery disease)    cath 11/27/04 - moderated LAD disease   Cancer (HCC)    skin   GERD (gastroesophageal reflux disease)    EGD (05/25/04) - slight presbyesophagus, mild esophagitis, mild gastritis, mild duodenitis - positive H. pylori.    Neuropathy    Past Surgical History:  Procedure Laterality Date   ABDOMINAL HYSTERECTOMY     APPENDECTOMY  1967   COLONOSCOPY WITH PROPOFOL N/A 11/16/2019   Procedure: COLONOSCOPY WITH PROPOFOL;  Surgeon: Toney Reil, MD;  Location: [facility] ENDOSCOPY;  Service: Gastroenterology;  Laterality: N/A;   ESOPHAGOGASTRODUODENOSCOPY (EGD) WITH PROPOFOL N/A 11/16/2019   Procedure: ESOPHAGOGASTRODUODENOSCOPY (EGD) WITH PROPOFOL;  Surgeon: Toney Reil, MD;  Location: [facility] ENDOSCOPY;  Service: Gastroenterology;  Laterality: N/A;   Family History  Problem Relation Age of Onset   Arthritis Mother    Stroke Sister    Breast cancer  Neg Hx    Social History   Socioeconomic History   Marital status: Married    Spouse name: Not on file   Number of children: Not on file   Years of education: Not on file   Highest education level: Not on file  Occupational History   Not on file  Tobacco Use   Smoking status: Never   Smokeless tobacco: Never  Vaping Use   Vaping Use: Never used  Substance and Sexual Activity   Alcohol use: No    Alcohol/week: 0.0 standard drinks of alcohol   Drug use: No   Sexual activity: Not on file  Other Topics Concern   Not on file  Social History Narrative   Not on file   Social Determinants of Health   Financial Resource Strain: Low Risk  (10/24/2021)   Overall Financial Resource Strain (CARDIA)    Difficulty of Paying Living Expenses: Not hard at all  Food Insecurity: No Food Insecurity (10/24/2021)   Hunger Vital Sign    Worried About Running Out of Food in the Last Year: Never true    Ran Out of Food in the Last Year: Never true  Transportation Needs: No Transportation Needs (10/24/2021)   PRAPARE - Administrator, Civil Service (Medical): No    Lack of Transportation (Non-Medical): No  Physical Activity: Not on file  Stress: No Stress Concern Present (10/24/2021)   [employer] of Occupational Health - Occupational  Stress Questionnaire    Feeling of Stress : Not at all  Social Connections: Socially Integrated (10/24/2021)   Social Connection and Isolation Panel [NHANES]    Frequency of Communication with Friends and Family: More than three times a week    Frequency of Social Gatherings with Friends and Family: Twice a week    Attends Religious Services: More than 4 times per year    Active Member of [employer] or Organizations: Yes    Attends Banker Meetings: Not on file    Marital Status: Married     Review of Systems  Constitutional:  Negative for appetite change and unexpected weight change.  HENT:  Negative for congestion and sinus pressure.    Respiratory:  Negative for cough, chest tightness and shortness of breath.   Cardiovascular:  Negative for chest pain and palpitations.  Gastrointestinal:  Negative for abdominal pain, diarrhea, nausea and vomiting.  Genitourinary:  Negative for difficulty urinating and dysuria.  Musculoskeletal:  Negative for joint swelling and myalgias.  Skin:  Negative for color change and rash.  Neurological:  Negative for dizziness and headaches.  Psychiatric/Behavioral:  Negative for agitation and dysphoric mood.        Objective:     BP 120/70   Pulse 83   Temp 97.9 F (36.6 C)   Resp 16   Ht 5\' 2"  (1.575 m)   Wt 126 lb 6.4 oz (57.3 kg)   LMP 08/29/1995   SpO2 98%   BMI 23.12 kg/m  Wt Readings from Last 3 Encounters:  11/13/22 126 lb 6.4 oz (57.3 kg)  04/06/22 125 lb (56.7 kg)  10/24/21 128 lb (58.1 kg)    Physical Exam Vitals reviewed.  Constitutional:      General: She is not in acute distress.    Appearance: Normal appearance.  HENT:     Head: Normocephalic and atraumatic.     Right Ear: External ear normal.     Left Ear: External ear normal.  Eyes:     General: No scleral icterus.       Right eye: No discharge.        Left eye: No discharge.     Conjunctiva/sclera: Conjunctivae normal.  Neck:     Thyroid: No thyromegaly.  Cardiovascular:     Rate and Rhythm: Normal rate and regular rhythm.  Pulmonary:     Effort: No respiratory distress.     Breath sounds: Normal breath sounds. No wheezing.  Abdominal:     General: Bowel sounds are normal.     Palpations: Abdomen is soft.     Tenderness: There is no abdominal tenderness.  Musculoskeletal:        General: No swelling or tenderness.     Cervical back: Neck supple. No tenderness.  Lymphadenopathy:     Cervical: No cervical adenopathy.  Skin:    Findings: No erythema or rash.  Neurological:     Mental Status: She is alert.  Psychiatric:        Mood and Affect: Mood normal.        Behavior: Behavior normal.       Outpatient Encounter Medications as of 11/13/2022  Medication Sig   Ascorbic Acid (VITAMIN C PO) Take by mouth daily.   aspirin 81 MG tablet Take 81 mg by mouth daily.   Calcium Carbonate-Vit D-Min (CALCIUM 1200 PO) Take by mouth daily.   celecoxib (CELEBREX) 200 MG capsule Take 1 capsule (200 mg total) by mouth daily.   docusate  sodium (COLACE) 100 MG capsule Take by mouth.   fexofenadine (ALLEGRA) 180 MG tablet Take 1 tablet (180 mg total) by mouth daily as needed for allergies or rhinitis.   FLUoxetine (PROZAC) 20 MG capsule TAKE 1 CAPSULE BY MOUTH ONCE DAILY   hydrochlorothiazide (MICROZIDE) 12.5 MG capsule Take 1 capsule (12.5 mg total) by mouth daily.   Multiple Vitamin (MULTIVITAMIN) tablet Take 1 tablet by mouth daily.   Omega-3 Fatty Acids (FISH OIL PO) Take by mouth daily.   omeprazole (PRILOSEC) 40 MG capsule TAKE 1 CAPSULE BY MOUTH ONCE DAILY   PROAIR HFA 108 (90 Base) MCG/ACT inhaler    Probiotic Product (PROBIOTIC PO) Take by mouth.   triamcinolone cream (KENALOG) 0.1 %    [DISCONTINUED] ALPRAZolam (XANAX) 0.25 MG tablet TAKE ONE TABLET EVERY DAY AS NEEDED FOR ANXIETY   No facility-administered encounter medications on file as of 11/13/2022.     Lab Results  Component Value Date   WBC 6.2 11/13/2022   HGB 11.8 (L) 11/13/2022   HCT 35.9 (L) 11/13/2022   PLT 215.0 11/13/2022   GLUCOSE 89 11/13/2022   CHOL 191 11/13/2022   TRIG 186.0 (H) 11/13/2022   HDL 54.60 11/13/2022   LDLDIRECT 115.0 09/04/2012   LDLCALC 100 (H) 11/13/2022   ALT 11 11/13/2022   AST 16 11/13/2022   NA 141 11/13/2022   K 3.7 11/13/2022   CL 101 11/13/2022   CREATININE 0.97 11/13/2022   BUN 23 11/13/2022   CO2 32 11/13/2022   TSH 1.78 11/13/2022   HGBA1C 6.0 11/13/2022    Ultrasound renal complete  Result Date: 09/30/2021 CLINICAL DATA:  Microhematuria.  Pyuria. EXAM: RENAL / URINARY TRACT ULTRASOUND COMPLETE COMPARISON:  None. FINDINGS: Right Kidney: Renal measurements: 9.1 x 5.1 x  4.3 cm = volume: 104 mL. Echogenicity within normal limits. No mass or hydronephrosis visualized. Left Kidney: Renal measurements: 9.8 x 5.2 x 4.4 cm = volume: 117 mL. Echogenicity within normal limits. No mass or hydronephrosis visualized. Bladder: Appears normal for degree of bladder distention. Other: None. IMPRESSION: 1. Normal study.  No cause for symptoms identified. Electronically Signed   By: Gerome Sam III M.D.   On: 09/30/2021 12:27       Assessment & Plan:  Hypercholesterolemia Assessment & Plan: Have discussed calculated cholesterol risk.  She declines to start cholesterol medication.  Low cholesterol diet and exercise.  Follow lipid panel.    Orders: -     Hepatic function panel -     Lipid panel -     TSH  Essential hypertension Assessment & Plan: Blood pressure as outlined.  Continues on hctz.  Follow pressures.  Follow metabolic panel.   Orders: -     Basic metabolic panel  Iron deficiency anemia, unspecified iron deficiency anemia type Assessment & Plan: S/p EGD and colonoscopy.  Follow cbc and iron studies.    Orders: -     CBC with Differential/Platelet -     IBC + Ferritin  Hyperglycemia Assessment & Plan: Low carb diet and exercise.  Follow met b and A1c.   Orders: -     Hemoglobin A1c  Neuropathy Assessment & Plan: Trial of alpha lipoic acid.  Did not tolerate gabapentin.  Follow.   Orders: -     Vitamin B12  Anxiety Assessment & Plan: Off prozac.  Does not feel needs any further intervention.  Follow.    Mild intermittent asthma, unspecified whether complicated Assessment & Plan: Breathing stable.    Gastroesophageal reflux  disease, unspecified whether esophagitis present Assessment & Plan: No upper symptoms reported.  On prilosec.        Dale Bellewood, MD

## 2022-11-19 ENCOUNTER — Encounter: Payer: Self-pay | Admitting: Internal Medicine

## 2022-11-19 NOTE — Assessment & Plan Note (Signed)
No upper symptoms reported. On prilosec.  

## 2022-11-19 NOTE — Assessment & Plan Note (Signed)
S/p EGD and colonoscopy.  Follow cbc and iron studies.   ?

## 2022-11-19 NOTE — Assessment & Plan Note (Signed)
Trial of alpha lipoic acid.  Did not tolerate gabapentin.  Follow.

## 2022-11-19 NOTE — Assessment & Plan Note (Signed)
Off prozac.  Does not feel needs any further intervention.  Follow.

## 2022-11-19 NOTE — Assessment & Plan Note (Signed)
Breathing stable.

## 2022-11-19 NOTE — Assessment & Plan Note (Signed)
Low-carb diet and exercise.  Follow met b and A1c. 

## 2022-11-19 NOTE — Assessment & Plan Note (Signed)
Blood pressure as outlined.  Continues on hctz.  Follow pressures.  Follow metabolic panel.  

## 2022-11-19 NOTE — Assessment & Plan Note (Signed)
Have discussed calculated cholesterol risk.  She declines to start cholesterol medication.  Low cholesterol diet and exercise.  Follow lipid panel.  

## 2022-12-13 ENCOUNTER — Ambulatory Visit (INDEPENDENT_AMBULATORY_CARE_PROVIDER_SITE_OTHER): Payer: Medicare Other | Admitting: Family Medicine

## 2022-12-13 ENCOUNTER — Encounter: Payer: Self-pay | Admitting: Family Medicine

## 2022-12-13 VITALS — BP 120/82 | HR 80 | Temp 98.0°F | Ht 62.0 in | Wt 128.4 lb

## 2022-12-13 DIAGNOSIS — S81801A Unspecified open wound, right lower leg, initial encounter: Secondary | ICD-10-CM | POA: Diagnosis not present

## 2022-12-13 NOTE — Progress Notes (Signed)
   SUBJECTIVE:   Chief Complaint  Patient presents with   sore on left leg   HPI Patient presents to clinic with son for concern of sore on right lower leg x 2 months.  Reports removal of cancer in April 2024.  Has been followed by dermatology, Dr Cheree Ditto.  Has been improving but has not completed healed over and is still has scab over affected area.  Denies any fevers, pain, swelling, warmth to touch, drainage or increase in size.  NO recent trauma or injury.  Son reports that area is improving since procedure.  She has had no complaints this week of worsening symptoms but had been concerned last week so appointment made for evaluation.  PERTINENT PMH / PSH: S/p skin biopsy right lower extremity  OBJECTIVE:  BP 120/82   Pulse 80   Temp 98 F (36.7 C) (Oral)   Ht 5\' 2"  (1.575 m)   Wt 128 lb 6.4 oz (58.2 kg)   LMP 08/29/1995   SpO2 96%   BMI 23.48 kg/m    Physical Exam Vitals reviewed.  Constitutional:      General: She is not in acute distress.    Appearance: Normal appearance. She is normal weight. She is not ill-appearing, toxic-appearing or diaphoretic.  Eyes:     General:        Right eye: No discharge.        Left eye: No discharge.     Conjunctiva/sclera: Conjunctivae normal.  Cardiovascular:     Rate and Rhythm: Normal rate.  Pulmonary:     Effort: Pulmonary effort is normal.  Musculoskeletal:     Right lower leg: No swelling, tenderness or bony tenderness. No edema.     Left lower leg: No swelling, tenderness or bony tenderness. No edema.  Skin:    General: Skin is warm and dry.     Findings: Wound (healing scabbed post biopsy area to right lower extremity) present.  Neurological:     Mental Status: She is alert. Mental status is at baseline.  Psychiatric:        Mood and Affect: Mood normal.        Behavior: Behavior normal.     ASSESSMENT/PLAN:  Wound of right lower extremity, initial encounter Assessment & Plan: Slow healing wound s/p biopsy for  removal of skin cancer.  No concern for infection given afebrile, no drainage, erythema or warmth Reassurance provided Ensure adequate protein intake to promote healing Recommend she follow up with dermatology.      PDMP reviewed  Return if symptoms worsen or fail to improve, for PCP.  Dana Allan, MD

## 2022-12-13 NOTE — Patient Instructions (Addendum)
It was a pleasure meeting you today. Thank you for allowing me to take part in your health care.  Our goals for today as we discussed include:  Wound is healing  Continue to do what you are doing to help heal. Increase protein intake  If any increase pain, fevers, redness or swelling please notify MD  Follow up with PCP if no improvement   If you have any questions or concerns, please do not hesitate to call the office at [phone].  I look forward to our next visit and until then take care and stay safe.  Regards,   Dana Allan, MD   [facility]

## 2022-12-22 ENCOUNTER — Encounter: Payer: Self-pay | Admitting: Family Medicine

## 2022-12-22 DIAGNOSIS — S81801A Unspecified open wound, right lower leg, initial encounter: Secondary | ICD-10-CM | POA: Insufficient documentation

## 2022-12-22 NOTE — Assessment & Plan Note (Addendum)
Slow healing wound s/p biopsy for removal of skin cancer.  No concern for infection given afebrile, no drainage, erythema or warmth Reassurance provided Ensure adequate protein intake to promote healing Recommend she follow up with dermatology.

## 2023-01-02 ENCOUNTER — Telehealth: Payer: Self-pay | Admitting: Internal Medicine

## 2023-01-02 NOTE — Progress Notes (Unsigned)
Bethanie Dicker, NP-C Phone: [phone]  Patricia Pacheco is a 82 y.o. female who presents today for UTI symptoms.  UTI: Patient with Hx of UTIs, dysuria and polyuria. She does see Urology, last office visit in October 2023. Most recent symptoms began yesterday. She is also having mild low back pain. Dysuria- Yes  Frequency- Yes   Urgency- Yes   Hematuria- No   Fever- No  Abd pain- Yes, suprapubic pressure   Vaginal d/c- No   Social History   Tobacco Use  Smoking Status Never  Smokeless Tobacco Never    Current Outpatient Medications on File Prior to Visit  Medication Sig Dispense Refill   Ascorbic Acid (VITAMIN C PO) Take by mouth daily.     aspirin 81 MG tablet Take 81 mg by mouth daily.     Calcium Carbonate-Vit D-Min (CALCIUM 1200 PO) Take by mouth daily.     celecoxib (CELEBREX) 200 MG capsule Take 1 capsule (200 mg total) by mouth daily. 90 capsule 3   docusate sodium (COLACE) 100 MG capsule Take by mouth.     fexofenadine (ALLEGRA) 180 MG tablet Take 1 tablet (180 mg total) by mouth daily as needed for allergies or rhinitis. 30 tablet 0   FLUoxetine (PROZAC) 20 MG capsule TAKE 1 CAPSULE BY MOUTH ONCE DAILY (Patient not taking: Reported on 12/13/2022) 90 capsule 1   hydrochlorothiazide (MICROZIDE) 12.5 MG capsule Take 1 capsule (12.5 mg total) by mouth daily. 90 capsule 1   Multiple Vitamin (MULTIVITAMIN) tablet Take 1 tablet by mouth daily.     Omega-3 Fatty Acids (FISH OIL PO) Take by mouth daily.     omeprazole (PRILOSEC) 40 MG capsule TAKE 1 CAPSULE BY MOUTH ONCE DAILY 30 capsule 5   PROAIR HFA 108 (90 Base) MCG/ACT inhaler      Probiotic Product (PROBIOTIC PO) Take by mouth. (Patient not taking: Reported on 12/13/2022)     triamcinolone cream (KENALOG) 0.1 %      No current facility-administered medications on file prior to visit.    ROS see history of present illness  Objective  Physical Exam Vitals:   01/03/23 1538  BP: 126/80  Pulse: 84  Temp: 98.3 F (36.8  C)  SpO2: 98%    BP Readings from Last 3 Encounters:  01/03/23 126/80  12/13/22 120/82  11/13/22 120/70   Wt Readings from Last 3 Encounters:  01/03/23 127 lb (57.6 kg)  12/13/22 128 lb 6.4 oz (58.2 kg)  11/13/22 126 lb 6.4 oz (57.3 kg)    Physical Exam Constitutional:      General: She is not in acute distress.    Appearance: Normal appearance.  HENT:     Head: Normocephalic.  Cardiovascular:     Rate and Rhythm: Normal rate and regular rhythm.     Heart sounds: Normal heart sounds.  Pulmonary:     Effort: Pulmonary effort is normal.     Breath sounds: Normal breath sounds.  Abdominal:     General: Abdomen is flat. Bowel sounds are normal.     Palpations: Abdomen is soft. There is no mass.     Tenderness: There is abdominal tenderness (mild suprapubic discomfort).  Skin:    General: Skin is warm and dry.  Neurological:     General: No focal deficit present.     Mental Status: She is alert.  Psychiatric:        Mood and Affect: Mood normal.        Behavior: Behavior normal.  Assessment/Plan: Please see individual problem list.  Acute cystitis with hematuria Assessment & Plan: UA in office with moderate blood and trace leukocytes. Microscopic and culture pending. Last treated for UTI in March 2023 with Cipro 250 mg by Urology. Will treat today with Cipro 250 mg BID x 5 days. Will contact with culture results and if antibiotic needs to be changed. Advised adequate fluid intake. Return precautions given to patient. Encouraged to follow up with Urology.   Orders: -     Ciprofloxacin HCl; Take 1 tablet (250 mg total) by mouth 2 (two) times daily for 5 days.  Dispense: 10 tablet; Refill: 0  Dysuria -     POCT Urinalysis Dipstick (Automated) -     Urinalysis, Routine w reflex microscopic -     Urine Culture   Return if symptoms worsen or fail to improve.   Bethanie Dicker, NP-C [facility] [employer] [facility]

## 2023-01-02 NOTE — Telephone Encounter (Signed)
Called patient. She is having burning and frequency and a little lower back pain. Patient has been scheduled with [facility] tomorrow. Per Arville Lime, ok to schedule

## 2023-01-02 NOTE — Telephone Encounter (Signed)
Pt daughter called stating pt may have a UTI and want to leave a urine specimen

## 2023-01-03 ENCOUNTER — Ambulatory Visit (INDEPENDENT_AMBULATORY_CARE_PROVIDER_SITE_OTHER): Payer: Medicare Other | Admitting: Nurse Practitioner

## 2023-01-03 ENCOUNTER — Encounter: Payer: Self-pay | Admitting: Nurse Practitioner

## 2023-01-03 VITALS — BP 126/80 | HR 84 | Temp 98.3°F | Ht 62.0 in | Wt 127.0 lb

## 2023-01-03 DIAGNOSIS — N3001 Acute cystitis with hematuria: Secondary | ICD-10-CM

## 2023-01-03 DIAGNOSIS — R300 Dysuria: Secondary | ICD-10-CM

## 2023-01-03 LAB — POC URINALSYSI DIPSTICK (AUTOMATED)
Bilirubin, UA: NEGATIVE
Glucose, UA: NEGATIVE
Ketones, UA: NEGATIVE
Nitrite, UA: NEGATIVE
Protein, UA: NEGATIVE
Spec Grav, UA: 1.02 (ref 1.010–1.025)
Urobilinogen, UA: 0.2 E.U./dL
pH, UA: 5.5 (ref 5.0–8.0)

## 2023-01-03 MED ORDER — CIPROFLOXACIN HCL 250 MG PO TABS
250.0000 mg | ORAL_TABLET | Freq: Two times a day (BID) | ORAL | 0 refills | Status: AC
Start: 2023-01-03 — End: 2023-01-08

## 2023-01-03 NOTE — Assessment & Plan Note (Signed)
UA in office with moderate blood and trace leukocytes. Microscopic and culture pending. Last treated for UTI in March 2023 with Cipro 250 mg by Urology. Will treat today with Cipro 250 mg BID x 5 days. Will contact with culture results and if antibiotic needs to be changed. Advised adequate fluid intake. Return precautions given to patient. Encouraged to follow up with Urology.

## 2023-01-04 LAB — URINE CULTURE
MICRO NUMBER:: 15188162
Result:: NO GROWTH
SPECIMEN QUALITY:: ADEQUATE

## 2023-01-04 LAB — URINALYSIS, ROUTINE W REFLEX MICROSCOPIC
Bilirubin Urine: NEGATIVE
Ketones, ur: NEGATIVE
Nitrite: NEGATIVE
Specific Gravity, Urine: 1.02 (ref 1.000–1.030)
Total Protein, Urine: NEGATIVE
Urine Glucose: NEGATIVE
Urobilinogen, UA: 0.2 (ref 0.0–1.0)
pH: 5.5 (ref 5.0–8.0)

## 2023-01-07 ENCOUNTER — Telehealth: Payer: Self-pay

## 2023-01-07 NOTE — Telephone Encounter (Signed)
Called and LMOM for pt to CB in regards to Urine culture results.  I ended up calling the cell phone number that was listed as well and it was pts Daughetr Ms. Amy who is on her DPR.    I was able to give Ms. Amy the results and she verbalized understanding

## 2023-01-09 ENCOUNTER — Other Ambulatory Visit: Payer: Self-pay | Admitting: Nurse Practitioner

## 2023-01-09 ENCOUNTER — Encounter: Payer: Self-pay | Admitting: Nurse Practitioner

## 2023-01-09 ENCOUNTER — Other Ambulatory Visit: Payer: Self-pay

## 2023-01-09 DIAGNOSIS — R300 Dysuria: Secondary | ICD-10-CM

## 2023-01-09 MED ORDER — PHENAZOPYRIDINE HCL 100 MG PO TABS
100.0000 mg | ORAL_TABLET | Freq: Three times a day (TID) | ORAL | 0 refills | Status: DC | PRN
Start: 2023-01-09 — End: 2023-02-12

## 2023-01-09 NOTE — Telephone Encounter (Signed)
Pts UA and UC has been ordered

## 2023-01-11 ENCOUNTER — Ambulatory Visit: Payer: Medicare Other | Admitting: Internal Medicine

## 2023-01-11 ENCOUNTER — Other Ambulatory Visit: Payer: Self-pay

## 2023-01-11 ENCOUNTER — Other Ambulatory Visit: Payer: Medicare Other

## 2023-01-11 DIAGNOSIS — R300 Dysuria: Secondary | ICD-10-CM

## 2023-01-11 LAB — POCT URINALYSIS DIPSTICK
Bilirubin, UA: NEGATIVE
Glucose, UA: NEGATIVE
Leukocytes, UA: NEGATIVE
Nitrite, UA: NEGATIVE
Protein, UA: NEGATIVE
Spec Grav, UA: 1.025 (ref 1.010–1.025)
Urobilinogen, UA: 0.2 E.U./dL
pH, UA: 5 (ref 5.0–8.0)

## 2023-01-11 NOTE — Addendum Note (Signed)
Addended by: Warden Fillers on: 01/11/2023 11:17 AM   Modules accepted: Orders

## 2023-01-11 NOTE — Addendum Note (Signed)
Addended by: Warden Fillers on: 01/11/2023 11:16 AM   Modules accepted: Orders

## 2023-01-12 LAB — URINALYSIS, ROUTINE W REFLEX MICROSCOPIC
Bilirubin, UA: NEGATIVE
Glucose, UA: NEGATIVE
Nitrite, UA: NEGATIVE
RBC, UA: NEGATIVE
Urobilinogen, Ur: 0.2 mg/dL (ref 0.2–1.0)
pH, UA: 5 (ref 5.0–7.5)

## 2023-01-13 LAB — URINALYSIS, ROUTINE W REFLEX MICROSCOPIC
Leukocytes,UA: NEGATIVE
Specific Gravity, UA: >=1.03 — AB (ref 1.005–1.030)

## 2023-01-13 LAB — URINE CULTURE

## 2023-01-14 ENCOUNTER — Encounter: Payer: Self-pay | Admitting: Nurse Practitioner

## 2023-01-16 ENCOUNTER — Ambulatory Visit: Payer: Medicare Other | Admitting: Urology

## 2023-01-23 ENCOUNTER — Ambulatory Visit: Payer: Medicare Other | Admitting: Urology

## 2023-01-23 ENCOUNTER — Encounter: Payer: Self-pay | Admitting: Urology

## 2023-01-23 VITALS — BP 173/87 | HR 80 | Wt 129.0 lb

## 2023-01-23 DIAGNOSIS — R300 Dysuria: Secondary | ICD-10-CM

## 2023-01-23 DIAGNOSIS — R3129 Other microscopic hematuria: Secondary | ICD-10-CM

## 2023-01-23 LAB — URINALYSIS, COMPLETE
Bilirubin, UA: NEGATIVE
Glucose, UA: NEGATIVE
Ketones, UA: NEGATIVE
Leukocytes,UA: NEGATIVE
Nitrite, UA: NEGATIVE
Protein,UA: NEGATIVE
Specific Gravity, UA: <1.005 — ABNORMAL LOW (ref 1.005–1.030)
Urobilinogen, Ur: 0.2 mg/dL (ref 0.2–1.0)
pH, UA: 5.5 (ref 5.0–7.5)

## 2023-01-23 LAB — MICROSCOPIC EXAMINATION

## 2023-01-23 LAB — BLADDER SCAN AMB NON-IMAGING: PVR: 0 WU

## 2023-01-23 NOTE — Progress Notes (Signed)
I, Patricia Pacheco,acting as a scribe for Riki Altes, MD.,have documented all relevant documentation on the behalf of Riki Altes, MD,as directed by  Riki Altes, MD while in the presence of Riki Altes, MD.  01/23/2023 4:00 PM   Patricia Pacheco 1940-12-19 [id number]  Referring provider: Dale Fort Atkinson, MD [address] [place],  [place] [address]  Chief Complaint  Patient presents with   Dysuria    HPI: Patricia Pacheco is a 82 y.o. female who was asked to schedule a follow up by her PCP.  First seen in 2023 for pyuria with negative urine cultures and underwent a negative renal ultrasound and cystoscopy. Since her last visit, she has had 2 negative urine cultures. Urinalysis 01/03/23 showed 3-6 RBCs and a urine culture was negative. Urology follow up was recommended. A follow up urinalysis 01/11/2023 was negative along with a repeat urine culture. She presently is asymptomatic.   PMH: Past Medical History:  Diagnosis Date   Allergy    allergy shots (Juengel)   Anxiety    Arthritis    Asthma    CAD (coronary artery disease)    cath 11/27/04 - moderated LAD disease   Cancer (HCC)    skin   GERD (gastroesophageal reflux disease)    EGD (05/25/04) - slight presbyesophagus, mild esophagitis, mild gastritis, mild duodenitis - positive H. pylori.    Neuropathy     Surgical History: Past Surgical History:  Procedure Laterality Date   ABDOMINAL HYSTERECTOMY     APPENDECTOMY  1967   COLONOSCOPY WITH PROPOFOL N/A 11/16/2019   Procedure: COLONOSCOPY WITH PROPOFOL;  Surgeon: Toney Reil, MD;  Location[facility] ENDOSCOPY;  Service: Gastroenterology;  Laterality: N/A;   ESOPHAGOGASTRODUODENOSCOPY (EGD) WITH PROPOFOL N/A 11/16/2019   Procedure: ESOPHAGOGASTRODUODENOSCOPY (EGD) WITH PROPOFOL;  Surgeon: Toney Reil, MD;  Location: [facility] ENDOSCOPY;  Service: Gastroenterology;  Laterality: N/A;    Home Medications:  Allergies as of 01/23/2023        Reactions   Mushroom Extract Complex Hives   All Mushrooms         Medication List        Accurate as of January 23, 2023  4:00 PM. If you have any questions, ask your nurse or doctor.          aspirin 81 MG tablet Take 81 mg by mouth daily.   CALCIUM 1200 PO Take by mouth daily.   celecoxib 200 MG capsule Commonly known as: CeleBREX Take 1 capsule (200 mg total) by mouth daily.   docusate sodium 100 MG capsule Commonly known as: COLACE Take by mouth.   fexofenadine 180 MG tablet Commonly known as: ALLEGRA Take 1 tablet (180 mg total) by mouth daily as needed for allergies or rhinitis.   FISH OIL PO Take by mouth daily.   FLUoxetine 20 MG capsule Commonly known as: PROZAC TAKE 1 CAPSULE BY MOUTH ONCE DAILY   hydrochlorothiazide 12.5 MG capsule Commonly known as: MICROZIDE Take 1 capsule (12.5 mg total) by mouth daily.   multivitamin tablet Take 1 tablet by mouth daily.   omeprazole 40 MG capsule Commonly known as: PRILOSEC TAKE 1 CAPSULE BY MOUTH ONCE DAILY   phenazopyridine 100 MG tablet Commonly known as: Pyridium Take 1 tablet (100 mg total) by mouth 3 (three) times daily as needed.   ProAir HFA 108 (90 Base) MCG/ACT inhaler Generic drug: albuterol   PROBIOTIC PO Take by mouth.   triamcinolone cream 0.1 % Commonly  known as: KENALOG   VITAMIN C PO Take by mouth daily.        Allergies:  Allergies  Allergen Reactions   Mushroom Extract Complex Hives    All Mushrooms     Family History: Family History  Problem Relation Age of Onset   Arthritis Mother    Stroke Sister    Breast cancer Neg Hx     Social History:  reports that she has never smoked. She has never used smokeless tobacco. She reports that she does not drink alcohol and does not use drugs.   Physical Exam: BP (!) 173/87   Pulse 80   Wt 129 lb (58.5 kg)   LMP 08/29/1995   BMI 23.59 kg/m   Constitutional:  Alert and oriented, No acute distress. HEENT: [place]  AT Respiratory: Normal respiratory effort, no increased work of breathing. Psychiatric: Normal mood and affect.   Laboratory Data:  Urinalysis 2++ blood dipstick/microscopy negative    Assessment & Plan:    82 year old female with a history of overactive bladder symptoms and pyuria, who has had a negative renal ultrasound and cystoscopy. Urine cultures hav been negative. UA today is clear.  Recent UA with minimal microhematuria 3-6 RBCs however she has been recently evaluated with upper tract imaging and cystoscopy She has had occasional UTI and we discussed supplements for UTI prevention, including cranberry and D-mannose. 1 year follow-up instructed to call earlier for gross hematuria, worsening voiding symptoms or recurring infections  I have reviewed the above documentation for accuracy and completeness, and I agree with the above.   Riki Altes, MD  [facility] [address], [address] [place], [place] [address] [phone]

## 2023-01-23 NOTE — Patient Instructions (Signed)
  For UTI prevention take cranberry tablets or D-Mannose daily.

## 2023-01-25 ENCOUNTER — Telehealth: Payer: Self-pay | Admitting: Urology

## 2023-01-25 ENCOUNTER — Encounter: Payer: Self-pay | Admitting: Urology

## 2023-01-25 NOTE — Telephone Encounter (Signed)
Left message to call back  

## 2023-01-25 NOTE — Telephone Encounter (Signed)
Urinalysis was negative for infection or RBCs.  I had discussed with patient starting low-dose vaginal estrogen for UTI prevention however on review of her chart she has not had positive urine cultures and would not recommend low-dose estrogen therapy.

## 2023-01-28 NOTE — Telephone Encounter (Signed)
Notified patient as instructed, patient pleased °

## 2023-02-12 ENCOUNTER — Other Ambulatory Visit (HOSPITAL_COMMUNITY)
Admission: RE | Admit: 2023-02-12 | Discharge: 2023-02-12 | Disposition: A | Discharge status: Home / Self Care | Payer: Medicare Other | Source: Ambulatory Visit | Attending: Family | Admitting: Family

## 2023-02-12 ENCOUNTER — Encounter: Payer: Self-pay | Admitting: Family

## 2023-02-12 ENCOUNTER — Telehealth: Payer: Self-pay | Admitting: Internal Medicine

## 2023-02-12 ENCOUNTER — Other Ambulatory Visit: Payer: Medicare Other

## 2023-02-12 ENCOUNTER — Ambulatory Visit (INDEPENDENT_AMBULATORY_CARE_PROVIDER_SITE_OTHER): Payer: Medicare Other | Admitting: Family

## 2023-02-12 VITALS — BP 118/70 | HR 82 | Temp 97.8°F | Ht 62.0 in | Wt 125.6 lb

## 2023-02-12 DIAGNOSIS — Z1151 Encounter for screening for human papillomavirus (HPV): Secondary | ICD-10-CM | POA: Diagnosis not present

## 2023-02-12 DIAGNOSIS — Z01411 Encounter for gynecological examination (general) (routine) with abnormal findings: Secondary | ICD-10-CM | POA: Diagnosis not present

## 2023-02-12 DIAGNOSIS — N898 Other specified noninflammatory disorders of vagina: Secondary | ICD-10-CM

## 2023-02-12 DIAGNOSIS — R399 Unspecified symptoms and signs involving the genitourinary system: Secondary | ICD-10-CM | POA: Diagnosis not present

## 2023-02-12 DIAGNOSIS — R300 Dysuria: Secondary | ICD-10-CM | POA: Insufficient documentation

## 2023-02-12 DIAGNOSIS — Z01419 Encounter for gynecological examination (general) (routine) without abnormal findings: Secondary | ICD-10-CM | POA: Insufficient documentation

## 2023-02-12 DIAGNOSIS — B3731 Acute candidiasis of vulva and vagina: Secondary | ICD-10-CM | POA: Diagnosis not present

## 2023-02-12 LAB — POCT URINALYSIS DIPSTICK
Bilirubin, UA: NEGATIVE
Glucose, UA: NEGATIVE
Ketones, UA: NEGATIVE
Leukocytes, UA: NEGATIVE
Nitrite, UA: NEGATIVE
Protein, UA: NEGATIVE
Spec Grav, UA: 1.01 (ref 1.010–1.025)
Urobilinogen, UA: 0.2 E.U./dL
pH, UA: 6 (ref 5.0–8.0)

## 2023-02-12 MED ORDER — TERCONAZOLE 0.8 % VA CREA: 1.0000 | TOPICAL_CREAM | Freq: Every day | VAGINAL | 0 refills | Status: AC

## 2023-02-12 MED ORDER — FLUCONAZOLE 150 MG PO TABS: 150.0000 mg | ORAL_TABLET | Freq: Every day | ORAL | 0 refills | Status: DC

## 2023-02-12 NOTE — Telephone Encounter (Signed)
Per appt note, it states she is coming in for UTI.  Appt today at 10:00 am. I do not see a phone message regarding symptoms, etc.  I am not sure why she is coming in.  Please clarify. If symptoms, needs to be seen.

## 2023-02-12 NOTE — Telephone Encounter (Signed)
Received phone call from Dr. Lorin Picket that pt was on lab schedule and needed orders.  If pt had suspected UTI and yeast infection needed to be seen.  Called pt and scheduled her with Worthy Rancher, NP for 10am today.  Dr. Lorin Picket and lab notified.

## 2023-02-12 NOTE — Progress Notes (Signed)
Acute Office Visit  Subjective:     Patient ID: Patricia Pacheco, female    DOB: 1940/07/22, 82 y.o.   MRN: [id number]  Chief Complaint  Patient presents with  . Urinary Tract Infection    Vaginal yeast    HPI 82 year old female, patient of Dr. Lorin Picket is in today with complaints of vaginal itching, burning, and irritation.  She denies any vaginal discharge or odor.  Denies being sexually active.  Has been tested several times for urinary tract infections and they have been negative.  The burning is constant but worse with urination.  Review of Systems  Constitutional: Negative.   Respiratory: Negative.    Cardiovascular: Negative.   Gastrointestinal: Negative.   Genitourinary:  Positive for dysuria. Negative for hematuria.       Vaginal itching and irritation  Musculoskeletal: Negative.   Psychiatric/Behavioral: Negative.    All other systems reviewed and are negative. Past Medical History:  Diagnosis Date  . Allergy    allergy shots (Juengel)  . Anxiety   . Arthritis   . Asthma   . CAD (coronary artery disease)    cath 11/27/04 - moderated LAD disease  . Cancer (HCC)    skin  . GERD (gastroesophageal reflux disease)    EGD (05/25/04) - slight presbyesophagus, mild esophagitis, mild gastritis, mild duodenitis - positive H. pylori.   Marland Kitchen Neuropathy     Social History   Socioeconomic History  . Marital status: Married    Spouse name: Not on file  . Number of children: Not on file  . Years of education: Not on file  . Highest education level: Not on file  Occupational History  . Not on file  Tobacco Use  . Smoking status: Never  . Smokeless tobacco: Never  Vaping Use  . Vaping status: Never Used  Substance and Sexual Activity  . Alcohol use: No    Alcohol/week: 0.0 standard drinks of alcohol  . Drug use: No  . Sexual activity: Not on file  Other Topics Concern  . Not on file  Social History Narrative  . Not on file   Social Determinants of Health   Financial  Resource Strain: Low Risk  (10/24/2021)   Overall Financial Resource Strain (CARDIA)   . Difficulty of Paying Living Expenses: Not hard at all  Food Insecurity: No Food Insecurity (10/24/2021)   Hunger Vital Sign   . Worried About Programme researcher, broadcasting/film/video in the Last Year: Never true   . Ran Out of Food in the Last Year: Never true  Transportation Needs: No Transportation Needs (10/24/2021)   PRAPARE - Transportation   . Lack of Transportation (Medical): No   . Lack of Transportation (Non-Medical): No  Physical Activity: Not on file  Stress: No Stress Concern Present (10/24/2021)   [employer] of Occupational Health - Occupational Stress Questionnaire   . Feeling of Stress : Not at all  Social Connections: Socially Integrated (10/24/2021)   Social Connection and Isolation Panel [NHANES]   . Frequency of Communication with Friends and Family: More than three times a week   . Frequency of Social Gatherings with Friends and Family: Twice a week   . Attends Religious Services: More than 4 times per year   . Active Member of Clubs or Organizations: Yes   . Attends Banker Meetings: Not on file   . Marital Status: Married  Catering manager Violence: Not At Risk (10/24/2021)   Humiliation, Afraid, Rape, and Kick questionnaire   .  Fear of Current or Ex-Partner: No   . Emotionally Abused: No   . Physically Abused: No   . Sexually Abused: No    Past Surgical History:  Procedure Laterality Date  . ABDOMINAL HYSTERECTOMY    . APPENDECTOMY  1967  . COLONOSCOPY WITH PROPOFOL N/A 11/16/2019   Procedure: COLONOSCOPY WITH PROPOFOL;  Surgeon: Toney Reil, MD;  Location: [facility] ENDOSCOPY;  Service: Gastroenterology;  Laterality: N/A;  . ESOPHAGOGASTRODUODENOSCOPY (EGD) WITH PROPOFOL N/A 11/16/2019   Procedure: ESOPHAGOGASTRODUODENOSCOPY (EGD) WITH PROPOFOL;  Surgeon: Toney Reil, MD;  Location: [facility] ENDOSCOPY;  Service: Gastroenterology;  Laterality: N/A;    Family History   Problem Relation Age of Onset  . Arthritis Mother   . Stroke Sister   . Breast cancer Neg Hx     Allergies  Allergen Reactions  . Mushroom Extract Complex Hives    All Mushrooms     Current Outpatient Medications on File Prior to Visit  Medication Sig Dispense Refill  . Ascorbic Acid (VITAMIN C PO) Take by mouth daily.    Marland Kitchen aspirin 81 MG tablet Take 81 mg by mouth daily.    . Calcium Carbonate-Vit D-Min (CALCIUM 1200 PO) Take by mouth daily.    . celecoxib (CELEBREX) 200 MG capsule Take 1 capsule (200 mg total) by mouth daily. 90 capsule 3  . Multiple Vitamin (MULTIVITAMIN) tablet Take 1 tablet by mouth daily.    . Omega-3 Fatty Acids (FISH OIL PO) Take by mouth daily.    . Probiotic Product (PROBIOTIC PO) Take by mouth.    . fexofenadine (ALLEGRA) 180 MG tablet Take 1 tablet (180 mg total) by mouth daily as needed for allergies or rhinitis. (Patient not taking: Reported on 02/12/2023) 30 tablet 0  . FLUoxetine (PROZAC) 20 MG capsule TAKE 1 CAPSULE BY MOUTH ONCE DAILY (Patient not taking: Reported on 02/12/2023) 90 capsule 1   No current facility-administered medications on file prior to visit.    BP 118/70   Pulse 82   Temp 97.8 F (36.6 C) (Oral)   Ht 5\' 2"  (1.575 m)   Wt 125 lb 9.6 oz (57 kg)   LMP 08/29/1995   SpO2 96%   BMI 22.97 kg/m chart      Objective:    BP 118/70   Pulse 82   Temp 97.8 F (36.6 C) (Oral)   Ht 5\' 2"  (1.575 m)   Wt 125 lb 9.6 oz (57 kg)   LMP 08/29/1995   SpO2 96%   BMI 22.97 kg/m    Physical Exam Vitals and nursing note reviewed. Exam conducted with a chaperone present.  Constitutional:      Appearance: Normal appearance.  Cardiovascular:     Rate and Rhythm: Normal rate and regular rhythm.     Pulses: Normal pulses.     Heart sounds: Normal heart sounds.  Pulmonary:     Effort: Pulmonary effort is normal.     Breath sounds: Normal breath sounds.  Genitourinary:    Comments: Smegma noted externally.  Vaginal dryness.  Mild  erythema.  Internal exam within normal limits.  No cervical motion tenderness.  No discharge. Musculoskeletal:     Cervical back: Normal range of motion.  Skin:    General: Skin is warm and dry.  Neurological:     General: No focal deficit present.     Mental Status: She is alert and oriented to person, place, and time. Mental status is at baseline.  Psychiatric:  Mood and Affect: Mood normal.        Behavior: Behavior normal.   No results found for any visits on 02/12/23.      Assessment & Plan:   Problem List Items Addressed This Visit   None Visit Diagnoses     UTI symptoms    -  Primary   Relevant Orders   POCT Urinalysis Dipstick   Vaginal irritation       Relevant Orders   Cytology - PAP   Candida vaginitis       Relevant Medications   fluconazole (DIFLUCAN) 150 MG tablet       Meds ordered this encounter  Medications  . terconazole (TERAZOL 3) 0.8 % vaginal cream    Sig: Place 1 applicator vaginally at bedtime.    Dispense:  20 g    Refill:  0  . fluconazole (DIFLUCAN) 150 MG tablet    Sig: Take 1 tablet (150 mg total) by mouth daily.    Dispense:  1 tablet    Refill:  0   Call the office if symptoms worsen or persist.  Recheck as scheduled and sooner as needed.  Urine attempted to be obtained in the clinic today will notify patient pending results. No follow-ups on file.  Eulis Foster, FNP

## 2023-02-12 NOTE — Telephone Encounter (Signed)
Patient need lab orders.

## 2023-02-13 LAB — CYTOLOGY - PAP
Comment: NEGATIVE
Diagnosis: NEGATIVE
High risk HPV: NEGATIVE

## 2023-03-20 ENCOUNTER — Ambulatory Visit (INDEPENDENT_AMBULATORY_CARE_PROVIDER_SITE_OTHER): Payer: Medicare Other | Admitting: Family

## 2023-03-20 ENCOUNTER — Other Ambulatory Visit (HOSPITAL_COMMUNITY)
Admission: RE | Admit: 2023-03-20 | Discharge: 2023-03-20 | Disposition: A | Discharge status: Home / Self Care | Payer: Medicare Other | Source: Ambulatory Visit | Attending: Family | Admitting: Family

## 2023-03-20 ENCOUNTER — Encounter: Payer: Self-pay | Admitting: Family

## 2023-03-20 VITALS — BP 140/80 | HR 86 | Temp 98.1°F | Resp 17 | Ht 62.0 in | Wt 126.2 lb

## 2023-03-20 DIAGNOSIS — R300 Dysuria: Secondary | ICD-10-CM | POA: Diagnosis not present

## 2023-03-20 DIAGNOSIS — N898 Other specified noninflammatory disorders of vagina: Secondary | ICD-10-CM | POA: Insufficient documentation

## 2023-03-20 LAB — URINALYSIS, ROUTINE W REFLEX MICROSCOPIC
Bilirubin Urine: NEGATIVE
Nitrite: NEGATIVE
Specific Gravity, Urine: 1.025 (ref 1.000–1.030)
Total Protein, Urine: NEGATIVE
Urine Glucose: NEGATIVE
Urobilinogen, UA: 0.2 (ref 0.0–1.0)
pH: 5.5 (ref 5.0–8.0)

## 2023-03-20 LAB — POC URINALSYSI DIPSTICK (AUTOMATED)
Bilirubin, UA: NEGATIVE
Glucose, UA: NEGATIVE
Nitrite, UA: NEGATIVE
Protein, UA: NEGATIVE
Spec Grav, UA: 1.025 (ref 1.010–1.025)
Urobilinogen, UA: 0.2 E.U./dL
pH, UA: 5.5 (ref 5.0–8.0)

## 2023-03-20 NOTE — Patient Instructions (Signed)
As discussed, I would collect a sample today to screen for urinary tract infection or yeast infection.  I am not so sure that you do not have vaginal atrophy which is not necessarily infection but it is due to loss of estrogen.  me We will wait for lab results. Very nice meeting you!

## 2023-03-20 NOTE — Assessment & Plan Note (Signed)
Pending wet prep, urinalysis and urine culture.  If negative for Candida, I do question if vaginal atrophy is playing a role.  Briefly discussed role of vaginal Estrace.  Patient comfortable with waiting on labs

## 2023-03-20 NOTE — Progress Notes (Signed)
Assessment & Plan:  Vaginal itching Assessment & Plan: Pending wet prep, urinalysis and urine culture.  If negative for Candida, I do question if vaginal atrophy is playing a role.  Briefly discussed role of vaginal Estrace.  Patient comfortable with waiting on labs  Orders: -     Cervicovaginal ancillary only  Burning with urination -     POCT Urinalysis Dipstick (Automated) -     Urinalysis, Routine w reflex microscopic -     Urine Culture     Return precautions given.   Risks, benefits, and alternatives of the medications and treatment plan prescribed today were discussed, and patient expressed understanding.   Education regarding symptom management and diagnosis given to patient on AVS either electronically or printed.  No follow-ups on file.  Rennie Plowman, FNP  Subjective:    Patient ID: Patricia Pacheco, female    DOB: 1941/06/12, 82 y.o.   MRN: [id number]  CC: Patricia Pacheco is a 82 y.o. female who presents today for an acute visit.    HPI: She complains of vaginal itching.   No increased vaginal discharge  Over the past few days she has had episodic dysuria.  No hematuria, flank pain, nausea vomiting or fever  She showers often daily, sometimes twice daily. She uses clean wash cloth in vaginal area.   No feminine sprays, douching.   She is not sexually active.          Seen by colleague 02/12/2023 for UTI symptoms and vaginitis.  Prescribed Diflucan.  Moderate blood in UA.  Negative nitrites and leukocytes Pap smear negative HPV, malignancy Previous urine culture without infection 01/11/2023  H/o CKD  No h/o  breast cancer    Allergies: Mushroom extract complex Current Outpatient Medications on File Prior to Visit  Medication Sig Dispense Refill   Ascorbic Acid (VITAMIN C PO) Take by mouth daily.     aspirin 81 MG tablet Take 81 mg by mouth daily.     Calcium Carbonate-Vit D-Min (CALCIUM 1200 PO) Take by mouth daily.     celecoxib (CELEBREX) 200  MG capsule Take 1 capsule (200 mg total) by mouth daily. 90 capsule 3   fexofenadine (ALLEGRA) 180 MG tablet Take 1 tablet (180 mg total) by mouth daily as needed for allergies or rhinitis. (Patient not taking: Reported on 02/12/2023) 30 tablet 0   fluconazole (DIFLUCAN) 150 MG tablet Take 1 tablet (150 mg total) by mouth daily. 1 tablet 0   FLUoxetine (PROZAC) 20 MG capsule TAKE 1 CAPSULE BY MOUTH ONCE DAILY (Patient not taking: Reported on 02/12/2023) 90 capsule 1   Multiple Vitamin (MULTIVITAMIN) tablet Take 1 tablet by mouth daily.     Omega-3 Fatty Acids (FISH OIL PO) Take by mouth daily.     Probiotic Product (PROBIOTIC PO) Take by mouth.     terconazole (TERAZOL 3) 0.8 % vaginal cream Place 1 applicator vaginally at bedtime. 20 g 0   No current facility-administered medications on file prior to visit.    Review of Systems  Constitutional:  Negative for chills and fever.  Respiratory:  Negative for cough.   Cardiovascular:  Negative for chest pain and palpitations.  Gastrointestinal:  Negative for nausea and vomiting.      Objective:    BP (!) 140/80   Pulse 86   Temp 98.1 F (36.7 C) (Oral)   Resp 17   Ht 5\' 2"  (1.575 m)   Wt 126 lb 4 oz (57.3 kg)   LMP  08/29/1995   SpO2 97%   BMI 23.09 kg/m   BP Readings from Last 3 Encounters:  03/20/23 (!) 140/80  02/12/23 118/70  01/23/23 (!) 173/87   Wt Readings from Last 3 Encounters:  03/20/23 126 lb 4 oz (57.3 kg)  02/12/23 125 lb 9.6 oz (57 kg)  01/23/23 129 lb (58.5 kg)    Physical Exam Vitals reviewed.  Constitutional:      Appearance: She is well-developed.  Eyes:     Conjunctiva/sclera: Conjunctivae normal.  Cardiovascular:     Rate and Rhythm: Normal rate and regular rhythm.     Pulses: Normal pulses.     Heart sounds: Normal heart sounds.  Pulmonary:     Effort: Pulmonary effort is normal.     Breath sounds: Normal breath sounds. No wheezing, rhonchi or rales.  Genitourinary:    Labia:        Right: No  rash, tenderness or lesion.        Left: No rash, tenderness or lesion.      Vagina: No foreign body. No vaginal discharge, erythema, tenderness or bleeding.     Adnexa:        Right: No mass, tenderness or fullness.         Left: No mass, tenderness or fullness.       Comments: vulvovaginal erythema present. Skin is very dry and no vaginal secretions are present.  No lesions.   Bimanual exam performed without fullness or tenderness bilateral adnexa. Skin:    General: Skin is warm and dry.  Neurological:     Mental Status: She is alert.  Psychiatric:        Speech: Speech normal.        Behavior: Behavior normal.        Thought Content: Thought content normal.

## 2023-03-21 LAB — URINE CULTURE
MICRO NUMBER:: 15514745
Result:: NO GROWTH
SPECIMEN QUALITY:: ADEQUATE

## 2023-03-21 LAB — CERVICOVAGINAL ANCILLARY ONLY
Bacterial Vaginitis (gardnerella): NEGATIVE
Candida Glabrata: NEGATIVE
Candida Vaginitis: NEGATIVE
Comment: NEGATIVE
Comment: NEGATIVE
Comment: NEGATIVE

## 2023-03-24 ENCOUNTER — Other Ambulatory Visit: Payer: Self-pay | Admitting: Family

## 2023-03-24 ENCOUNTER — Encounter: Payer: Self-pay | Admitting: Family

## 2023-03-24 DIAGNOSIS — N952 Postmenopausal atrophic vaginitis: Secondary | ICD-10-CM

## 2023-03-24 MED ORDER — ESTRADIOL 0.1 MG/GM VA CREA
TOPICAL_CREAM | VAGINAL | 0 refills | Status: DC
Start: 2023-03-24 — End: 2023-04-30

## 2023-03-25 NOTE — Progress Notes (Signed)
Noted. Pt already has appt. Updated note to collect urine as well

## 2023-03-26 ENCOUNTER — Ambulatory Visit: Payer: Medicare Other | Admitting: Internal Medicine

## 2023-03-26 VITALS — BP 132/70 | HR 89 | Temp 97.9°F | Resp 16 | Ht 62.0 in | Wt 124.4 lb

## 2023-03-26 DIAGNOSIS — Z0000 Encounter for general adult medical examination without abnormal findings: Secondary | ICD-10-CM | POA: Diagnosis not present

## 2023-03-26 DIAGNOSIS — Z23 Encounter for immunization: Secondary | ICD-10-CM

## 2023-03-26 DIAGNOSIS — I10 Essential (primary) hypertension: Secondary | ICD-10-CM

## 2023-03-26 DIAGNOSIS — N898 Other specified noninflammatory disorders of vagina: Secondary | ICD-10-CM | POA: Diagnosis not present

## 2023-03-26 DIAGNOSIS — G629 Polyneuropathy, unspecified: Secondary | ICD-10-CM

## 2023-03-26 DIAGNOSIS — R739 Hyperglycemia, unspecified: Secondary | ICD-10-CM | POA: Diagnosis not present

## 2023-03-26 DIAGNOSIS — D509 Iron deficiency anemia, unspecified: Secondary | ICD-10-CM | POA: Diagnosis not present

## 2023-03-26 DIAGNOSIS — R413 Other amnesia: Secondary | ICD-10-CM

## 2023-03-26 DIAGNOSIS — J4520 Mild intermittent asthma, uncomplicated: Secondary | ICD-10-CM

## 2023-03-26 DIAGNOSIS — E7800 Pure hypercholesterolemia, unspecified: Secondary | ICD-10-CM | POA: Diagnosis not present

## 2023-03-26 DIAGNOSIS — K219 Gastro-esophageal reflux disease without esophagitis: Secondary | ICD-10-CM

## 2023-03-26 DIAGNOSIS — F419 Anxiety disorder, unspecified: Secondary | ICD-10-CM

## 2023-03-26 DIAGNOSIS — N952 Postmenopausal atrophic vaginitis: Secondary | ICD-10-CM

## 2023-03-26 LAB — LIPID PANEL
Cholesterol: 189 mg/dL (ref 0–200)
HDL: 72.5 mg/dL (ref >39.00)
LDL Cholesterol: 94 mg/dL (ref 0–99)
NonHDL: 116.69
Total CHOL/HDL Ratio: 3
Triglycerides: 111 mg/dL (ref 0.0–149.0)
VLDL: 22.2 mg/dL (ref 0.0–40.0)

## 2023-03-26 LAB — BASIC METABOLIC PANEL
BUN: 16 mg/dL (ref 6–23)
CO2: 30 meq/L (ref 19–32)
Calcium: 9.4 mg/dL (ref 8.4–10.5)
Chloride: 104 meq/L (ref 96–112)
Creatinine, Ser: 0.96 mg/dL (ref 0.40–1.20)
GFR: 54.69 mL/min — ABNORMAL LOW (ref >60.00)
Glucose, Bld: 93 mg/dL (ref 70–99)
Potassium: 3.5 meq/L (ref 3.5–5.1)
Sodium: 141 meq/L (ref 135–145)

## 2023-03-26 LAB — URINALYSIS, ROUTINE W REFLEX MICROSCOPIC
Bilirubin Urine: NEGATIVE
Ketones, ur: NEGATIVE
Leukocytes,Ua: NEGATIVE
Nitrite: NEGATIVE
Specific Gravity, Urine: 1.015 (ref 1.000–1.030)
Urine Glucose: NEGATIVE
Urobilinogen, UA: 0.2 (ref 0.0–1.0)
pH: 7.5 (ref 5.0–8.0)

## 2023-03-26 LAB — CBC WITH DIFFERENTIAL/PLATELET
Basophils Absolute: 0 10*3/uL (ref 0.0–0.1)
Basophils Relative: 0.8 % (ref 0.0–3.0)
Eosinophils Absolute: 0.1 10*3/uL (ref 0.0–0.7)
Eosinophils Relative: 1.5 % (ref 0.0–5.0)
HCT: 36.6 % (ref 36.0–46.0)
Hemoglobin: 12 g/dL (ref 12.0–15.0)
Lymphocytes Relative: 37.6 % (ref 12.0–46.0)
Lymphs Abs: 2 10*3/uL (ref 0.7–4.0)
MCHC: 32.9 g/dL (ref 30.0–36.0)
MCV: 91.2 fL (ref 78.0–100.0)
Monocytes Absolute: 0.4 10*3/uL (ref 0.1–1.0)
Monocytes Relative: 6.5 % (ref 3.0–12.0)
Neutro Abs: 2.9 10*3/uL (ref 1.4–7.7)
Neutrophils Relative %: 53.6 % (ref 43.0–77.0)
Platelets: 194 10*3/uL (ref 150.0–400.0)
RBC: 4.01 Mil/uL (ref 3.87–5.11)
RDW: 14 % (ref 11.5–15.5)
WBC: 5.4 10*3/uL (ref 4.0–10.5)

## 2023-03-26 LAB — IBC + FERRITIN
Ferritin: 53.7 ng/mL (ref 10.0–291.0)
Iron: 90 ug/dL (ref 42–145)
Saturation Ratios: 30.9 % (ref 20.0–50.0)
TIBC: 291.2 ug/dL (ref 250.0–450.0)
Transferrin: 208 mg/dL — ABNORMAL LOW (ref 212.0–360.0)

## 2023-03-26 LAB — HEPATIC FUNCTION PANEL
ALT: 13 U/L (ref 0–35)
AST: 20 U/L (ref 0–37)
Albumin: 4.1 g/dL (ref 3.5–5.2)
Alkaline Phosphatase: 83 U/L (ref 39–117)
Bilirubin, Direct: 0.1 mg/dL (ref 0.0–0.3)
Total Bilirubin: 0.4 mg/dL (ref 0.2–1.2)
Total Protein: 6.9 g/dL (ref 6.0–8.3)

## 2023-03-26 LAB — VITAMIN B12: Vitamin B-12: 485 pg/mL (ref 211–911)

## 2023-03-26 MED ORDER — NYSTATIN 100000 UNIT/GM EX CREA: 1.0000 | TOPICAL_CREAM | Freq: Two times a day (BID) | CUTANEOUS | 0 refills | Status: DC

## 2023-03-26 NOTE — Progress Notes (Signed)
Subjective:    Patient ID: Patricia Pacheco, female    DOB: 06-28-1940, 82 y.o.   MRN: [id number]  Patient here for  Chief Complaint  Patient presents with   Annual Exam    HPI Here for a physical exam. Saw neurology - lower extremity pain/numbness/tingling. Started on gabapentin 300mg  q hs. Also recommended to start alpha lipoic acid. Also evaluated for memory concerns. Recommended MRI - neuroquant. Also recommended starting aricept. 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 Did not tolerate gabapentin. Prozac was stopped. Recommended starting cymbalta. Not on prozac or cymbalta now.  Has been having issues with urinary symptoms - dysuria, burning. First saw urology in 2023 for pyuria with negative urine cultures and underwent a negative renal ultrasound and cystoscopy. Reevaluated 01/23/23 (Dr Lonna Cobb) - recommended cranberry and D-mannose. Reevaluated - acute visit - treated with diflucan. Reevaluated 9/25 - urine culture and vaginal swab negative.  Recommended to start vaginal estrogen. She has not picked up this prescription yet.  Reports persistent burning.  Today, most of her burning - perivaginal area.  No nausea or vomiting.  No abdominal pain.  Breathing stable.  Discussed memory.  She feels is stable.  Desires no further intervention.    Past Medical History:  Diagnosis Date   Allergy    allergy shots (Juengel)   Anxiety    Arthritis    Asthma    CAD (coronary artery disease)    cath 11/27/04 - moderated LAD disease   Cancer (HCC)    skin   GERD (gastroesophageal reflux disease)    EGD (05/25/04) - slight presbyesophagus, mild esophagitis, mild gastritis, mild duodenitis - positive H. pylori.    Neuropathy    Past Surgical History:  Procedure Laterality Date   ABDOMINAL HYSTERECTOMY     APPENDECTOMY  1967   COLONOSCOPY WITH PROPOFOL N/A 11/16/2019   Procedure: COLONOSCOPY WITH PROPOFOL;  Surgeon: Toney Reil, MD;  Location: [facility] ENDOSCOPY;  Service:  Gastroenterology;  Laterality: N/A;   ESOPHAGOGASTRODUODENOSCOPY (EGD) WITH PROPOFOL N/A 11/16/2019   Procedure: ESOPHAGOGASTRODUODENOSCOPY (EGD) WITH PROPOFOL;  Surgeon: Toney Reil, MD;  Location: [facility] ENDOSCOPY;  Service: Gastroenterology;  Laterality: N/A;   Family History  Problem Relation Age of Onset   Arthritis Mother    Stroke Sister    Breast cancer Neg Hx    Social History   Socioeconomic History   Marital status: Married    Spouse name: Not on file   Number of children: Not on file   Years of education: Not on file   Highest education level: Not on file  Occupational History   Not on file  Tobacco Use   Smoking status: Never   Smokeless tobacco: Never  Vaping Use   Vaping status: Never Used  Substance and Sexual Activity   Alcohol use: No    Alcohol/week: 0.0 standard drinks of alcohol   Drug use: No   Sexual activity: Not on file  Other Topics Concern   Not on file  Social History Narrative   Not on file   Social Determinants of Health   Financial Resource Strain: Low Risk  (10/24/2021)   Overall Financial Resource Strain (CARDIA)    Difficulty of Paying Living Expenses: Not hard at all  Food Insecurity: No Food Insecurity (10/24/2021)   Hunger Vital Sign    Worried About Running Out of Food in the Last Year: Never true    Ran Out of Food in the Last Year: Never true  Transportation  Needs: No Transportation Needs (10/24/2021)   PRAPARE - Administrator, Civil Service (Medical): No    Lack of Transportation (Non-Medical): No  Physical Activity: Not on file  Stress: No Stress Concern Present (10/24/2021)   [employer] of Occupational Health - Occupational Stress Questionnaire    Feeling of Stress : Not at all  Social Connections: Socially Integrated (10/24/2021)   Social Connection and Isolation Panel [NHANES]    Frequency of Communication with Friends and Family: More than three times a week    Frequency of Social Gatherings with  Friends and Family: Twice a week    Attends Religious Services: More than 4 times per year    Active Member of [employer] or Organizations: Yes    Attends Banker Meetings: Not on file    Marital Status: Married     Review of Systems  Constitutional:  Negative for appetite change and unexpected weight change.  HENT:  Negative for congestion and sinus pressure.   Respiratory:  Negative for cough, chest tightness and shortness of breath.   Cardiovascular:  Negative for chest pain and palpitations.  Gastrointestinal:  Negative for abdominal pain, diarrhea, nausea and vomiting.  Genitourinary:  Negative for difficulty urinating.       Vaginal burning and irritation as outlined.   Musculoskeletal:  Negative for joint swelling and myalgias.  Skin:  Negative for color change and wound.  Neurological:  Negative for dizziness and headaches.  Psychiatric/Behavioral:  Negative for agitation and dysphoric mood.        Objective:     BP 132/70   Pulse 89   Temp 97.9 F (36.6 C)   Resp 16   Ht 5\' 2"  (1.575 m)   Wt 124 lb 6.4 oz (56.4 kg)   LMP 08/29/1995   SpO2 97%   BMI 22.75 kg/m  Wt Readings from Last 3 Encounters:  03/26/23 124 lb 6.4 oz (56.4 kg)  03/20/23 126 lb 4 oz (57.3 kg)  02/12/23 125 lb 9.6 oz (57 kg)    Physical Exam Vitals reviewed.  Constitutional:      General: She is not in acute distress.    Appearance: Normal appearance.  HENT:     Head: Normocephalic and atraumatic.     Right Ear: External ear normal.     Left Ear: External ear normal.  Eyes:     General: No scleral icterus.       Right eye: No discharge.        Left eye: No discharge.     Conjunctiva/sclera: Conjunctivae normal.  Neck:     Thyroid: No thyromegaly.  Cardiovascular:     Rate and Rhythm: Normal rate and regular rhythm.  Pulmonary:     Effort: No respiratory distress.     Breath sounds: Normal breath sounds. No wheezing.  Abdominal:     General: Bowel sounds are normal.      Palpations: Abdomen is soft.     Tenderness: There is no abdominal tenderness.  Genitourinary:    Comments: Normal external genitalia.  Some minimal erythema - vagina.Could not appreciate any adnexal masses or tenderness.   Musculoskeletal:        General: No swelling or tenderness.     Cervical back: Neck supple. No tenderness.  Lymphadenopathy:     Cervical: No cervical adenopathy.  Skin:    Findings: No erythema or rash.  Neurological:     Mental Status: She is alert.  Psychiatric:  Mood and Affect: Mood normal.        Behavior: Behavior normal.      Outpatient Encounter Medications as of 03/26/2023  Medication Sig   nystatin cream (MYCOSTATIN) Apply 1 Application topically 2 (two) times daily. Apply to outside of vaginal area.   Ascorbic Acid (VITAMIN C PO) Take by mouth daily.   aspirin 81 MG tablet Take 81 mg by mouth daily.   Calcium Carbonate-Vit D-Min (CALCIUM 1200 PO) Take by mouth daily.   celecoxib (CELEBREX) 200 MG capsule Take 1 capsule (200 mg total) by mouth daily.   estradiol (ESTRACE) 0.1 MG/GM vaginal cream Start 2g PV every day x 2 wk then taper dose gradually over 1-2 wk to maintenance dose usually 1-3 times per week.   Multiple Vitamin (MULTIVITAMIN) tablet Take 1 tablet by mouth daily.   Omega-3 Fatty Acids (FISH OIL PO) Take by mouth daily.   Probiotic Product (PROBIOTIC PO) Take by mouth.   terconazole (TERAZOL 3) 0.8 % vaginal cream Place 1 applicator vaginally at bedtime.   [DISCONTINUED] fexofenadine (ALLEGRA) 180 MG tablet Take 1 tablet (180 mg total) by mouth daily as needed for allergies or rhinitis. (Patient not taking: Reported on 02/12/2023)   [DISCONTINUED] fluconazole (DIFLUCAN) 150 MG tablet Take 1 tablet (150 mg total) by mouth daily.   [DISCONTINUED] FLUoxetine (PROZAC) 20 MG capsule TAKE 1 CAPSULE BY MOUTH ONCE DAILY (Patient not taking: Reported on 02/12/2023)   No facility-administered encounter medications on file as of 03/26/2023.      Lab Results  Component Value Date   WBC 5.4 03/26/2023   HGB 12.0 03/26/2023   HCT 36.6 03/26/2023   PLT 194.0 03/26/2023   GLUCOSE 93 03/26/2023   CHOL 189 03/26/2023   TRIG 111.0 03/26/2023   HDL 72.50 03/26/2023   LDLDIRECT 115.0 09/04/2012   LDLCALC 94 03/26/2023   ALT 13 03/26/2023   AST 20 03/26/2023   NA 141 03/26/2023   K 3.5 03/26/2023   CL 104 03/26/2023   CREATININE 0.96 03/26/2023   BUN 16 03/26/2023   CO2 30 03/26/2023   TSH 1.78 11/13/2022   HGBA1C 6.0 11/13/2022       Assessment & Plan:  Health care maintenance Assessment & Plan: Physical today 03/26/23.  Mammogram 10/26/20 - Birads 0.  F/u left breast mammogram 11/02/22 - birads I.  Overdue mammogram. Colonoscopy 10/2019 - f/u in 3 years.     Hyperglycemia Assessment & Plan: Low carb diet and exercise.  Follow met b and A1c.    Hypercholesterolemia Assessment & Plan: Have discussed calculated cholesterol risk.  She declines to start cholesterol medication.  Low cholesterol diet and exercise.  Follow lipid panel.    Orders: -     Hepatic function panel -     Lipid panel  Iron deficiency anemia, unspecified iron deficiency anemia type Assessment & Plan: S/p EGD and colonoscopy.  Follow cbc and iron studies.    Orders: -     CBC with Differential/Platelet -     IBC + Ferritin  Essential hypertension Assessment & Plan: Blood pressure as outlined.  Continues on hctz.  Follow pressures.  Follow metabolic panel.   Orders: -     Basic metabolic panel  Vaginal irritation Assessment & Plan: Symptoms as outlined.  Erythema - peri vaginal region. Nystatin cream as directed. Discussed estrogen cream for intravaginal symptoms.    Memory change Assessment & Plan: Evaluated for memory concerns. Recommended MRI - neuroquant. Also recommended starting aricept.   Orders: -  Vitamin B12  Vaginal atrophy -     Urinalysis, Routine w reflex microscopic  Need for influenza vaccination -     Flu  Vaccine Trivalent High Dose (Fluad)  Anxiety Assessment & Plan: Off prozac.  Does not feel needs any further intervention.  Follow.    Mild intermittent asthma, unspecified whether complicated Assessment & Plan: Breathing stable.    Gastroesophageal reflux disease, unspecified whether esophagitis present Assessment & Plan: No upper symptoms reported.  On prilosec.     Neuropathy Assessment & Plan: Recommended starting aricept. 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 Did not tolerate gabapentin. Prozac was stopped. Recommended starting cymbalta. Not on prozac or cymbalta now.    Other orders -     Nystatin; Apply 1 Application topically 2 (two) times daily. Apply to outside of vaginal area.  Dispense: 30 g; Refill: 0     Dale Amalga, MD

## 2023-03-26 NOTE — Assessment & Plan Note (Signed)
Physical today 03/26/23.  Mammogram 10/26/20 - Birads 0.  F/u left breast mammogram 11/02/22 - birads I.  Overdue mammogram. Colonoscopy 10/2019 - f/u in 3 years.

## 2023-03-27 ENCOUNTER — Ambulatory Visit: Payer: Medicare Other

## 2023-03-27 ENCOUNTER — Other Ambulatory Visit: Payer: Self-pay | Admitting: Internal Medicine

## 2023-03-27 DIAGNOSIS — R319 Hematuria, unspecified: Secondary | ICD-10-CM

## 2023-03-27 NOTE — Progress Notes (Signed)
Order placed for urine culture 

## 2023-03-28 LAB — URINE CULTURE
MICRO NUMBER:: 15542120
SPECIMEN QUALITY:: ADEQUATE

## 2023-03-30 ENCOUNTER — Encounter: Payer: Self-pay | Admitting: Internal Medicine

## 2023-03-30 NOTE — Assessment & Plan Note (Signed)
Low-carb diet and exercise.  Follow met b and A1c. ?

## 2023-03-30 NOTE — Assessment & Plan Note (Signed)
No upper symptoms reported. On prilosec.  

## 2023-03-30 NOTE — Assessment & Plan Note (Signed)
S/p EGD and colonoscopy.  Follow cbc and iron studies.  

## 2023-03-30 NOTE — Assessment & Plan Note (Signed)
Off prozac.  Does not feel needs any further intervention.  Follow.

## 2023-03-30 NOTE — Assessment & Plan Note (Signed)
Symptoms as outlined.  Erythema - peri vaginal region. Nystatin cream as directed. Discussed estrogen cream for intravaginal symptoms.

## 2023-03-30 NOTE — Assessment & Plan Note (Signed)
Recommended starting aricept. 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 Did not tolerate gabapentin. Prozac was stopped. Recommended starting cymbalta. Not on prozac or cymbalta now.

## 2023-03-30 NOTE — Assessment & Plan Note (Signed)
Breathing stable.

## 2023-03-30 NOTE — Assessment & Plan Note (Signed)
Evaluated for memory concerns. Recommended MRI - neuroquant. Also recommended starting aricept.

## 2023-03-30 NOTE — Assessment & Plan Note (Signed)
Have discussed calculated cholesterol risk.  She declines to start cholesterol medication.  Low cholesterol diet and exercise.  Follow lipid panel.   ?

## 2023-03-30 NOTE — Assessment & Plan Note (Signed)
Blood pressure as outlined.  Continues on hctz.  Follow pressures.  Follow metabolic panel.

## 2023-04-01 ENCOUNTER — Telehealth: Payer: Self-pay | Admitting: *Deleted

## 2023-04-01 DIAGNOSIS — Z859 Personal history of malignant neoplasm, unspecified: Secondary | ICD-10-CM | POA: Diagnosis not present

## 2023-04-01 DIAGNOSIS — Z872 Personal history of diseases of the skin and subcutaneous tissue: Secondary | ICD-10-CM | POA: Diagnosis not present

## 2023-04-01 DIAGNOSIS — L578 Other skin changes due to chronic exposure to nonionizing radiation: Secondary | ICD-10-CM | POA: Diagnosis not present

## 2023-04-01 DIAGNOSIS — Z85828 Personal history of other malignant neoplasm of skin: Secondary | ICD-10-CM | POA: Diagnosis not present

## 2023-04-01 DIAGNOSIS — R319 Hematuria, unspecified: Secondary | ICD-10-CM

## 2023-04-01 DIAGNOSIS — R300 Dysuria: Secondary | ICD-10-CM

## 2023-04-01 DIAGNOSIS — L570 Actinic keratosis: Secondary | ICD-10-CM | POA: Diagnosis not present

## 2023-04-01 NOTE — Telephone Encounter (Signed)
Order placed for urology referral.  

## 2023-04-01 NOTE — Telephone Encounter (Signed)
Left voicemail to return call for results. See below & please let us know if pt is agreeable to Urology referral.

## 2023-04-01 NOTE — Telephone Encounter (Signed)
-----   Message from [place] sent at 03/29/2023 12:34 PM EDT ----- Notify - kidney function is stable. Hgb is normal.  Triglycerides improved.  B12 and liver function tests - wnl. Urine culture is negative for infection.  Urinalysis reveals red blood cells present. Continue the estrogen cream and I would also like to refer her to urology - given persistent symptoms and red blood cells in urine.

## 2023-04-01 NOTE — Telephone Encounter (Signed)
Pt son called back who was not on the DPR but the pt was with him and I read the results to her. Pt stated she would like the urology referral

## 2023-04-22 NOTE — Progress Notes (Deleted)
04/25/2023 9:51 AM   Patricia Pacheco 1940/10/26 [id number]  Referring provider: Dale Middlesex, MD [address] [place],  [place] [address]  Urological history: 1. OAB -Contributing factors of age, GSM, hypertension, asthma and anxiety -RUS (09/2021) - NED -cysto (09/2021) - ned -Vaginal estrogen cream  No chief complaint on file.  HPI: Patricia Pacheco is a 82 y.o. Female who presents today for persistent micro heme.   Previous records reviewed.   She continues to have persistent micro heme on her ua's.    UA ***  PVR ***    PMH: Past Medical History:  Diagnosis Date   Allergy    allergy shots (Juengel)   Anxiety    Arthritis    Asthma    CAD (coronary artery disease)    cath 11/27/04 - moderated LAD disease   Cancer (HCC)    skin   GERD (gastroesophageal reflux disease)    EGD (05/25/04) - slight presbyesophagus, mild esophagitis, mild gastritis, mild duodenitis - positive H. pylori.    Neuropathy     Surgical History: Past Surgical History:  Procedure Laterality Date   ABDOMINAL HYSTERECTOMY     APPENDECTOMY  1967   COLONOSCOPY WITH PROPOFOL N/A 11/16/2019   Procedure: COLONOSCOPY WITH PROPOFOL;  Surgeon: Toney Reil, MD;  Location: [facility] ENDOSCOPY;  Service: Gastroenterology;  Laterality: N/A;   ESOPHAGOGASTRODUODENOSCOPY (EGD) WITH PROPOFOL N/A 11/16/2019   Procedure: ESOPHAGOGASTRODUODENOSCOPY (EGD) WITH PROPOFOL;  Surgeon: Toney Reil, MD;  Location: [facility] ENDOSCOPY;  Service: Gastroenterology;  Laterality: N/A;    Home Medications:  Allergies as of 04/25/2023       Reactions   Mushroom Extract Complex Hives   All Mushrooms         Medication List        Accurate as of April 22, 2023  9:51 AM. If you have any questions, ask your nurse or doctor.          aspirin 81 MG tablet Take 81 mg by mouth daily.   CALCIUM 1200 PO Take by mouth daily.   celecoxib 200 MG capsule Commonly known as:  CeleBREX Take 1 capsule (200 mg total) by mouth daily.   estradiol 0.1 MG/GM vaginal cream Commonly known as: ESTRACE Start 2g PV every day x 2 wk then taper dose gradually over 1-2 wk to maintenance dose usually 1-3 times per week.   FISH OIL PO Take by mouth daily.   multivitamin tablet Take 1 tablet by mouth daily.   nystatin cream Commonly known as: MYCOSTATIN Apply 1 Application topically 2 (two) times daily. Apply to outside of vaginal area.   PROBIOTIC PO Take by mouth.   terconazole 0.8 % vaginal cream Commonly known as: TERAZOL 3 Place 1 applicator vaginally at bedtime.   VITAMIN C PO Take by mouth daily.        Allergies:  Allergies  Allergen Reactions   Mushroom Extract Complex Hives    All Mushrooms     Family History: Family History  Problem Relation Age of Onset   Arthritis Mother    Stroke Sister    Breast cancer Neg Hx     Social History:  reports that she has never smoked. She has never used smokeless tobacco. She reports that she does not drink alcohol and does not use drugs.  ROS: Pertinent ROS in HPI  Physical Exam: LMP 08/29/1995   Constitutional:  Well nourished. Alert and oriented, No acute distress. HEENT: [place] AT, moist mucus  membranes.  Trachea midline, no masses. Cardiovascular: No clubbing, cyanosis, or edema. Respiratory: Normal respiratory effort, no increased work of breathing. GU: No CVA tenderness.  No bladder fullness or masses.  Recession of labia minora, dry, pale vulvar vaginal mucosa and loss of mucosal ridges and folds.  Normal urethral meatus, no lesions, no prolapse, no discharge.   No urethral masses, tenderness and/or tenderness. No bladder fullness, tenderness or masses. *** vagina mucosa, *** estrogen effect, no discharge, no lesions, *** pelvic support, *** cystocele and *** rectocele noted.  No cervical motion tenderness.  Uterus is freely mobile and non-fixed.  No adnexal/parametria masses or tenderness noted.   Anus and perineum are without rashes or lesions.   ***  Neurologic: Grossly intact, no focal deficits, moving all 4 extremities. Psychiatric: Normal mood and affect.    Laboratory Data: Lab Results  Component Value Date   WBC 5.4 03/26/2023   HGB 12.0 03/26/2023   HCT 36.6 03/26/2023   MCV 91.2 03/26/2023   PLT 194.0 03/26/2023    Lab Results  Component Value Date   CREATININE 0.96 03/26/2023    Lab Results  Component Value Date   HGBA1C 6.0 11/13/2022    Lab Results  Component Value Date   TSH 1.78 11/13/2022       Component Value Date/Time   CHOL 189 03/26/2023 1133   HDL 72.50 03/26/2023 1133   CHOLHDL 3 03/26/2023 1133   VLDL 22.2 03/26/2023 1133   LDLCALC 94 03/26/2023 1133    Lab Results  Component Value Date   AST 20 03/26/2023   Lab Results  Component Value Date   ALT 13 03/26/2023    Urinalysis    Component Value Date/Time   COLORURINE YELLOW 03/26/2023 1134   APPEARANCEUR Sl Cloudy (A) 03/26/2023 1134   APPEARANCEUR Clear 01/23/2023 1451   LABSPEC 1.015 03/26/2023 1134   PHURINE 7.5 03/26/2023 1134   GLUCOSEU NEGATIVE 03/26/2023 1134   HGBUR SMALL (A) 03/26/2023 1134   BILIRUBINUR NEGATIVE 03/26/2023 1134   BILIRUBINUR neg 03/20/2023 1252   BILIRUBINUR Negative 01/23/2023 1451   KETONESUR NEGATIVE 03/26/2023 1134   PROTEINUR Negative 03/20/2023 1252   PROTEINUR Negative 01/23/2023 1451   PROTEINUR TRACE (A) 08/24/2021 1146   UROBILINOGEN 0.2 03/26/2023 1134   NITRITE NEGATIVE 03/26/2023 1134   LEUKOCYTESUR NEGATIVE 03/26/2023 1134    I have reviewed the labs.   Pertinent Imaging: ***   Assessment & Plan:  ***  1. High risk hematuria -non- smoker -work up 2023 - NED -no reports of gross heme -UA *** -could consider a CT urogram ***  2. GSM -encouraged vaginal estrogen cream use   3. OAB -PVR demonstrated adequate emptying  No follow-ups on file.  These notes generated with voice recognition software. I apologize for  typographical errors.  [name]  [facility] [address]  [address] [place], [place] [address] [phone]

## 2023-04-25 ENCOUNTER — Ambulatory Visit: Payer: Medicare Other | Admitting: Urology

## 2023-04-25 DIAGNOSIS — R319 Hematuria, unspecified: Secondary | ICD-10-CM

## 2023-04-25 DIAGNOSIS — N3281 Overactive bladder: Secondary | ICD-10-CM

## 2023-04-25 DIAGNOSIS — N952 Postmenopausal atrophic vaginitis: Secondary | ICD-10-CM

## 2023-04-30 ENCOUNTER — Encounter: Payer: Self-pay | Admitting: Urology

## 2023-04-30 ENCOUNTER — Ambulatory Visit: Payer: Medicare Other | Admitting: Urology

## 2023-04-30 ENCOUNTER — Encounter: Payer: Self-pay | Admitting: Internal Medicine

## 2023-04-30 VITALS — BP 150/82 | HR 96 | Ht 62.0 in | Wt 125.0 lb

## 2023-04-30 DIAGNOSIS — N952 Postmenopausal atrophic vaginitis: Secondary | ICD-10-CM

## 2023-04-30 DIAGNOSIS — N3281 Overactive bladder: Secondary | ICD-10-CM

## 2023-04-30 DIAGNOSIS — R319 Hematuria, unspecified: Secondary | ICD-10-CM | POA: Insufficient documentation

## 2023-04-30 LAB — URINALYSIS, COMPLETE
Bilirubin, UA: NEGATIVE
Glucose, UA: NEGATIVE
Nitrite, UA: NEGATIVE
Specific Gravity, UA: >1.03 — ABNORMAL HIGH (ref 1.005–1.030)
Urobilinogen, Ur: 0.2 mg/dL (ref 0.2–1.0)
pH, UA: 5.5 (ref 5.0–7.5)

## 2023-04-30 LAB — MICROSCOPIC EXAMINATION: WBC, UA: >30 /[HPF] — AB (ref 0–5)

## 2023-04-30 LAB — BLADDER SCAN AMB NON-IMAGING: Scan Result: 0

## 2023-04-30 MED ORDER — ESTRADIOL 0.1 MG/GM VA CREA: TOPICAL_CREAM | VAGINAL | 0 refills | Status: AC

## 2023-04-30 NOTE — Patient Instructions (Signed)
Your dermatologist is located at [facility] dermatology and their phone number is [phone].  Please call them to schedule appointment to look at the mole and rash on your hands.  I also schedule you for a CAT scan of your kidneys and this will be with contrast.  They will inject contrast into your veins and this contrast is taken up by the kidneys.  We are doing this to see if there is any anatomical abnormalities in your kidneys that are contributing to the consistent microscopic blood in your urine.  The CT scheduler will contact you to schedule your test.  You will then return to the office at your scheduled appointment time to go over the results

## 2023-05-01 ENCOUNTER — Telehealth: Payer: Self-pay

## 2023-05-01 NOTE — Telephone Encounter (Signed)
[facility] with [facility] and advised results

## 2023-05-01 NOTE — Telephone Encounter (Signed)
Pharmacy called triage line asking about estradiol vaginal cream, since this is the pt second rx sent in, they wanted to clarify on how the pt is suppose to be using it.  Below are the directions of the current rx,  Sig: Start 2g PV every day x 2 wk then taper dose gradually over 1-2 wk to maintenance dose usually 1-3 times per week.   Or should the pt be on the 1-3 per week regimen now?

## 2023-05-02 ENCOUNTER — Ambulatory Visit (INDEPENDENT_AMBULATORY_CARE_PROVIDER_SITE_OTHER): Payer: Medicare Other | Admitting: Internal Medicine

## 2023-05-02 ENCOUNTER — Other Ambulatory Visit (HOSPITAL_COMMUNITY)
Admission: RE | Admit: 2023-05-02 | Discharge: 2023-05-02 | Disposition: A | Discharge status: Home / Self Care | Payer: Medicare Other | Source: Ambulatory Visit | Attending: Internal Medicine | Admitting: Internal Medicine

## 2023-05-02 ENCOUNTER — Encounter: Payer: Self-pay | Admitting: Internal Medicine

## 2023-05-02 ENCOUNTER — Telehealth: Payer: Self-pay | Admitting: Internal Medicine

## 2023-05-02 VITALS — BP 128/72 | HR 78 | Temp 98.0°F | Resp 16 | Ht 62.0 in | Wt 127.0 lb

## 2023-05-02 DIAGNOSIS — R300 Dysuria: Secondary | ICD-10-CM | POA: Diagnosis not present

## 2023-05-02 DIAGNOSIS — N898 Other specified noninflammatory disorders of vagina: Secondary | ICD-10-CM

## 2023-05-02 DIAGNOSIS — I10 Essential (primary) hypertension: Secondary | ICD-10-CM | POA: Diagnosis not present

## 2023-05-02 DIAGNOSIS — N760 Acute vaginitis: Secondary | ICD-10-CM

## 2023-05-02 DIAGNOSIS — N3000 Acute cystitis without hematuria: Secondary | ICD-10-CM

## 2023-05-02 DIAGNOSIS — R739 Hyperglycemia, unspecified: Secondary | ICD-10-CM

## 2023-05-02 DIAGNOSIS — R413 Other amnesia: Secondary | ICD-10-CM

## 2023-05-02 DIAGNOSIS — E7800 Pure hypercholesterolemia, unspecified: Secondary | ICD-10-CM

## 2023-05-02 DIAGNOSIS — D509 Iron deficiency anemia, unspecified: Secondary | ICD-10-CM

## 2023-05-02 LAB — POCT URINALYSIS DIPSTICK
Bilirubin, UA: NEGATIVE
Glucose, UA: NEGATIVE
Nitrite, UA: NEGATIVE
Protein, UA: POSITIVE — AB
Spec Grav, UA: 1.025 (ref 1.010–1.025)
Urobilinogen, UA: 1 U/dL
pH, UA: 6 (ref 5.0–8.0)

## 2023-05-02 LAB — URINALYSIS, MICROSCOPIC ONLY

## 2023-05-02 MED ORDER — NYSTATIN 100000 UNIT/GM EX CREA: 1.0000 | TOPICAL_CREAM | Freq: Two times a day (BID) | CUTANEOUS | 0 refills | Status: DC

## 2023-05-02 NOTE — Telephone Encounter (Signed)
Spoke with son and offered appt for today at 1:30 with Dr Lorin Picket. He is supposed to call back and let me know if he can have her here.

## 2023-05-02 NOTE — Progress Notes (Unsigned)
Discussed the use of AI scribe software for clinical note transcription with the patient, who gave verbal consent to proceed.  T  Subjective:    Patient ID: Patricia Pacheco, female    DOB: [birth year], 82 y.o.   MRN: [id number]  Patient here for  Chief Complaint  Patient presents with   Urinary Tract Infection    HPI  The patient presents with persistent vaginal burning, both internally and externally, and frequent urination. She describes the sensation as similar to a urinary tract infection, but more localized to the vaginal tissue. The patient reports no visible blood in the urine. She has been seen by urology for similar symptoms in the past and was prescribed an estrogen cream. Appears to not be using. They are also working her up for hematuria. On further questioning, she describes burning both inside and outside the vagina.   The patient also reports memory issues, which she describes as worsening. She has difficulty recalling recent events and appointments.  Additionally, the patient has developed a rash between her fingers over the past week. The rash does not itch but has a slight burning sensation. She has been applying a non-perfumed cream, but it has not improved the condition. The patient washes her hands frequently and the rash may be related to this frequent washing and potential moisture retention.  The patient's symptoms have been ongoing for a while, and she has been on intermittent antibiotics for bladder issues. She reports that the burning sensation is currently very intense and feels like an infection. Despite these symptoms, the patient reports no nausea, vomiting, or diarrhea, and she is eating and staying hydrated.    Past Medical History:  Diagnosis Date   Allergy    allergy shots (Juengel)   Anxiety    Arthritis    Asthma    CAD (coronary artery disease)    cath 11/27/04 - moderated LAD disease   Cancer (HCC)    skin   GERD (gastroesophageal reflux disease)     EGD (05/25/04) - slight presbyesophagus, mild esophagitis, mild gastritis, mild duodenitis - positive H. pylori.    Neuropathy    Past Surgical History:  Procedure Laterality Date   ABDOMINAL HYSTERECTOMY     APPENDECTOMY  1967   COLONOSCOPY WITH PROPOFOL N/A 11/16/2019   Procedure: COLONOSCOPY WITH PROPOFOL;  Surgeon: Toney Reil, MD;  Location: [facility] ENDOSCOPY;  Service: Gastroenterology;  Laterality: N/A;   ESOPHAGOGASTRODUODENOSCOPY (EGD) WITH PROPOFOL N/A 11/16/2019   Procedure: ESOPHAGOGASTRODUODENOSCOPY (EGD) WITH PROPOFOL;  Surgeon: Toney Reil, MD;  Location: [facility] ENDOSCOPY;  Service: Gastroenterology;  Laterality: N/A;   Family History  Problem Relation Age of Onset   Arthritis Mother    Stroke Sister    Breast cancer Neg Hx    Social History   Socioeconomic History   Marital status: Married    Spouse name: Not on file   Number of children: Not on file   Years of education: Not on file   Highest education level: Not on file  Occupational History   Not on file  Tobacco Use   Smoking status: Never   Smokeless tobacco: Never  Vaping Use   Vaping status: Never Used  Substance and Sexual Activity   Alcohol use: No    Alcohol/week: 0.0 standard drinks of alcohol   Drug use: No   Sexual activity: Not on file  Other Topics Concern   Not on file  Social History Narrative   Not on file   Social  Determinants of Health   Financial Resource Strain: Low Risk  (10/24/2021)   Overall Financial Resource Strain (CARDIA)    Difficulty of Paying Living Expenses: Not hard at all  Food Insecurity: No Food Insecurity (10/24/2021)   Hunger Vital Sign    Worried About Running Out of Food in the Last Year: Never true    Ran Out of Food in the Last Year: Never true  Transportation Needs: No Transportation Needs (10/24/2021)   PRAPARE - Administrator, Civil Service (Medical): No    Lack of Transportation (Non-Medical): No  Physical Activity: Not on file   Stress: No Stress Concern Present (10/24/2021)   [employer] of Occupational Health - Occupational Stress Questionnaire    Feeling of Stress : Not at all  Social Connections: Socially Integrated (10/24/2021)   Social Connection and Isolation Panel [NHANES]    Frequency of Communication with Friends and Family: More than three times a week    Frequency of Social Gatherings with Friends and Family: Twice a week    Attends Religious Services: More than 4 times per year    Active Member of [employer] or Organizations: Yes    Attends Engineer, structural: Not on file    Marital Status: Married     Review of Systems     Objective:     BP 128/72   Pulse 78   Temp 98 F (36.7 C)   Resp 16   Ht 5\' 2"  (1.575 m)   Wt 127 lb (57.6 kg)   LMP 08/29/1995   SpO2 98%   BMI 23.23 kg/m  Wt Readings from Last 3 Encounters:  05/02/23 127 lb (57.6 kg)  04/30/23 125 lb (56.7 kg)  03/26/23 124 lb 6.4 oz (56.4 kg)    Physical Exam   Outpatient Encounter Medications as of 05/02/2023  Medication Sig   Ascorbic Acid (VITAMIN C PO) Take by mouth daily.   aspirin 81 MG tablet Take 81 mg by mouth daily.   Calcium Carbonate-Vit D-Min (CALCIUM 1200 PO) Take by mouth daily.   celecoxib (CELEBREX) 200 MG capsule Take 1 capsule (200 mg total) by mouth daily.   estradiol (ESTRACE) 0.1 MG/GM vaginal cream Start 2g PV every day x 2 wk then taper dose gradually over 1-2 wk to maintenance dose usually 1-3 times per week.   Multiple Vitamin (MULTIVITAMIN) tablet Take 1 tablet by mouth daily.   nystatin cream (MYCOSTATIN) Apply 1 Application topically 2 (two) times daily. Apply to outside of vaginal area.   Omega-3 Fatty Acids (FISH OIL PO) Take by mouth daily.   Probiotic Product (PROBIOTIC PO) Take by mouth.   terconazole (TERAZOL 3) 0.8 % vaginal cream Place 1 applicator vaginally at bedtime.   [DISCONTINUED] nystatin cream (MYCOSTATIN) Apply 1 Application topically 2 (two) times daily. Apply to  outside of vaginal area.   No facility-administered encounter medications on file as of 05/02/2023.     Lab Results  Component Value Date   WBC 5.4 03/26/2023   HGB 12.0 03/26/2023   HCT 36.6 03/26/2023   PLT 194.0 03/26/2023   GLUCOSE 93 03/26/2023   CHOL 189 03/26/2023   TRIG 111.0 03/26/2023   HDL 72.50 03/26/2023   LDLDIRECT 115.0 09/04/2012   LDLCALC 94 03/26/2023   ALT 13 03/26/2023   AST 20 03/26/2023   NA 141 03/26/2023   K 3.5 03/26/2023   CL 104 03/26/2023   CREATININE 0.96 03/26/2023   BUN 16 03/26/2023   CO2 30  03/26/2023   TSH 1.78 11/13/2022   HGBA1C 6.0 11/13/2022    No results found.     Assessment & Plan:  Dysuria -     POCT urinalysis dipstick -     Urine Microscopic -     Urine Culture  Acute vaginitis -     Cervicovaginal ancillary only  Other orders -     Nystatin; Apply 1 Application topically 2 (two) times daily. Apply to outside of vaginal area.  Dispense: 30 g; Refill: 0  Assessment and Plan    Vaginal Burning and Frequent Urination   She reports a burning sensation in the vaginal area and frequent urination without blood in the urine. Has seen urology.  Prescribed estrogen cream. Appears to not be using.  Pelvic exam today.  KOH/wet prep performed.  Some perivaginal erythema.  Treat with nystatin cream.  Await results of KOH/wet prep.  Instructed to use the vaginal cream.   Rash on Fingers   She reports a rash between the fingers, accompanied by a burning sensation but no itching, despite applying otc cream without improvement. Trial of lotrimin. Keep the area between fingers dry.   Memory Issues   She reports significant memory issues.f/u with Dr Sherryll Burger.       Dale Briaroaks, MD

## 2023-05-02 NOTE — Telephone Encounter (Signed)
Patient just called and said she might have a UTI. She said it burns when she use the bathroom, and its not going away. She would like to see if she can get some medication for it. The pharmacy she use it [facility] - [place], [place] - [address] [place], [place] [address] Phone: [phone]  Fax: [phone]  Her number is [phone].

## 2023-05-02 NOTE — Patient Instructions (Signed)
Lotrimin cream - apply to affected area between fingers.  (This is over the counter)

## 2023-05-03 ENCOUNTER — Other Ambulatory Visit: Payer: Self-pay | Admitting: Urology

## 2023-05-03 DIAGNOSIS — R3129 Other microscopic hematuria: Secondary | ICD-10-CM

## 2023-05-03 LAB — CULTURE, URINE COMPREHENSIVE

## 2023-05-03 MED ORDER — NITROFURANTOIN MONOHYD MACRO 100 MG PO CAPS: 100.0000 mg | ORAL_CAPSULE | Freq: Two times a day (BID) | ORAL | 0 refills | Status: DC

## 2023-05-04 ENCOUNTER — Encounter: Payer: Self-pay | Admitting: Internal Medicine

## 2023-05-04 NOTE — Assessment & Plan Note (Addendum)
Seeing neurology. Evaluated for memory concerns. Recommended MRI - neuroquant. Also recommended starting aricept. Need to confirm f/u.

## 2023-05-04 NOTE — Assessment & Plan Note (Signed)
Blood pressure as outlined.  Continues on hctz.  Follow pressures.  Follow metabolic panel.

## 2023-05-04 NOTE — Assessment & Plan Note (Signed)
Symptoms as outlined.  Erythema - peri vaginal region. Nystatin cream as directed. Discussed estrogen cream for intravaginal symptoms. Prescribed by urology.

## 2023-05-04 NOTE — Assessment & Plan Note (Signed)
Low-carb diet and exercise.  Follow met b and A1c. ?

## 2023-05-04 NOTE — Assessment & Plan Note (Signed)
Urine sent to confirm if infection present. Seeing urology.

## 2023-05-04 NOTE — Assessment & Plan Note (Signed)
Have discussed calculated cholesterol risk.  She has declined to start cholesterol medication.  Low cholesterol diet and exercise.  Follow lipid panel.

## 2023-05-04 NOTE — Assessment & Plan Note (Signed)
S/p EGD and colonoscopy.  Follow cbc and iron studies.  

## 2023-05-05 LAB — URINE CULTURE
MICRO NUMBER:: 15700225
SPECIMEN QUALITY:: ADEQUATE

## 2023-05-06 ENCOUNTER — Telehealth: Payer: Self-pay

## 2023-05-06 LAB — CERVICOVAGINAL ANCILLARY ONLY
Bacterial Vaginitis (gardnerella): NEGATIVE
Candida Glabrata: NEGATIVE
Candida Vaginitis: NEGATIVE
Comment: NEGATIVE
Comment: NEGATIVE
Comment: NEGATIVE

## 2023-05-06 NOTE — Telephone Encounter (Signed)
-----   Message from [place] sent at 05/06/2023  4:13 AM EST ----- Please notify Patricia Pacheco that her urine culture was positive for infection.  Confirm no abx allergies. If no, then would like to treat with omnicef 300mg  bid x 5 days.

## 2023-05-06 NOTE — Telephone Encounter (Signed)
Lvm for pt to give office a call in regards to labs, see message below

## 2023-05-21 ENCOUNTER — Encounter: Payer: Self-pay | Admitting: Family

## 2023-05-21 ENCOUNTER — Ambulatory Visit (INDEPENDENT_AMBULATORY_CARE_PROVIDER_SITE_OTHER): Payer: Medicare Other | Admitting: *Deleted

## 2023-05-21 ENCOUNTER — Ambulatory Visit (INDEPENDENT_AMBULATORY_CARE_PROVIDER_SITE_OTHER): Payer: Medicare Other | Admitting: Family

## 2023-05-21 VITALS — BP 128/80 | HR 84 | Temp 97.9°F | Ht 62.0 in | Wt 124.8 lb

## 2023-05-21 VITALS — Ht 62.0 in | Wt 128.0 lb

## 2023-05-21 DIAGNOSIS — Z0000 Encounter for general adult medical examination without abnormal findings: Secondary | ICD-10-CM | POA: Diagnosis not present

## 2023-05-21 DIAGNOSIS — N898 Other specified noninflammatory disorders of vagina: Secondary | ICD-10-CM

## 2023-05-21 NOTE — Progress Notes (Signed)
Subjective:   Patricia Pacheco is a 82 y.o. female who presents for Medicare Annual (Subsequent) preventive examination.  Visit Complete: Virtual I connected with  Patricia Pacheco on 05/21/23 by a audio enabled telemedicine application and verified that I am speaking with the correct person using two identifiers.  Patient Location: Home  Provider Location: Office/Clinic  I discussed the limitations of evaluation and management by telemedicine. The patient expressed understanding and agreed to proceed.  Vital Signs: Because this visit was a virtual/telehealth visit, some criteria may be missing or patient reported. Any vitals not documented were not able to be obtained and vitals that have been documented are patient reported.   Cardiac Risk Factors include: advanced age (>70men, >45 women);dyslipidemia;hypertension     Objective:    Today's Vitals   05/21/23 1041  Weight: 128 lb (58.1 kg)  Height: 5\' 2"  (1.575 m)   Body mass index is 23.41 kg/m.     05/21/2023   10:55 AM 10/24/2021    3:53 PM 01/12/2020   12:37 PM 11/16/2019    9:09 AM 12/16/2017    5:14 PM  Advanced Directives  Does Patient Have a Medical Advance Directive? No Yes Yes Yes Yes  Type of Special educational needs teacher of [place];Living will Healthcare Power of [place];Living will  Healthcare Power of [place];Living will  Does patient want to make changes to medical advance directive?  No - Patient declined No - Patient declined  No - Patient declined  Copy of Healthcare Power of Attorney in Chart?  No - copy requested No - copy requested  No - copy requested  Would patient like information on creating a medical advance directive? No - Patient declined        Current Medications (verified) Outpatient Encounter Medications as of 05/21/2023  Medication Sig   Ascorbic Acid (VITAMIN C PO) Take by mouth daily.   aspirin 81 MG tablet Take 81 mg by mouth daily.   Calcium Carbonate-Vit D-Min (CALCIUM 1200 PO)  Take by mouth daily.   celecoxib (CELEBREX) 200 MG capsule Take 1 capsule (200 mg total) by mouth daily.   estradiol (ESTRACE) 0.1 MG/GM vaginal cream Start 2g PV every day x 2 wk then taper dose gradually over 1-2 wk to maintenance dose usually 1-3 times per week.   Multiple Vitamin (MULTIVITAMIN) tablet Take 1 tablet by mouth daily.   nystatin cream (MYCOSTATIN) Apply 1 Application topically 2 (two) times daily. Apply to outside of vaginal area.   Omega-3 Fatty Acids (FISH OIL PO) Take by mouth daily.   Probiotic Product (PROBIOTIC PO) Take by mouth.   terconazole (TERAZOL 3) 0.8 % vaginal cream Place 1 applicator vaginally at bedtime.   nitrofurantoin, macrocrystal-monohydrate, (MACROBID) 100 MG capsule Take 1 capsule (100 mg total) by mouth every 12 (twelve) hours. (Patient not taking: Reported on 05/21/2023)   No facility-administered encounter medications on file as of 05/21/2023.    Allergies (verified) Mushroom extract complex   History: Past Medical History:  Diagnosis Date   Allergy    allergy shots ([place])   Anxiety    Arthritis    Asthma    CAD (coronary artery disease)    cath 11/27/04 - moderated LAD disease   Cancer (HCC)    skin   GERD (gastroesophageal reflux disease)    EGD (05/25/04) - slight presbyesophagus, mild esophagitis, mild gastritis, mild duodenitis - positive H. pylori.    Neuropathy    Past Surgical History:  Procedure Laterality Date  ABDOMINAL HYSTERECTOMY     APPENDECTOMY  1967   COLONOSCOPY WITH PROPOFOL N/A 11/16/2019   Procedure: COLONOSCOPY WITH PROPOFOL;  Surgeon: Toney Reil, MD;  Location: [facility] ENDOSCOPY;  Service: Gastroenterology;  Laterality: N/A;   ESOPHAGOGASTRODUODENOSCOPY (EGD) WITH PROPOFOL N/A 11/16/2019   Procedure: ESOPHAGOGASTRODUODENOSCOPY (EGD) WITH PROPOFOL;  Surgeon: Toney Reil, MD;  Location: [facility] ENDOSCOPY;  Service: Gastroenterology;  Laterality: N/A;   Family History  Problem Relation Age of Onset    Arthritis Mother    Stroke Sister    Breast cancer Neg Hx    Social History   Socioeconomic History   Marital status: Married    Spouse name: Not on file   Number of children: Not on file   Years of education: Not on file   Highest education level: Not on file  Occupational History   Not on file  Tobacco Use   Smoking status: Never   Smokeless tobacco: Never  Vaping Use   Vaping status: Never Used  Substance and Sexual Activity   Alcohol use: No    Alcohol/week: 0.0 standard drinks of alcohol   Drug use: No   Sexual activity: Not on file  Other Topics Concern   Not on file  Social History Narrative   Not on file   Social Determinants of Health   Financial Resource Strain: Low Risk  (05/21/2023)   Overall Financial Resource Strain (CARDIA)    Difficulty of Paying Living Expenses: Not hard at all  Food Insecurity: No Food Insecurity (05/21/2023)   Hunger Vital Sign    Worried About Running Out of Food in the Last Year: Never true    Ran Out of Food in the Last Year: Never true  Transportation Needs: No Transportation Needs (05/21/2023)   PRAPARE - Administrator, Civil Service (Medical): No    Lack of Transportation (Non-Medical): No  Physical Activity: Inactive (05/21/2023)   Exercise Vital Sign    Days of Exercise per Week: 0 days    Minutes of Exercise per Session: 0 min  Stress: No Stress Concern Present (05/21/2023)   [employer] of Occupational Health - Occupational Stress Questionnaire    Feeling of Stress : Not at all  Social Connections: Moderately Isolated (05/21/2023)   Social Connection and Isolation Panel [NHANES]    Frequency of Communication with Friends and Family: More than three times a week    Frequency of Social Gatherings with Friends and Family: Twice a week    Attends Religious Services: Never    Database administrator or Organizations: No    Attends Engineer, structural: Never    Marital Status: Married     Tobacco Counseling Counseling given: Not Answered   Clinical Intake:  Pre-visit preparation completed: Yes  Pain : No/denies pain     BMI - recorded: 23.41 Nutritional Status: BMI of 19-24  Normal Nutritional Risks: None Diabetes: No  How often do you need to have someone help you when you read instructions, pamphlets, or other written materials from your doctor or pharmacy?: 1 - Never  Interpreter Needed?: No  Information entered by :: R. Tanesia Butner LPN   Activities of Daily Living    05/21/2023   10:43 AM  In your present state of health, do you have any difficulty performing the following activities:  Hearing? 1  Comment wears aids  Vision? 0  Comment glasses  Difficulty concentrating or making decisions? 1  Comment making decisions  Walking or climbing stairs?  0  Dressing or bathing? 0  Doing errands, shopping? 1  Comment family helps  Preparing Food and eating ? N  Using the Toilet? N  In the past six months, have you accidently leaked urine? N  Do you have problems with loss of bowel control? N  Managing your Medications? N  Managing your Finances? Y  Comment son Database administrator or managing your Housekeeping? N    Patient Care Team: Dale Muskego, MD as PCP - General (Internal Medicine) Dale Cardwell, MD (Internal Medicine) Lafe Garin, MD as Referring Physician (Obstetrics and Gynecology)  Indicate any recent Medical Services you may have received from other than [facility] providers in the past year (date may be approximate).     Assessment:   This is a routine wellness examination for Brayleigh.  Hearing/Vision screen Hearing Screening - Comments:: Wears aids Vision Screening - Comments:: glasses   Goals Addressed             This Visit's Progress    Patient Stated       Wants to continue to stay active       Depression Screen    05/21/2023   10:51 AM 05/21/2023    9:07 AM 02/12/2023   10:17 AM 12/13/2022   11:05 AM  11/13/2022   10:59 AM 10/24/2021    4:27 PM 10/18/2021    1:15 PM  PHQ 2/9 Scores  PHQ - 2 Score 0 0 0 0 0 0 0  PHQ- 9 Score 0     0     Fall Risk    05/21/2023   10:46 AM 05/21/2023    9:07 AM 02/12/2023   10:17 AM 12/13/2022   11:05 AM 11/13/2022   10:59 AM  Fall Risk   Falls in the past year? 1 0 0 0 0  Number falls in past yr: 0 0 0 0 0  Injury with Fall? 0 0 0 0 0  Risk for fall due to : History of fall(s);Impaired balance/gait No Fall Risks No Fall Risks No Fall Risks No Fall Risks  Follow up Falls evaluation completed;Falls prevention discussed Falls evaluation completed Falls evaluation completed Falls evaluation completed Falls evaluation completed    MEDICARE RISK AT HOME: Medicare Risk at Home Any stairs in or around the home?: Yes If so, are there any without handrails?: No Home free of loose throw rugs in walkways, pet beds, electrical cords, etc?: Yes Adequate lighting in your home to reduce risk of falls?: Yes Life alert?: Yes Use of a cane, walker or w/c?: Yes Grab bars in the bathroom?: Yes Shower chair or bench in shower?: Yes Elevated toilet seat or a handicapped toilet?: Yes      Cognitive Function:        05/21/2023   10:56 AM 10/24/2021    4:29 PM 01/12/2020   12:46 PM 12/16/2017    5:19 PM  6CIT Screen  What Year? 0 points 0 points 0 points 0 points  What month? 0 points 0 points 0 points 0 points  What time? 3 points 0 points 0 points 0 points  Count back from 20 0 points   0 points  Months in reverse 0 points 0 points  0 points  Repeat phrase    0 points  Total Score    0 points    Immunizations Immunization History  Administered Date(s) Administered   Fluad Trivalent(High Dose 65+) 03/26/2023   Influenza, High Dose Seasonal PF 03/28/2016, 04/12/2017, 03/07/2018, 04/13/2020  Influenza,inj,Quad PF,6+ Mos 06/23/2013, 05/03/2014, 05/11/2015   Influenza-Unspecified 04/12/2017   Moderna Sars-Covid-2 Vaccination 07/16/2019, 08/18/2019,  05/11/2020   Pneumococcal Conjugate-13 05/11/2015   Pneumococcal Polysaccharide-23 07/01/2017   Zoster, Live 05/31/2008    TDAP status: Due, Education has been provided regarding the importance of this vaccine. Advised may receive this vaccine at local pharmacy or Health Dept. Aware to provide a copy of the vaccination record if obtained from local pharmacy or Health Dept. Verbalized acceptance and understanding.  Flu Vaccine status: Up to date  Pneumococcal vaccine status: Up to date  Covid-19 vaccine status: Information provided on how to obtain vaccines.   Qualifies for Shingles Vaccine? Yes   Zostavax completed Yes   Shingrix Completed?: No.    Education has been provided regarding the importance of this vaccine. Patient has been advised to call insurance company to determine out of pocket expense if they have not yet received this vaccine. Advised may also receive vaccine at local pharmacy or Health Dept. Verbalized acceptance and understanding.  Screening Tests Health Maintenance  Topic Date Due   Medicare Annual Wellness (AWV)  10/25/2022   COVID-19 Vaccine (4 - 2023-24 season) 02/24/2023   Zoster Vaccines- Shingrix (1 of 2) 08/21/2023 (Originally 08/27/1989)   DTaP/Tdap/Td (1 - Tdap) 05/20/2024 (Originally [birth year]1958)   Pneumonia Vaccine 16+ Years old  Completed   INFLUENZA VACCINE  Completed   DEXA SCAN  Completed   HPV VACCINES  Aged Out   Colonoscopy  Discontinued    Health Maintenance  Health Maintenance Due  Topic Date Due   Medicare Annual Wellness (AWV)  10/25/2022   COVID-19 Vaccine (4 - 2023-24 season) 02/24/2023    Colorectal cancer screening: No longer required.   Mammogram status: No longer required due to Age.  Bone Density status: Completed 03/2006. Results reflect: Bone density results: OSTEOPENIA. Repeat every 2 years. Patient declines  Lung Cancer Screening: (Low Dose CT Chest recommended if Age 41-80 years, 20 pack-year currently smoking OR have  quit w/in 15years.) does not qualify.   Additional Screening:  Hepatitis C Screening: does not qualify; Completed NA age  Vision Screening: Recommended annual ophthalmology exams for early detection of glaucoma and other disorders of the eye. Is the patient up to date with their annual eye exam?  No  Who is the provider or what is the name of the office in which the patient attends annual eye exams? [facility] Patient will call to schedule an appointment If pt is not established with a provider, would they like to be referred to a provider to establish care? No .   Dental Screening: Recommended annual dental exams for proper oral hygiene    Community Resource Referral / Chronic Care Management: CRR required this visit?  No   CCM required this visit?  No     Plan:     I have personally reviewed and noted the following in the patient's chart:   Medical and social history Use of alcohol, tobacco or illicit drugs  Current medications and supplements including opioid prescriptions. Patient is not currently taking opioid prescriptions. Functional ability and status Nutritional status Physical activity Advanced directives List of other physicians Hospitalizations, surgeries, and ER visits in previous 12 months Vitals Screenings to include cognitive, depression, and falls Referrals and appointments  In addition, I have reviewed and discussed with patient certain preventive protocols, quality metrics, and best practice recommendations. A written personalized care plan for preventive services as well as general preventive health recommendations were provided to patient.  Sydell Axon, LPN   03/47/4259   After Visit Summary: (MyChart) Due to this being a telephonic visit, the after visit summary with patients personalized plan was offered to patient via MyChart   Nurse Notes: None

## 2023-05-21 NOTE — Progress Notes (Signed)
Assessment & Plan:  Vaginal irritation Assessment & Plan: Poor historian. She is not accompanied by her son.   Patient nontoxic in appearance.  Opted not to asked patient to get on exam table and perform pelvic exam.  Reviewed previous office visits in which wet prep was negative.  Counseled patient on vaginal irritation as it relates to vaginal atrophy.  Encouraged her to use vaginal Estrace more regularly 2-3 times per week.  Encouraged use of nystatin and ensure she is changing the panty liner regularly.  She politely declines home health at this time for help with administration of medications and compliance.  Pending urinalysis and urine culture.  Patient was unable to leave urine specimen today ;  she will bring this back to our office.      Return precautions given.   Risks, benefits, and alternatives of the medications and treatment plan prescribed today were discussed, and patient expressed understanding.   Education regarding symptom management and diagnosis given to patient on AVS either electronically or printed.  No follow-ups on file.  Rennie Plowman, FNP  Subjective:    Patient ID: Patricia Pacheco, female    DOB: Dec 08, 1940, 82 y.o.   MRN: [id number]  CC: Patricia Pacheco is a 82 y.o. female who presents today for an acute visit.    HPI: She is worried about another urinary infection.   Complains of vaginal itching and vaginal burning. She 'thinks' it has been 4-5 days.   She wears urinary pad 'to be on the safe side'.  She reports being continent of urine and stool  Urine is not dark  No fever, constipation, N, vomiting, abnormal vaginal discharge.  She is not using feminine spray or douching.    She is not using estrogen cream;  she keeps in the bathroom, and she doesn't recall use of nystatin. She thinks she used a couple of weeks ago.   She takes a shower every night.   She has a son that drives her here and is nurse. Son ensures she takes medications and  lives upstairs.   Husband lives in a facility.       Seen by Dr Lorin Picket 05/02/23 for dysuria  Provided nystatin   Wet prep negative yeast, bacterial vaginitis 03/20/23 and 05/02/23   Urine culture positive for E. Coli.  Urology prescribed Macrobid 05/03/23 Michiel Cowboy      Allergies: Mushroom extract complex Current Outpatient Medications on File Prior to Visit  Medication Sig Dispense Refill   Ascorbic Acid (VITAMIN C PO) Take by mouth daily.     aspirin 81 MG tablet Take 81 mg by mouth daily.     Calcium Carbonate-Vit D-Min (CALCIUM 1200 PO) Take by mouth daily.     celecoxib (CELEBREX) 200 MG capsule Take 1 capsule (200 mg total) by mouth daily. 90 capsule 3   estradiol (ESTRACE) 0.1 MG/GM vaginal cream Start 2g PV every day x 2 wk then taper dose gradually over 1-2 wk to maintenance dose usually 1-3 times per week. 42.5 g 0   Multiple Vitamin (MULTIVITAMIN) tablet Take 1 tablet by mouth daily.     nystatin cream (MYCOSTATIN) Apply 1 Application topically 2 (two) times daily. Apply to outside of vaginal area. 30 g 0   Omega-3 Fatty Acids (FISH OIL PO) Take by mouth daily.     Probiotic Product (PROBIOTIC PO) Take by mouth.     terconazole (TERAZOL 3) 0.8 % vaginal cream Place 1 applicator vaginally at bedtime. 20 g  0   nitrofurantoin, macrocrystal-monohydrate, (MACROBID) 100 MG capsule Take 1 capsule (100 mg total) by mouth every 12 (twelve) hours. (Patient not taking: Reported on 05/21/2023) 14 capsule 0   No current facility-administered medications on file prior to visit.    Review of Systems  Constitutional:  Negative for chills and fever.  Respiratory:  Negative for cough.   Cardiovascular:  Negative for chest pain and palpitations.  Gastrointestinal:  Negative for nausea and vomiting.  Genitourinary:  Positive for vaginal pain. Negative for decreased urine volume, difficulty urinating, frequency, genital sores, hematuria, vaginal bleeding and vaginal discharge.       Objective:    BP 128/80   Pulse 84   Temp 97.9 F (36.6 C) (Oral)   Ht 5\' 2"  (1.575 m)   Wt 124 lb 12.8 oz (56.6 kg)   LMP 08/29/1995   SpO2 97%   BMI 22.83 kg/m   BP Readings from Last 3 Encounters:  05/21/23 128/80  05/02/23 128/72  04/30/23 (!) 150/82   Wt Readings from Last 3 Encounters:  05/21/23 124 lb 12.8 oz (56.6 kg)  05/02/23 127 lb (57.6 kg)  04/30/23 125 lb (56.7 kg)    Physical Exam Vitals reviewed.  Constitutional:      Appearance: Normal appearance. She is well-developed.  Eyes:     Conjunctiva/sclera: Conjunctivae normal.  Cardiovascular:     Rate and Rhythm: Normal rate and regular rhythm.     Pulses: Normal pulses.     Heart sounds: Normal heart sounds.  Pulmonary:     Effort: Pulmonary effort is normal.     Breath sounds: Normal breath sounds. No wheezing, rhonchi or rales.  Abdominal:     General: Bowel sounds are normal. There is no distension.     Palpations: Abdomen is soft. Abdomen is not rigid. There is no fluid wave or mass.     Tenderness: There is no abdominal tenderness. There is no guarding or rebound.  Skin:    General: Skin is warm and dry.  Neurological:     Mental Status: She is alert.  Psychiatric:        Speech: Speech normal.        Behavior: Behavior normal.        Thought Content: Thought content normal.

## 2023-05-21 NOTE — Patient Instructions (Addendum)
Patricia Pacheco , Thank you for taking time to come for your Medicare Wellness Visit. I appreciate your ongoing commitment to your health goals. Please review the following plan we discussed and let me know if I can assist you in the future.   Referrals/Orders/Follow-Ups/Clinician Recommendations: Remember to update your Tetanus and shingles vaccines  This is a list of the screening recommended for you and due dates:  Health Maintenance  Topic Date Due   COVID-19 Vaccine (4 - 2023-24 season) 02/24/2023   Zoster (Shingles) Vaccine (1 of 2) 08/21/2023*   DTaP/Tdap/Td vaccine (1 - Tdap) 05/20/2024*   Medicare Annual Wellness Visit  05/20/2024   Pneumonia Vaccine  Completed   Flu Shot  Completed   DEXA scan (bone density measurement)  Completed   HPV Vaccine  Aged Out   Colon Cancer Screening  Discontinued  *Topic was postponed. The date shown is not the original due date.    Advanced directives: (Declined) Advance directive discussed with you today. Even though you declined this today, please call our office should you change your mind, and we can give you the proper paperwork for you to fill out.  Next Medicare Annual Wellness Visit scheduled for next year: Yes 05/26/24 @ 10:50

## 2023-05-21 NOTE — Patient Instructions (Addendum)
As discussed today, we have ordered a urinalysis and urine culture to screen for another urinary tract infection.  This test will take approximately 3 days.  Please call us if your symptoms were to change, and most certainly if they were to worsen.    I would like for you to start over-the-counter cranberry supplement.[employer] brand makes a cranberry urinary tract health medication.  You may take this daily.  The hope would be to prevent recurrent UTIs.     As discussed, I would like for you to take this paper into your bathroom and tape to the mirror:  Use the vaginal estrogen twice to three times per week indefinitely.  This is to treat vaginal dryness .   I also would like for you to use nystatin cream as a barrier cream; this cream is used prevent yeast infection.  Yeast infections can be more common in particular if you are having any urinary incontinence.  Please keep a fresh panty liner and change frequently if it is become soiled.    Nice to see you!

## 2023-05-21 NOTE — Assessment & Plan Note (Addendum)
Poor historian. She is not accompanied by her son.   Patient nontoxic in appearance.  Opted not to asked patient to get on exam table and perform pelvic exam.  Reviewed previous office visits in which wet prep was negative.  Counseled patient on vaginal irritation as it relates to vaginal atrophy.  Encouraged her to use vaginal Estrace more regularly 2-3 times per week.  Encouraged use of nystatin and ensure she is changing the panty liner regularly.  She politely declines home health at this time for help with administration of medications and compliance.  Pending urinalysis and urine culture.  Patient was unable to leave urine specimen today ;  she will bring this back to our office.

## 2023-06-11 NOTE — Progress Notes (Deleted)
06/13/2023 10:32 PM   Flavia H Shadix 03-May-1941 [id number]  Referring provider: Dale [place], MD [address] [place],  [place] [address]  Urological history: 1. OAB -Contributing factors of age, GSM, hypertension, asthma and anxiety -RUS (09/2021) - NED -cysto (09/2021) - ned -Vaginal estrogen cream  No chief complaint on file.  HPI: Patricia Pacheco is a 82 y.o. Female who presents today for persistent micro heme.   Previous records reviewed.   She continues to have persistent micro heme on her ua's.    She has not any more daytime voids, 1-2 episodes of nocturia with a mild urge to urinate.  She does not have any urinary leakage.  She does engage in toilet mapping.  She does complain of vaginal burning.  She had been prescribed vaginal estrogen cream, but she feels she has not used this in a while.  Patient denies any modifying or aggravating factors.  Patient denies any recent UTI's, gross hematuria, dysuria or suprapubic/flank pain.  Patient denies any fevers, chills, nausea or vomiting.    UA pyuria, hematuria and bacteriuria  PVR 0 mL   She also has had some rashes developed in between her fingers and a mole that she is concerned about.  She does have a dermatologist and she is instructed to contact them for further evaluation of these areas.     PMH: Past Medical History:  Diagnosis Date   Allergy    allergy shots (Juengel)   Anxiety    Arthritis    Asthma    CAD (coronary artery disease)    cath 11/27/04 - moderated LAD disease   Cancer (HCC)    skin   GERD (gastroesophageal reflux disease)    EGD (05/25/04) - slight presbyesophagus, mild esophagitis, mild gastritis, mild duodenitis - positive H. pylori.    Neuropathy     Surgical History: Past Surgical History:  Procedure Laterality Date   ABDOMINAL HYSTERECTOMY     APPENDECTOMY  1967   COLONOSCOPY WITH PROPOFOL N/A 11/16/2019   Procedure: COLONOSCOPY WITH PROPOFOL;  Surgeon:  Toney Reil, MD;  Location: [facility] ENDOSCOPY;  Service: Gastroenterology;  Laterality: N/A;   ESOPHAGOGASTRODUODENOSCOPY (EGD) WITH PROPOFOL N/A 11/16/2019   Procedure: ESOPHAGOGASTRODUODENOSCOPY (EGD) WITH PROPOFOL;  Surgeon: Toney Reil, MD;  Location: [facility] ENDOSCOPY;  Service: Gastroenterology;  Laterality: N/A;    Home Medications:  Allergies as of 06/13/2023       Reactions   Mushroom Extract Complex (do Not Select) Hives   All Mushrooms         Medication List        Accurate as of June 11, 2023 10:32 PM. If you have any questions, ask your nurse or doctor.          aspirin 81 MG tablet Take 81 mg by mouth daily.   CALCIUM 1200 PO Take by mouth daily.   celecoxib 200 MG capsule Commonly known as: CeleBREX Take 1 capsule (200 mg total) by mouth daily.   estradiol 0.1 MG/GM vaginal cream Commonly known as: ESTRACE Start 2g PV every day x 2 wk then taper dose gradually over 1-2 wk to maintenance dose usually 1-3 times per week.   FISH OIL PO Take by mouth daily.   multivitamin tablet Take 1 tablet by mouth daily.   nitrofurantoin (macrocrystal-monohydrate) 100 MG capsule Commonly known as: MACROBID Take 1 capsule (100 mg total) by mouth every 12 (twelve) hours.   nystatin cream Commonly known as: MYCOSTATIN Apply 1  Application topically 2 (two) times daily. Apply to outside of vaginal area.   PROBIOTIC PO Take by mouth.   terconazole 0.8 % vaginal cream Commonly known as: TERAZOL 3 Place 1 applicator vaginally at bedtime.   VITAMIN C PO Take by mouth daily.        Allergies:  Allergies  Allergen Reactions   Mushroom Extract Complex (Do Not Select) Hives    All Mushrooms     Family History: Family History  Problem Relation Age of Onset   Arthritis Mother    Stroke Sister    Breast cancer Neg Hx     Social History:  reports that she has never smoked. She has never used smokeless tobacco. She reports that she does not  drink alcohol and does not use drugs.  ROS: Pertinent ROS in HPI  Physical Exam: LMP 08/29/1995   Constitutional:  Well nourished. Alert and oriented, No acute distress. HEENT: [place] AT, moist mucus membranes.  Trachea midline Cardiovascular: No clubbing, cyanosis, or edema. Respiratory: Normal respiratory effort, no increased work of breathing. GU: No CVA tenderness.  No bladder fullness or masses.  Recession of labia minora, dry, pale vulvar vaginal mucosa and loss of mucosal ridges and folds.  Normal urethral meatus, no lesions, no prolapse, no discharge.   No urethral masses, tenderness and/or tenderness. No bladder fullness, tenderness or masses. Pale vagina mucosa, poor estrogen effect, no discharge, no lesions, fair pelvic support, grade II  cystocele and no rectocele noted.  Anus and perineum are without rashes or lesions.     Neurologic: Grossly intact, no focal deficits, moving all 4 extremities. Psychiatric: Normal mood and affect.    Laboratory Data: Lab Results  Component Value Date   WBC 5.4 03/26/2023   HGB 12.0 03/26/2023   HCT 36.6 03/26/2023   MCV 91.2 03/26/2023   PLT 194.0 03/26/2023    Lab Results  Component Value Date   CREATININE 0.96 03/26/2023    Lab Results  Component Value Date   HGBA1C 6.0 11/13/2022    Lab Results  Component Value Date   TSH 1.78 11/13/2022       Component Value Date/Time   CHOL 189 03/26/2023 1133   HDL 72.50 03/26/2023 1133   CHOLHDL 3 03/26/2023 1133   VLDL 22.2 03/26/2023 1133   LDLCALC 94 03/26/2023 1133    Lab Results  Component Value Date   AST 20 03/26/2023   Lab Results  Component Value Date   ALT 13 03/26/2023    Urinalysis See EPIC and HPI I have reviewed the labs.   Pertinent Imaging:  04/30/23 11:14  Scan Result 0     Assessment & Plan:    1. High risk hematuria -non- smoker -work up 2023 - NED -no reports of gross heme -UA micro heme -Urine sent for culture -Discussed that the next  step for evaluating her persistent microscopic hematuria would be a CT urogram.  She denied any allergies to contrast, iodine or taking metformin -we discussed that for a CT urogram a contrast material will be injected into a vein and that in rare instances, an allergic reaction can result and may even life threatening (1:100,000)  The patient denies any allergies to contrast, iodine and/or seafood and is not taking metformin. -She is agreeable and would like to proceed with a CT urogram as she wants to get to the bottom of why she continues to have microscopic hematuria  2. GSM -encouraged vaginal estrogen cream use   3. OAB -  PVR demonstrated adequate emptying  No follow-ups on file.  These notes generated with voice recognition software. I apologize for typographical errors.  [name]  [facility] [address]  [address] [place], [place] [address] [phone]

## 2023-06-13 ENCOUNTER — Ambulatory Visit: Payer: Medicare Other | Admitting: Urology

## 2023-07-30 ENCOUNTER — Encounter: Payer: Self-pay | Admitting: Internal Medicine

## 2023-07-30 ENCOUNTER — Ambulatory Visit (INDEPENDENT_AMBULATORY_CARE_PROVIDER_SITE_OTHER): Payer: Medicare Other | Admitting: Internal Medicine

## 2023-07-30 DIAGNOSIS — E7800 Pure hypercholesterolemia, unspecified: Secondary | ICD-10-CM

## 2023-07-30 DIAGNOSIS — R739 Hyperglycemia, unspecified: Secondary | ICD-10-CM

## 2023-07-30 DIAGNOSIS — D649 Anemia, unspecified: Secondary | ICD-10-CM

## 2023-07-30 DIAGNOSIS — R319 Hematuria, unspecified: Secondary | ICD-10-CM

## 2023-07-30 NOTE — Progress Notes (Deleted)
 Subjective:    Patient ID: Patricia Pacheco, female    DOB: [birth year], 83 y.o.   MRN: [id number]  Patient here for No chief complaint on file.   HPI Here for a scheduled follow up - follow up regarding hypercholesterolemia, hypertension and IDA. Saw urology 04/30/23 - w/up for hematuria.  Recommended CT urogram. She has not completed this test. Also recommended estrogen cream.    Past Medical History:  Diagnosis Date   Allergy    allergy shots (Juengel)   Anxiety    Arthritis    Asthma    CAD (coronary artery disease)    cath 11/27/04 - moderated LAD disease   Cancer (HCC)    skin   GERD (gastroesophageal reflux disease)    EGD (05/25/04) - slight presbyesophagus, mild esophagitis, mild gastritis, mild duodenitis - positive H. pylori.    Neuropathy    Past Surgical History:  Procedure Laterality Date   ABDOMINAL HYSTERECTOMY     APPENDECTOMY  1967   COLONOSCOPY WITH PROPOFOL  N/A 11/16/2019   Procedure: COLONOSCOPY WITH PROPOFOL ;  Surgeon: Unk Corinn Skiff, MD;  Location: [facility] ENDOSCOPY;  Service: Gastroenterology;  Laterality: N/A;   ESOPHAGOGASTRODUODENOSCOPY (EGD) WITH PROPOFOL  N/A 11/16/2019   Procedure: ESOPHAGOGASTRODUODENOSCOPY (EGD) WITH PROPOFOL ;  Surgeon: Unk Corinn Skiff, MD;  Location: [facility] ENDOSCOPY;  Service: Gastroenterology;  Laterality: N/A;   Family History  Problem Relation Age of Onset   Arthritis Mother    Stroke Sister    Breast cancer Neg Hx    Social History   Socioeconomic History   Marital status: Married    Spouse name: Not on file   Number of children: Not on file   Years of education: Not on file   Highest education level: Not on file  Occupational History   Not on file  Tobacco Use   Smoking status: Never   Smokeless tobacco: Never  Vaping Use   Vaping status: Never Used  Substance and Sexual Activity   Alcohol use: No    Alcohol/week: 0.0 standard drinks of alcohol   Drug use: No   Sexual activity: Not on file  Other Topics  Concern   Not on file  Social History Narrative   Not on file   Social Drivers of Health   Financial Resource Strain: Low Risk  (05/21/2023)   Overall Financial Resource Strain (CARDIA)    Difficulty of Paying Living Expenses: Not hard at all  Food Insecurity: No Food Insecurity (05/21/2023)   Hunger Vital Sign    Worried About Running Out of Food in the Last Year: Never true    Ran Out of Food in the Last Year: Never true  Transportation Needs: No Transportation Needs (05/21/2023)   PRAPARE - Administrator, Civil Service (Medical): No    Lack of Transportation (Non-Medical): No  Physical Activity: Inactive (05/21/2023)   Exercise Vital Sign    Days of Exercise per Week: 0 days    Minutes of Exercise per Session: 0 min  Stress: No Stress Concern Present (05/21/2023)   [employer] of Occupational Health - Occupational Stress Questionnaire    Feeling of Stress : Not at all  Social Connections: Moderately Isolated (05/21/2023)   Social Connection and Isolation Panel [NHANES]    Frequency of Communication with Friends and Family: More than three times a week    Frequency of Social Gatherings with Friends and Family: Twice a week    Attends Religious Services: Never    Active Member of  Clubs or Organizations: No    Attends Banker Meetings: Never    Marital Status: Married     Review of Systems     Objective:     LMP 08/29/1995  Wt Readings from Last 3 Encounters:  05/21/23 128 lb (58.1 kg)  05/21/23 124 lb 12.8 oz (56.6 kg)  05/02/23 127 lb (57.6 kg)    Physical Exam  {Perform Simple Foot Exam  Perform Detailed exam:1} {Insert foot Exam (Optional):30965}   Outpatient Encounter Medications as of 07/30/2023  Medication Sig   Ascorbic Acid (VITAMIN C PO) Take by mouth daily.   aspirin 81 MG tablet Take 81 mg by mouth daily.   Calcium  Carbonate-Vit D-Min (CALCIUM  1200 PO) Take by mouth daily.   celecoxib  (CELEBREX ) 200 MG capsule Take 1  capsule (200 mg total) by mouth daily.   estradiol  (ESTRACE ) 0.1 MG/GM vaginal cream Start 2g PV every day x 2 wk then taper dose gradually over 1-2 wk to maintenance dose usually 1-3 times per week.   Multiple Vitamin (MULTIVITAMIN) tablet Take 1 tablet by mouth daily.   nitrofurantoin , macrocrystal-monohydrate, (MACROBID ) 100 MG capsule Take 1 capsule (100 mg total) by mouth every 12 (twelve) hours. (Patient not taking: Reported on 05/21/2023)   nystatin  cream (MYCOSTATIN ) Apply 1 Application topically 2 (two) times daily. Apply to outside of vaginal area.   Omega-3 Fatty Acids (FISH OIL PO) Take by mouth daily.   Probiotic Product (PROBIOTIC PO) Take by mouth.   terconazole  (TERAZOL 3 ) 0.8 % vaginal cream Place 1 applicator vaginally at bedtime.   No facility-administered encounter medications on file as of 07/30/2023.     Lab Results  Component Value Date   WBC 5.4 03/26/2023   HGB 12.0 03/26/2023   HCT 36.6 03/26/2023   PLT 194.0 03/26/2023   GLUCOSE 93 03/26/2023   CHOL 189 03/26/2023   TRIG 111.0 03/26/2023   HDL 72.50 03/26/2023   LDLDIRECT 115.0 09/04/2012   LDLCALC 94 03/26/2023   ALT 13 03/26/2023   AST 20 03/26/2023   NA 141 03/26/2023   K 3.5 03/26/2023   CL 104 03/26/2023   CREATININE 0.96 03/26/2023   BUN 16 03/26/2023   CO2 30 03/26/2023   TSH 1.78 11/13/2022   HGBA1C 6.0 11/13/2022    No results found.     Assessment & Plan:  There are no diagnoses linked to this encounter.   Allena Hamilton, MD

## 2023-07-30 NOTE — Progress Notes (Signed)
Patient ID: Patricia Pacheco, female   DOB: May 26, 1941, 83 y.o.   MRN: [id number] No show for appt

## 2023-10-04 ENCOUNTER — Encounter: Payer: Self-pay | Admitting: Nurse Practitioner

## 2023-10-04 ENCOUNTER — Ambulatory Visit: Admitting: Nurse Practitioner

## 2023-10-04 ENCOUNTER — Ambulatory Visit: Payer: Self-pay

## 2023-10-04 VITALS — BP 130/76 | HR 85 | Temp 97.7°F | Ht 62.0 in | Wt 126.8 lb

## 2023-10-04 DIAGNOSIS — R300 Dysuria: Secondary | ICD-10-CM | POA: Diagnosis not present

## 2023-10-04 LAB — POCT URINALYSIS DIPSTICK
Bilirubin, UA: NEGATIVE
Glucose, UA: NEGATIVE
Ketones, UA: NEGATIVE
Leukocytes, UA: NEGATIVE
Nitrite, UA: NEGATIVE
Protein, UA: NEGATIVE
Spec Grav, UA: 1.015 (ref 1.010–1.025)
Urobilinogen, UA: 0.2 U/dL
pH, UA: 5 (ref 5.0–8.0)

## 2023-10-04 MED ORDER — NYSTATIN 100000 UNIT/GM EX CREA: 1.0000 | TOPICAL_CREAM | Freq: Two times a day (BID) | CUTANEOUS | 0 refills | Status: AC

## 2023-10-04 NOTE — Telephone Encounter (Signed)
 Copied from CRM [id number]. Topic: Clinical - Red Word Triage >> Oct 04, 2023  8:57 AM Arley Phenix D wrote: Red Word that prompted transfer to Nurse Triage: Burning extremely   Patient stated that her lower area is burning and thinks she may have an infection. Patient is requesting to schedule an appointment.    Chief Complaint: Urinary burning,pain to back, frequency Symptoms: Above Frequency: Wednesday Pertinent Negatives: Patient denies fever Disposition: [] ED /[] Urgent Care (no appt availability in office) / [x] Appointment(In office/virtual)/ []  [facility]/ [] Home Care/ [] Refused Recommended Disposition /[] [place] [facility]/ []  Follow-up with PCP Additional Notes: Agrees with appointment.  Reason for Disposition  Urinating more frequently than usual (i.e., frequency)  Answer Assessment - Initial Assessment Questions 1. SYMPTOM: "What's the main symptom you're concerned about?" (e.g., frequency, incontinence)     Burning, low back pain 2. ONSET: "When did the    start?"     Wednesday 3. PAIN: "Is there any pain?" If Yes, ask: "How bad is it?" (Scale: 1-10; mild, moderate, severe)     Yes 4. CAUSE: "What do you think is causing the symptoms?"     UTI 5. OTHER SYMPTOMS: "Do you have any other symptoms?" (e.g., blood in urine, fever, flank pain, pain with urination)     Back pain 6. PREGNANCY: "Is there any chance you are pregnant?" "When was your last menstrual period?"     No  Protocols used: Urinary Symptoms-A-AH

## 2023-10-04 NOTE — Progress Notes (Signed)
 Established Patient Office Visit  Subjective:  Patient ID: Patricia Pacheco, female    DOB: December 08, 1940  Age: 83 y.o. MRN: [id number]  CC:  Chief Complaint  Patient presents with   Urinary Tract Infection    Symptoms started wednesday  Discussed the use of a AI scribe software for clinical note transcription with the patient, who gave verbal consent to proceed.   HPI  Patricia Pacheco is a 83 year old female who presents with pain and burning sensation during urination. She is accompanied by her son.  She experiences pain and a burning sensation in the vaginal area, particularly during urination. These symptoms began on Wednesday and the pain is persistent, with a burning sensation even while sitting.  She also has increased urinary frequency, feeling pressure, and needing to urinate more often than usual, although she does not produce a large volume of urine each time.    HPI   Past Medical History:  Diagnosis Date   Allergy    allergy shots (Juengel)   Anxiety    Arthritis    Asthma    CAD (coronary artery disease)    cath 11/27/04 - moderated LAD disease   Cancer (HCC)    skin   GERD (gastroesophageal reflux disease)    EGD (05/25/04) - slight presbyesophagus, mild esophagitis, mild gastritis, mild duodenitis - positive H. pylori.    Neuropathy     Past Surgical History:  Procedure Laterality Date   ABDOMINAL HYSTERECTOMY     APPENDECTOMY  1967   COLONOSCOPY WITH PROPOFOL N/A 11/16/2019   Procedure: COLONOSCOPY WITH PROPOFOL;  Surgeon: Toney Reil, MD;  Location: [facility] ENDOSCOPY;  Service: Gastroenterology;  Laterality: N/A;   ESOPHAGOGASTRODUODENOSCOPY (EGD) WITH PROPOFOL N/A 11/16/2019   Procedure: ESOPHAGOGASTRODUODENOSCOPY (EGD) WITH PROPOFOL;  Surgeon: Toney Reil, MD;  Location: [facility] ENDOSCOPY;  Service: Gastroenterology;  Laterality: N/A;    Family History  Problem Relation Age of Onset   Arthritis Mother    Stroke Sister    Breast cancer Neg Hx      Social History   Socioeconomic History   Marital status: Married    Spouse name: Not on file   Number of children: Not on file   Years of education: Not on file   Highest education level: Not on file  Occupational History   Not on file  Tobacco Use   Smoking status: Never   Smokeless tobacco: Never  Vaping Use   Vaping status: Never Used  Substance and Sexual Activity   Alcohol use: No    Alcohol/week: 0.0 standard drinks of alcohol   Drug use: No   Sexual activity: Not on file  Other Topics Concern   Not on file  Social History Narrative   Not on file   Social Drivers of Health   Financial Resource Strain: Low Risk  (05/21/2023)   Overall Financial Resource Strain (CARDIA)    Difficulty of Paying Living Expenses: Not hard at all  Food Insecurity: No Food Insecurity (05/21/2023)   Hunger Vital Sign    Worried About Running Out of Food in the Last Year: Never true    Ran Out of Food in the Last Year: Never true  Transportation Needs: No Transportation Needs (05/21/2023)   PRAPARE - Administrator, Civil Service (Medical): No    Lack of Transportation (Non-Medical): No  Physical Activity: Inactive (05/21/2023)   Exercise Vital Sign    Days of Exercise per Week: 0 days  Minutes of Exercise per Session: 0 min  Stress: No Stress Concern Present (05/21/2023)   [employer] of Occupational Health - Occupational Stress Questionnaire    Feeling of Stress : Not at all  Social Connections: Moderately Isolated (05/21/2023)   Social Connection and Isolation Panel [NHANES]    Frequency of Communication with Friends and Family: More than three times a week    Frequency of Social Gatherings with Friends and Family: Twice a week    Attends Religious Services: Never    Database administrator or Organizations: No    Attends Banker Meetings: Never    Marital Status: Married  Catering manager Violence: Not At Risk (05/21/2023)   Humiliation,  Afraid, Rape, and Kick questionnaire    Fear of Current or Ex-Partner: No    Emotionally Abused: No    Physically Abused: No    Sexually Abused: No     Outpatient Medications Prior to Visit  Medication Sig Dispense Refill   Ascorbic Acid (VITAMIN C PO) Take by mouth daily.     aspirin 81 MG tablet Take 81 mg by mouth daily.     Calcium Carbonate-Vit D-Min (CALCIUM 1200 PO) Take by mouth daily.     celecoxib (CELEBREX) 200 MG capsule Take 1 capsule (200 mg total) by mouth daily. 90 capsule 3   estradiol (ESTRACE) 0.1 MG/GM vaginal cream Start 2g PV every day x 2 wk then taper dose gradually over 1-2 wk to maintenance dose usually 1-3 times per week. 42.5 g 0   Multiple Vitamin (MULTIVITAMIN) tablet Take 1 tablet by mouth daily.     nystatin cream (MYCOSTATIN) Apply 1 Application topically 2 (two) times daily. Apply to outside of vaginal area. 30 g 0   Omega-3 Fatty Acids (FISH OIL PO) Take by mouth daily.     Probiotic Product (PROBIOTIC PO) Take by mouth.     terconazole (TERAZOL 3) 0.8 % vaginal cream Place 1 applicator vaginally at bedtime. 20 g 0   No facility-administered medications prior to visit.    Allergies  Allergen Reactions   Mushroom Extract Complex (Obsolete) Hives    All Mushrooms     ROS Review of Systems Negative unless indicated in HPI.    Objective:    Physical Exam Constitutional:      Appearance: Normal appearance.  Cardiovascular:     Rate and Rhythm: Normal rate and regular rhythm.     Pulses: Normal pulses.     Heart sounds: Normal heart sounds.  Abdominal:     General: Bowel sounds are normal.     Palpations: Abdomen is soft.     Tenderness: There is no abdominal tenderness. There is no right CVA tenderness or left CVA tenderness.     Comments: Suprapubic pressure, no tenderness  Musculoskeletal:     Cervical back: Normal range of motion.  Neurological:     General: No focal deficit present.     Mental Status: She is alert. Mental status  is at baseline.  Psychiatric:        Mood and Affect: Mood normal.        Behavior: Behavior normal.        Thought Content: Thought content normal.        Judgment: Judgment normal.     BP 130/76   Pulse 85   Temp 97.7 F (36.5 C) (Oral)   Ht 5\' 2"  (1.575 m)   Wt 126 lb 12.8 oz (57.5 kg)   LMP 08/29/1995  SpO2 99%   BMI 23.19 kg/m  Wt Readings from Last 3 Encounters:  10/04/23 126 lb 12.8 oz (57.5 kg)  05/21/23 128 lb (58.1 kg)  05/21/23 124 lb 12.8 oz (56.6 kg)     Health Maintenance  Topic Date Due   Zoster Vaccines- Shingrix (1 of 2) 08/27/1989   COVID-19 Vaccine (4 - 2024-25 season) 02/24/2023   DTaP/Tdap/Td (1 - Tdap) 05/20/2024 (Originally 08/28/1959)   INFLUENZA VACCINE  01/24/2024   Medicare Annual Wellness (AWV)  05/20/2024   Pneumonia Vaccine 43+ Years old  Completed   DEXA SCAN  Completed   HPV VACCINES  Aged Out   Meningococcal B Vaccine  Aged Out   Colonoscopy  Discontinued    There are no preventive care reminders to display for this patient.  Lab Results  Component Value Date   TSH 1.78 11/13/2022   Lab Results  Component Value Date   WBC 5.4 03/26/2023   HGB 12.0 03/26/2023   HCT 36.6 03/26/2023   MCV 91.2 03/26/2023   PLT 194.0 03/26/2023   Lab Results  Component Value Date   NA 141 03/26/2023   K 3.5 03/26/2023   CO2 30 03/26/2023   GLUCOSE 93 03/26/2023   BUN 16 03/26/2023   CREATININE 0.96 03/26/2023   BILITOT 0.4 03/26/2023   ALKPHOS 83 03/26/2023   AST 20 03/26/2023   ALT 13 03/26/2023   PROT 6.9 03/26/2023   ALBUMIN 4.1 03/26/2023   CALCIUM 9.4 03/26/2023   GFR 54.69 (L) 03/26/2023   Lab Results  Component Value Date   CHOL 189 03/26/2023   Lab Results  Component Value Date   HDL 72.50 03/26/2023   Lab Results  Component Value Date   LDLCALC 94 03/26/2023   Lab Results  Component Value Date   TRIG 111.0 03/26/2023   Lab Results  Component Value Date   CHOLHDL 3 03/26/2023   Lab Results  Component Value  Date   HGBA1C 6.0 11/13/2022      Assessment & Plan:  Dysuria Assessment & Plan: Burning and pain during urination with microscopic hematuria on urinalysis. Declined pelvic exam. Advised to use estrace cream regularly. Will try nystatin cream. Urine culture and microscopy pending.    Orders: -     Urinalysis, Routine w reflex microscopic -     Urine Culture -     POCT urinalysis dipstick    Follow-up: No follow-ups on file.   Kara Dies, NP

## 2023-10-04 NOTE — Assessment & Plan Note (Signed)
 Burning and pain during urination with microscopic hematuria on urinalysis. Declined pelvic exam. Advised to use estrace cream regularly. Will try nystatin cream. Urine culture and microscopy pending.

## 2023-10-05 LAB — URINALYSIS, ROUTINE W REFLEX MICROSCOPIC
Bacteria, UA: NONE SEEN /HPF
Bilirubin Urine: NEGATIVE
Glucose, UA: NEGATIVE
Hyaline Cast: NONE SEEN /LPF
Ketones, ur: NEGATIVE
Leukocytes,Ua: NEGATIVE
Nitrite: NEGATIVE
Protein, ur: NEGATIVE
RBC / HPF: NONE SEEN /HPF (ref 0–2)
Specific Gravity, Urine: 1.014 (ref 1.001–1.035)
Squamous Epithelial / HPF: NONE SEEN /HPF (ref <=5)
WBC, UA: NONE SEEN /HPF (ref 0–5)
pH: 5.5 (ref 5.0–8.0)

## 2023-10-05 LAB — URINE CULTURE
MICRO NUMBER:: 16319481
Result:: NO GROWTH
SPECIMEN QUALITY:: ADEQUATE

## 2023-10-05 LAB — MICROSCOPIC MESSAGE

## 2023-10-06 ENCOUNTER — Encounter: Payer: Self-pay | Admitting: Nurse Practitioner

## 2023-10-30 ENCOUNTER — Emergency Department
Admission: EM | Admit: 2023-10-30 | Discharge: 2023-10-30 | Disposition: A | Discharge status: Home / Self Care | Attending: Emergency Medicine | Admitting: Emergency Medicine

## 2023-10-30 ENCOUNTER — Emergency Department

## 2023-10-30 ENCOUNTER — Other Ambulatory Visit: Payer: Self-pay

## 2023-10-30 DIAGNOSIS — S6992XA Unspecified injury of left wrist, hand and finger(s), initial encounter: Secondary | ICD-10-CM | POA: Diagnosis not present

## 2023-10-30 DIAGNOSIS — S50312A Abrasion of left elbow, initial encounter: Secondary | ICD-10-CM | POA: Insufficient documentation

## 2023-10-30 DIAGNOSIS — Z8673 Personal history of transient ischemic attack (TIA), and cerebral infarction without residual deficits: Secondary | ICD-10-CM | POA: Insufficient documentation

## 2023-10-30 DIAGNOSIS — S60812A Abrasion of left wrist, initial encounter: Secondary | ICD-10-CM | POA: Diagnosis not present

## 2023-10-30 DIAGNOSIS — S60311A Abrasion of right thumb, initial encounter: Secondary | ICD-10-CM | POA: Insufficient documentation

## 2023-10-30 DIAGNOSIS — Z043 Encounter for examination and observation following other accident: Secondary | ICD-10-CM | POA: Diagnosis not present

## 2023-10-30 DIAGNOSIS — G319 Degenerative disease of nervous system, unspecified: Secondary | ICD-10-CM | POA: Diagnosis not present

## 2023-10-30 DIAGNOSIS — G9389 Other specified disorders of brain: Secondary | ICD-10-CM | POA: Diagnosis not present

## 2023-10-30 DIAGNOSIS — S60415A Abrasion of left ring finger, initial encounter: Secondary | ICD-10-CM | POA: Insufficient documentation

## 2023-10-30 DIAGNOSIS — I6523 Occlusion and stenosis of bilateral carotid arteries: Secondary | ICD-10-CM | POA: Insufficient documentation

## 2023-10-30 DIAGNOSIS — S63289A Dislocation of proximal interphalangeal joint of unspecified finger, initial encounter: Secondary | ICD-10-CM | POA: Diagnosis not present

## 2023-10-30 DIAGNOSIS — Z23 Encounter for immunization: Secondary | ICD-10-CM | POA: Insufficient documentation

## 2023-10-30 DIAGNOSIS — W010XXA Fall on same level from slipping, tripping and stumbling without subsequent striking against object, initial encounter: Secondary | ICD-10-CM | POA: Insufficient documentation

## 2023-10-30 DIAGNOSIS — M1812 Unilateral primary osteoarthritis of first carpometacarpal joint, left hand: Secondary | ICD-10-CM | POA: Diagnosis not present

## 2023-10-30 DIAGNOSIS — M25552 Pain in left hip: Secondary | ICD-10-CM | POA: Diagnosis not present

## 2023-10-30 DIAGNOSIS — M25522 Pain in left elbow: Secondary | ICD-10-CM | POA: Diagnosis not present

## 2023-10-30 DIAGNOSIS — M25572 Pain in left ankle and joints of left foot: Secondary | ICD-10-CM | POA: Diagnosis not present

## 2023-10-30 DIAGNOSIS — M4802 Spinal stenosis, cervical region: Secondary | ICD-10-CM | POA: Diagnosis not present

## 2023-10-30 DIAGNOSIS — S63287A Dislocation of proximal interphalangeal joint of left little finger, initial encounter: Secondary | ICD-10-CM | POA: Insufficient documentation

## 2023-10-30 DIAGNOSIS — S63259A Unspecified dislocation of unspecified finger, initial encounter: Secondary | ICD-10-CM

## 2023-10-30 DIAGNOSIS — M47812 Spondylosis without myelopathy or radiculopathy, cervical region: Secondary | ICD-10-CM | POA: Diagnosis not present

## 2023-10-30 DIAGNOSIS — M1612 Unilateral primary osteoarthritis, left hip: Secondary | ICD-10-CM | POA: Diagnosis not present

## 2023-10-30 DIAGNOSIS — M85842 Other specified disorders of bone density and structure, left hand: Secondary | ICD-10-CM | POA: Diagnosis not present

## 2023-10-30 DIAGNOSIS — R918 Other nonspecific abnormal finding of lung field: Secondary | ICD-10-CM | POA: Diagnosis not present

## 2023-10-30 DIAGNOSIS — W19XXXA Unspecified fall, initial encounter: Secondary | ICD-10-CM

## 2023-10-30 HISTORY — DX: Essential (primary) hypertension: I10

## 2023-10-30 HISTORY — DX: Unspecified dementia, unspecified severity, without behavioral disturbance, psychotic disturbance, mood disturbance, and anxiety: F03.90

## 2023-10-30 MED ORDER — LIDOCAINE HCL (PF) 1 % IJ SOLN
5.0000 mL | Freq: Once | INTRAMUSCULAR | Status: AC
  Administered 2023-10-30: 5 mL via INTRADERMAL
  Filled 2023-10-30: qty 5

## 2023-10-30 MED ORDER — TETANUS-DIPHTH-ACELL PERTUSSIS 5-2.5-18.5 LF-MCG/0.5 IM SUSY
0.5000 mL | PREFILLED_SYRINGE | Freq: Once | INTRAMUSCULAR | Status: AC
  Administered 2023-10-30: 0.5 mL via INTRAMUSCULAR
  Filled 2023-10-30: qty 0.5

## 2023-10-30 NOTE — ED Provider Notes (Signed)
 [facility] Provider Note    Event Date/Time   First MD Initiated Contact with Patient 10/30/23 1641     (approximate)   History   Fall and Finger Injury   HPI Patricia Pacheco is a 83 y.o. female presenting today for fall.  Patient reportedly got tripped up and fell to the ground.  She does not think she hit her head but does not fully remember.  Complaining of pain in her left elbow, left hand, left hip, left ankle.  Denies being on blood thinners.  Several abrasions scattered throughout her extremities.     Physical Exam   Triage Vital Signs: ED Triage Vitals  Encounter Vitals Group     BP 10/30/23 1517 (!) 192/81     Systolic BP Percentile --      Diastolic BP Percentile --      Pulse Rate 10/30/23 1517 78     Resp 10/30/23 1517 20     Temp 10/30/23 1517 98.3 F (36.8 C)     Temp Source 10/30/23 1517 Oral     SpO2 10/30/23 1517 99 %     Weight 10/30/23 1519 127 lb (57.6 kg)     Height 10/30/23 1519 5\' 3"  (1.6 m)     Head Circumference --      Peak Flow --      Pain Score 10/30/23 1515 8     Pain Loc --      Pain Education --      Exclude from Growth Chart --     Most recent vital signs: Vitals:   10/30/23 1517  BP: (!) 192/81  Pulse: 78  Resp: 20  Temp: 98.3 F (36.8 C)  SpO2: 99%   I have reviewed the vital signs. General:  Awake, alert, no acute distress. Head:  Normocephalic, Atraumatic. EENT:  PERRL, EOMI, Oral mucosa pink and moist, Neck is supple. Cardiovascular: Regular rate, 2+ distal pulses. Respiratory:  Normal respiratory effort, symmetrical expansion, no distress.   Extremities:  Moving all four extremities through full ROM without pain.  Notable deformity to left pinky finger which appears to be dislocated Neuro:  Alert and oriented.  Interacting appropriately.   Skin: Scattered abrasions present throughout left elbow, right thumb, left lateral wrist, left 4th and 5th digits Psych: Appropriate affect.    ED  Results / Procedures / Treatments   Labs (all labs ordered are listed, but only abnormal results are displayed) Labs Reviewed - No data to display   EKG    RADIOLOGY Independently interpreted CT imaging of head and neck with no acute pathology.  Independently interpreted x-ray of left hand showing dislocated left fifth digit.  No other acute fracture seen on all other x-rays   PROCEDURES:  Critical Care performed: No  .Reduction of dislocation  Date/Time: 10/30/2023 6:08 PM  Performed by: Kandee Orion, MD Authorized by: Kandee Orion, MD  Consent: Verbal consent obtained. Risks and benefits: risks, benefits and alternatives were discussed Consent given by: patient Required items: required blood products, implants, devices, and special equipment available Patient identity confirmed: arm band Time out: Immediately prior to procedure a "time out" was called to verify the correct patient, procedure, equipment, support staff and site/side marked as required. Preparation: Patient was prepped and draped in the usual sterile fashion. Comments: Finger dislocation      MEDICATIONS ORDERED IN ED: Medications  Tdap (BOOSTRIX) injection 0.5 mL (has no administration in time range)  lidocaine  (PF) (XYLOCAINE )  1 % injection 5 mL (5 mLs Intradermal Given 10/30/23 1703)     IMPRESSION / MDM / ASSESSMENT AND PLAN / ED COURSE  I reviewed the triage vital signs and the nursing notes.                              Differential diagnosis includes, but is not limited to, ICH, cervical spine injury, phalanx fracture, elbow fracture, ankle fracture, hip fracture, abrasions, finger dislocation  Patient's presentation is most consistent with acute complicated illness / injury requiring diagnostic workup.  Patient is a 83 year old female presenting today for ground-level fall.  Unclear injury to head and neck and so CT head and C-spine was ordered in triage.  This was negative for traumatic  injury.  Separately having pain complaints in her left ankle, left hand, left elbow, and left hip.  Only significant finding was dislocation to the fifth digit on her left hand.  Finger was successfully reduced at bedside and wrapped with the fourth digit.  Safer discharge.  Tetanus shot updated.  Will give follow-up with orthopedics in 1 week.     FINAL CLINICAL IMPRESSION(S) / ED DIAGNOSES   Final diagnoses:  Fall, initial encounter  Dislocation of finger, initial encounter     Rx / DC Orders   ED Discharge Orders          Ordered    AMB referral to orthopedics        10/30/23 1801             Note:  This document was prepared using Dragon voice recognition software and may include unintentional dictation errors.   Kandee Orion, MD 10/30/23 [phone]

## 2023-10-30 NOTE — ED Provider Triage Note (Signed)
 Emergency Medicine Provider Triage Evaluation Note  Patricia Pacheco , a 83 y.o. female  was evaluated in triage.  Pt complains of unwitnessed mechanical fall at home . Unsure if she hit her head. No LOC or anticoagulation use. Complains of left pinky, left elbow, left hip and left ankle pain. Deformity to left pinky noted.   Review of Systems  Positive:  Negative: LOC, nausea, vomiting, visual changes   Physical Exam  BP (!) 192/81 (BP Location: Right Arm)   Pulse 78   Temp 98.3 F (36.8 C) (Oral)   Resp 20   Ht 5\' 3"  (1.6 m)   Wt 57.6 kg   LMP 08/29/1995   SpO2 99%   BMI 22.50 kg/m  Gen:   Awake, no distress   Resp:  Normal effort  MSK:   Moves extremities without difficulty  Other:  Left pinky reveals deformity. Multiple skin tears. Neurovascular status intact. Left ankle mildly swollen.   Medical Decision Making  Medically screening exam initiated at 3:34 PM.  Appropriate orders placed.  Darrielle H Sawtelle was informed that the remainder of the evaluation will be completed by another provider, this initial triage assessment does not replace that evaluation, and the importance of remaining in the ED until their evaluation is complete.    Phyllis Breeze, Synthia Fairbank A, PA-C 10/30/23 1537

## 2023-10-30 NOTE — Discharge Instructions (Addendum)
 You were seen in the ED today for your fall.  CT imaging of your head and cervical spine were normal with no injuries.  X-rays only revealed a dislocation of the fifth finger on your left hand.  It was successfully reduced today.  Please keep it wrapped until you follow-up with orthopedics for reassessment within a week.  I have sent a follow-up appointment for you to see orthopedics within the week.

## 2023-10-30 NOTE — ED Triage Notes (Signed)
 Pt to ED with daughter, POV for mechanical fall while walking up stairs. States fell on to L side. Pt has skin tears to L hand and visibly deformed L pinkie finger. Pt has skin tear to L eblow and complains of elbow pain.

## 2023-12-10 ENCOUNTER — Ambulatory Visit: Payer: Self-pay

## 2023-12-10 NOTE — Telephone Encounter (Signed)
 FYI Only or Action Required?: FYI only for provider  Patient was last seen in primary care on 10/04/2023 by Tona Francis, NP. Called Nurse Triage reporting Altered Mental Status. Symptoms began several months ago. Interventions attempted: Nothing. Symptoms are: gradually worsening.  Triage Disposition: See PCP When Office is Open (Within 3 Days)  Patient/caregiver understands and will follow disposition?: Yes    Copied from CRM [id number]. Topic: Clinical - Red Word Triage >> Dec 10, 2023  1:54 PM Alyse July wrote: Red Word that prompted transfer to Nurse Triage: Worsening confusion Reason for Disposition  MODERATE anxiety (e.g., persistent or frequent anxiety symptoms; interferes with sleep, school, or work)  [1] Longstanding confusion (e.g., dementia, stroke) AND [2] NO worsening or change  Answer Assessment - Initial Assessment Questions 1. LEVEL OF CONSCIOUSNESS: How is he (she, the patient) acting right now? (e.g., alert-oriented, confused, lethargic, stuporous, comatose)     Very confused, anxiety 2. ONSET: When did the confusion start?  (minutes, hours, days)     Ongoing and worsening x 5 months 3. PATTERN Does this come and go, or has it been constant since it started?  Is it present now?     constant 4. ALCOHOL or DRUGS: Has he been drinking alcohol or taking any drugs?      N/a 5. NARCOTIC MEDICINES: Has he been receiving any narcotic medications? (e.g., morphine, Vicodin)     N/a 6. CAUSE: What do you think is causing the confusion?      dementia 7. OTHER SYMPTOMS: Are there any other symptoms? (e.g., difficulty breathing, headache, fever, weakness)     Anxiety and nervousness  Placed pt in assisted living center.  Pt is anxious, very confused.  Short term memory is gone.  Answer Assessment - Initial Assessment Questions 1. CONCERN: Did anything happen that prompted you to call today?      Increased anxiety and nervousness 2. ANXIETY SYMPTOMS: Can  you describe how you (your loved one; patient) have been feeling? (e.g., tense, restless, panicky, anxious, keyed up, overwhelmed, sense of impending doom).      Restless, anxious 3. ONSET: How long have you been feeling this way? (e.g., hours, days, weeks)     Ongoing and worsening 4. SEVERITY: How would you rate the level of anxiety? (e.g., 0 - 10; or mild, moderate, severe).     moderate 5. FUNCTIONAL IMPAIRMENT: How have these feelings affected your ability to do daily activities? Have you had more difficulty than usual doing your normal daily activities? (e.g., getting better, same, worse; self-care, school, work, interactions)     yes 6. HISTORY: Have you felt this way before? Have you ever been diagnosed with an anxiety problem in the past? (e.g., generalized anxiety disorder, panic attacks, PTSD). If Yes, ask: How was this problem treated? (e.g., medicines, counseling, etc.)     N/a 7. RISK OF HARM - SUICIDAL IDEATION: Do you ever have thoughts of hurting or killing yourself? If Yes, ask:  Do you have these feelings now? Do you have a plan on how you would do this?     no 8. TREATMENT:  What has been done so far to treat this anxiety? (e.g., medicines, relaxation strategies). What has helped?     no 9. TREATMENT - THERAPIST: Do you have a counselor or therapist? Name?     no 10. POTENTIAL TRIGGERS: Do you drink caffeinated beverages (e.g., coffee, colas, teas), and how much daily? Do you drink alcohol or use any drugs? Have you started  any new medicines recently?       no 11. PATIENT SUPPORT: Who is with you now? Who do you live with? Do you have family or friends who you can talk to?        Yes - children 12. OTHER SYMPTOMS: Do you have any other symptoms? (e.g., feeling depressed, trouble concentrating, trouble sleeping, trouble breathing, palpitations or fast heartbeat, chest pain, sweating, nausea, or diarrhea)       Trouble sleeping  13.  PREGNANCY: Is there any chance you are pregnant? When was your last menstrual period?       N/a  Worse at night  Protocols used: Confusion - Delirium-A-AH, Anxiety and Panic Attack-A-AH

## 2023-12-11 ENCOUNTER — Encounter: Payer: Self-pay | Admitting: Internal Medicine

## 2023-12-11 ENCOUNTER — Ambulatory Visit: Admitting: Internal Medicine

## 2023-12-11 VITALS — BP 130/62 | HR 78 | Temp 98.1°F | Resp 20 | Ht 63.0 in | Wt 126.4 lb

## 2023-12-11 DIAGNOSIS — F419 Anxiety disorder, unspecified: Secondary | ICD-10-CM

## 2023-12-11 DIAGNOSIS — R410 Disorientation, unspecified: Secondary | ICD-10-CM | POA: Diagnosis not present

## 2023-12-11 DIAGNOSIS — F0390 Unspecified dementia without behavioral disturbance: Secondary | ICD-10-CM | POA: Insufficient documentation

## 2023-12-11 DIAGNOSIS — I10 Essential (primary) hypertension: Secondary | ICD-10-CM

## 2023-12-11 DIAGNOSIS — N3000 Acute cystitis without hematuria: Secondary | ICD-10-CM

## 2023-12-11 DIAGNOSIS — F03B4 Unspecified dementia, moderate, with anxiety: Secondary | ICD-10-CM

## 2023-12-11 LAB — POC URINALSYSI DIPSTICK (AUTOMATED)
Bilirubin, UA: NEGATIVE
Glucose, UA: NEGATIVE
Ketones, UA: NEGATIVE
Nitrite, UA: NEGATIVE
Protein, UA: POSITIVE — AB
Spec Grav, UA: 1.025 (ref 1.010–1.025)
Urobilinogen, UA: 0.2 U/dL
pH, UA: 6 (ref 5.0–8.0)

## 2023-12-11 MED ORDER — DONEPEZIL HCL 5 MG PO TABS: 5.0000 mg | ORAL_TABLET | Freq: Every day | ORAL | 0 refills | Status: DC

## 2023-12-11 MED ORDER — DULOXETINE HCL 20 MG PO CPEP: 20.0000 mg | ORAL_CAPSULE | Freq: Every day | ORAL | 0 refills | Status: AC

## 2023-12-11 NOTE — Assessment & Plan Note (Signed)
-   Patient does complain of some increased urinary frequency and occasional dysuria -UA done today showed moderate blood and trace leukocytes but negative nitrites -Will follow-up urine culture -Given the patient is symptomatic we will treat with antibiotics while awaiting urine culture -No further workup at this time

## 2023-12-11 NOTE — Assessment & Plan Note (Signed)
-   This problem is chronic and stable -Patient is not currently on any medication -Blood pressure today is 118/60 -No further workup at this time

## 2023-12-11 NOTE — Progress Notes (Signed)
 Acute Office Visit  Subjective:     Patient ID: Patricia Pacheco, female    DOB: 14-Jun-1941, 83 y.o.   MRN: [id number]  Chief Complaint  Patient presents with   Altered Mental Status   Anxiety    Husband died 01/01/2024. Confusion has got worse along with anxiety     Altered Mental Status  Anxiety Symptoms include nervous/anxious behavior.     Patient is in today for follow-up on memory loss.  Patient states that her memory is fine but her daughter states that patient has had progressively worsening memory over the last few years and had seen a neurologist in 2023 for this.  At that time patient was prescribed Aricept.  Patient has not been taking this medication.  Patient's husband passed away on 01-01-2024 but patient had to be reminded of this.  Patient's daughter states that patient gets anxious at night and has been calling them wondering why her furniture is in a room she does not recognize (she is now in independent living)  Patient also complains of increased urinary frequency and occasional dysuria but no fevers or chills or back pain  Review of Systems  Constitutional: Negative.   HENT: Negative.    Respiratory: Negative.    Cardiovascular: Negative.   Genitourinary:  Positive for dysuria and frequency. Negative for flank pain, hematuria and urgency.  Neurological:        Patient with progressively worsening memory loss over the last 2 years.  P  Psychiatric/Behavioral:  The patient is nervous/anxious.         Objective:    BP 130/62   Pulse 78   Temp 98.1 F (36.7 C)   Resp 20   Ht 5' 3 (1.6 m)   Wt 126 lb 6 oz (57.3 kg)   LMP 08/29/1995   SpO2 97%   BMI 22.39 kg/m    Physical Exam Constitutional:      Appearance: Normal appearance.  HENT:     Head: Normocephalic and atraumatic.   Cardiovascular:     Rate and Rhythm: Normal rate and regular rhythm.     Heart sounds: Normal heart sounds.  Pulmonary:     Breath sounds: Normal breath sounds. No  wheezing or rales.   Neurological:     Mental Status: She is alert.     Comments: Mini cog score was 2.  Patient cannot remember that her husband passed away on 01-Jan-2024.  She did member the names of her 2 children but could not tell me the names of her grandchildren nor other ages.  The daughter states that they just discussed this 20 minutes ago.  Daughter also states that patient called him in the middle of the night because she does not know where she is  Psychiatric:     Comments: Patient's daughter states that patient has been anxious and waking up at night calling them     Results for orders placed or performed in visit on 12/11/23  POCT Urinalysis Dipstick (Automated)  Result Value Ref Range   Color, UA yellow    Clarity, UA clear    Glucose, UA Negative Negative   Bilirubin, UA Negative    Ketones, UA Negative    Spec Grav, UA 1.025 1.010 - 1.025   Blood, UA Moderate    pH, UA 6.0 5.0 - 8.0   Protein, UA Positive (A) Negative   Urobilinogen, UA 0.2 0.2 or 1.0 E.U./dL   Nitrite, UA Negative    Leukocytes, UA  Trace (A) Negative        Assessment & Plan:   Problem List Items Addressed This Visit       Cardiovascular and Mediastinum   Essential hypertension   - This problem is chronic and stable -Patient is not currently on any medication -Blood pressure today is 118/60 -No further workup at this time        Nervous and Auditory   Dementia [facility])   - Patient has a history of dementia that was diagnosed 2 years ago by neurology -Mini cog score today was 2 -Patient could not remember the names of her granddaughters but did remember the names of her children.  She forgot that her husband passed away on 2023-12-30 -Daughter states the patient makes in the middle of the night and calls them because she is confused as to where she is -Patient was prescribed Aricept by neurology in 2023 but is not taking this medication.  This is represcribed to her today -New neurology  referral placed to Dr. Mason Sole who saw her previously -Will follow-up recommendations -Will check TSH and B12 today -No further workup at this      Relevant Medications   donepezil (ARICEPT) 5 MG tablet   DULoxetine (CYMBALTA) 20 MG capsule   Other Relevant Orders   Vitamin B12   TSH   Ambulatory referral to Neurology     Genitourinary   UTI (urinary tract infection)   - Patient does complain of some increased urinary frequency and occasional dysuria -UA done today showed moderate blood and trace leukocytes but negative nitrites -Will follow-up urine culture -Given the patient is symptomatic we will treat with antibiotics while awaiting urine culture -No further workup at this time        Other   Anxiety   - Patient daughter states that patient has worsening anxiety -Her husband passed away on 12-30-23 with patient does not remember this -Patient GAD 7 score today is significantly elevated -13 and PHQ-9 score was 7 - Will start the patient on Cymbalta per Dr. Thayne Fine previous recommendation -No further workup at this time      Relevant Medications   DULoxetine (CYMBALTA) 20 MG capsule   Other Relevant Orders   Vitamin B12   TSH   Other Visit Diagnoses       Confusion    -  Primary   Relevant Orders   POCT Urinalysis Dipstick (Automated) (Completed)   Urine Culture   Vitamin B12   TSH       Meds ordered this encounter  Medications   donepezil (ARICEPT) 5 MG tablet    Sig: Take 1 tablet (5 mg total) by mouth at bedtime.    Dispense:  90 tablet    Refill:  0   DULoxetine (CYMBALTA) 20 MG capsule    Sig: Take 1 capsule (20 mg total) by mouth daily.    Dispense:  30 capsule    Refill:  0    No follow-ups on file.  Phuoc Huy, MD

## 2023-12-11 NOTE — Patient Instructions (Addendum)
-   It was a pleasure meeting you today -I put in a referral to your neurologist and requested that it be within 1 week -Have also restarted the Aricept that the neurologist had recommended for you -I will support you on Cymbalta 20 mg daily to help with your anxiety -I put in for some blood work today including a B12 and a TSH -Will also check a urine culture to make sure that she does not have an underlying urinary tract infection contributing to her symptoms -Please contact us  with any questions or concerns

## 2023-12-11 NOTE — Assessment & Plan Note (Signed)
-   Patient daughter states that patient has worsening anxiety -Her husband passed away on 2024/01/12 with patient does not remember this -Patient GAD 7 score today is significantly elevated -13 and PHQ-9 score was 7 - Will start the patient on Cymbalta per Dr. Thayne Fine previous recommendation -No further workup at this time

## 2023-12-11 NOTE — Assessment & Plan Note (Signed)
-   Patient has a history of dementia that was diagnosed 2 years ago by neurology -Mini cog score today was 2 -Patient could not remember the names of her granddaughters but did remember the names of her children.  She forgot that her husband passed away on January 07, 2024 -Daughter states the patient makes in the middle of the night and calls them because she is confused as to where she is -Patient was prescribed Aricept by neurology in 2023 but is not taking this medication.  This is represcribed to her today -New neurology referral placed to Dr. Mason Sole who saw her previously -Will follow-up recommendations -Will check TSH and B12 today -No further workup at this

## 2023-12-12 LAB — URINE CULTURE
MICRO NUMBER:: 16596393
SPECIMEN QUALITY:: ADEQUATE

## 2023-12-12 LAB — TSH: TSH: 2.7 u[IU]/mL (ref 0.35–5.50)

## 2023-12-12 LAB — VITAMIN B12: Vitamin B-12: 410 pg/mL (ref 211–911)

## 2023-12-13 ENCOUNTER — Other Ambulatory Visit: Payer: Self-pay | Admitting: Nurse Practitioner

## 2023-12-13 ENCOUNTER — Telehealth: Payer: Self-pay

## 2023-12-13 ENCOUNTER — Ambulatory Visit: Payer: Self-pay | Admitting: Internal Medicine

## 2023-12-13 DIAGNOSIS — F03B18 Unspecified dementia, moderate, with other behavioral disturbance: Secondary | ICD-10-CM

## 2023-12-13 MED ORDER — QUETIAPINE FUMARATE 25 MG PO TABS: 25.0000 mg | ORAL_TABLET | Freq: Every day | ORAL | 0 refills | Status: AC

## 2023-12-13 NOTE — Telephone Encounter (Signed)
 Sending to doctor of day:    Spoke to pt's daughter Amy about lab results. While discussing labs Amy stated that pt is having a hard time with the new adjustments. Pt lost her husband and now in a living facility. Daughter stated that since pt has been at the facility she has received multiple calls about pt being frantic, aggiated, emotional, lost and confused. Daughter states pt is only taking Cymbalta and Aricept.daughter wants to know is there anything that can be sent in to help pt til other meds begin to work, she doesn't want any meds that are sedating due to pt being a high fall risk. She stated that they are planning to have pts husband this weekend and would really like something to help pt get through

## 2023-12-15 NOTE — Telephone Encounter (Signed)
 Please call and clarify - per reviewe, Ms Face is now living in independent living. Please confirm. Also, was started on seroquel . Please confim current living situation and if doing better.

## 2023-12-16 MED ORDER — MEMANTINE HCL 5 MG PO TABS: 5.0000 mg | ORAL_TABLET | Freq: Every day | ORAL | 0 refills | Status: DC

## 2023-12-16 NOTE — Telephone Encounter (Signed)
 Dr Onesimo talked with patients daughter. Per note, She is in independent living. Dr Onesimo is adjusting medications.

## 2024-01-07 DIAGNOSIS — R2689 Other abnormalities of gait and mobility: Secondary | ICD-10-CM | POA: Diagnosis not present

## 2024-01-07 DIAGNOSIS — R7309 Other abnormal glucose: Secondary | ICD-10-CM | POA: Diagnosis not present

## 2024-01-07 DIAGNOSIS — Z1331 Encounter for screening for depression: Secondary | ICD-10-CM | POA: Diagnosis not present

## 2024-01-07 DIAGNOSIS — G608 Other hereditary and idiopathic neuropathies: Secondary | ICD-10-CM | POA: Diagnosis not present

## 2024-01-07 DIAGNOSIS — R4189 Other symptoms and signs involving cognitive functions and awareness: Secondary | ICD-10-CM | POA: Diagnosis not present

## 2024-01-09 ENCOUNTER — Other Ambulatory Visit: Payer: Self-pay | Admitting: Nurse Practitioner

## 2024-01-09 DIAGNOSIS — F03B18 Unspecified dementia, moderate, with other behavioral disturbance: Secondary | ICD-10-CM

## 2024-01-10 DIAGNOSIS — R4789 Other speech disturbances: Secondary | ICD-10-CM | POA: Diagnosis not present

## 2024-01-10 DIAGNOSIS — F02818 Dementia in other diseases classified elsewhere, unspecified severity, with other behavioral disturbance: Secondary | ICD-10-CM | POA: Diagnosis not present

## 2024-01-10 DIAGNOSIS — R531 Weakness: Secondary | ICD-10-CM | POA: Diagnosis not present

## 2024-01-10 DIAGNOSIS — R488 Other symbolic dysfunctions: Secondary | ICD-10-CM | POA: Diagnosis not present

## 2024-01-13 DIAGNOSIS — R531 Weakness: Secondary | ICD-10-CM | POA: Diagnosis not present

## 2024-01-15 DIAGNOSIS — R531 Weakness: Secondary | ICD-10-CM | POA: Diagnosis not present

## 2024-01-16 ENCOUNTER — Other Ambulatory Visit: Payer: Self-pay | Admitting: Nurse Practitioner

## 2024-01-16 DIAGNOSIS — R4789 Other speech disturbances: Secondary | ICD-10-CM | POA: Diagnosis not present

## 2024-01-16 DIAGNOSIS — F03B18 Unspecified dementia, moderate, with other behavioral disturbance: Secondary | ICD-10-CM

## 2024-01-16 DIAGNOSIS — R488 Other symbolic dysfunctions: Secondary | ICD-10-CM | POA: Diagnosis not present

## 2024-01-16 DIAGNOSIS — F02818 Dementia in other diseases classified elsewhere, unspecified severity, with other behavioral disturbance: Secondary | ICD-10-CM | POA: Diagnosis not present

## 2024-01-16 NOTE — Telephone Encounter (Signed)
 Please call and confirm if she is taking seroquel  regularly and confirm f/u.

## 2024-01-16 NOTE — Telephone Encounter (Signed)
 Called and left message for son but this message is documented in her chart from 7/18:  Sending for provider refusal. Called and spoke to pts daughter who is on her DPR. Called to get pt scheduled a follow up appt with PCP Ms. Amy informed me that pt is now ina facility and that she just saw a PA @ Dr. Keene office who is increasing/ taking over the seroquel  and cymbalta  for the pt. She stated she will eventually see about an appt but would like for Dr. Glendia to be aware.

## 2024-01-17 DIAGNOSIS — R531 Weakness: Secondary | ICD-10-CM | POA: Diagnosis not present

## 2024-01-20 DIAGNOSIS — R531 Weakness: Secondary | ICD-10-CM | POA: Diagnosis not present

## 2024-01-21 DIAGNOSIS — R531 Weakness: Secondary | ICD-10-CM | POA: Diagnosis not present

## 2024-01-22 DIAGNOSIS — R531 Weakness: Secondary | ICD-10-CM | POA: Diagnosis not present

## 2024-01-23 DIAGNOSIS — F02818 Dementia in other diseases classified elsewhere, unspecified severity, with other behavioral disturbance: Secondary | ICD-10-CM | POA: Diagnosis not present

## 2024-01-23 DIAGNOSIS — R488 Other symbolic dysfunctions: Secondary | ICD-10-CM | POA: Diagnosis not present

## 2024-01-23 DIAGNOSIS — R4789 Other speech disturbances: Secondary | ICD-10-CM | POA: Diagnosis not present

## 2024-01-24 DIAGNOSIS — R488 Other symbolic dysfunctions: Secondary | ICD-10-CM | POA: Diagnosis not present

## 2024-01-24 DIAGNOSIS — F02818 Dementia in other diseases classified elsewhere, unspecified severity, with other behavioral disturbance: Secondary | ICD-10-CM | POA: Diagnosis not present

## 2024-01-24 DIAGNOSIS — R4789 Other speech disturbances: Secondary | ICD-10-CM | POA: Diagnosis not present

## 2024-01-27 DIAGNOSIS — R531 Weakness: Secondary | ICD-10-CM | POA: Diagnosis not present

## 2024-01-29 DIAGNOSIS — R531 Weakness: Secondary | ICD-10-CM | POA: Diagnosis not present

## 2024-01-30 DIAGNOSIS — R4789 Other speech disturbances: Secondary | ICD-10-CM | POA: Diagnosis not present

## 2024-01-30 DIAGNOSIS — R488 Other symbolic dysfunctions: Secondary | ICD-10-CM | POA: Diagnosis not present

## 2024-01-30 DIAGNOSIS — F02818 Dementia in other diseases classified elsewhere, unspecified severity, with other behavioral disturbance: Secondary | ICD-10-CM | POA: Diagnosis not present

## 2024-01-31 DIAGNOSIS — R4789 Other speech disturbances: Secondary | ICD-10-CM | POA: Diagnosis not present

## 2024-01-31 DIAGNOSIS — R488 Other symbolic dysfunctions: Secondary | ICD-10-CM | POA: Diagnosis not present

## 2024-01-31 DIAGNOSIS — F02818 Dementia in other diseases classified elsewhere, unspecified severity, with other behavioral disturbance: Secondary | ICD-10-CM | POA: Diagnosis not present

## 2024-02-03 DIAGNOSIS — F02818 Dementia in other diseases classified elsewhere, unspecified severity, with other behavioral disturbance: Secondary | ICD-10-CM | POA: Diagnosis not present

## 2024-02-03 DIAGNOSIS — R488 Other symbolic dysfunctions: Secondary | ICD-10-CM | POA: Diagnosis not present

## 2024-02-03 DIAGNOSIS — R4789 Other speech disturbances: Secondary | ICD-10-CM | POA: Diagnosis not present

## 2024-02-04 ENCOUNTER — Other Ambulatory Visit: Payer: Self-pay | Admitting: Internal Medicine

## 2024-02-04 DIAGNOSIS — R531 Weakness: Secondary | ICD-10-CM | POA: Diagnosis not present

## 2024-02-04 DIAGNOSIS — F03B18 Unspecified dementia, moderate, with other behavioral disturbance: Secondary | ICD-10-CM

## 2024-02-05 DIAGNOSIS — D485 Neoplasm of uncertain behavior of skin: Secondary | ICD-10-CM | POA: Diagnosis not present

## 2024-02-05 DIAGNOSIS — C44729 Squamous cell carcinoma of skin of left lower limb, including hip: Secondary | ICD-10-CM | POA: Diagnosis not present

## 2024-02-05 DIAGNOSIS — R531 Weakness: Secondary | ICD-10-CM | POA: Diagnosis not present

## 2024-02-06 DIAGNOSIS — R4789 Other speech disturbances: Secondary | ICD-10-CM | POA: Diagnosis not present

## 2024-02-06 DIAGNOSIS — F02818 Dementia in other diseases classified elsewhere, unspecified severity, with other behavioral disturbance: Secondary | ICD-10-CM | POA: Diagnosis not present

## 2024-02-06 DIAGNOSIS — R531 Weakness: Secondary | ICD-10-CM | POA: Diagnosis not present

## 2024-02-06 DIAGNOSIS — R488 Other symbolic dysfunctions: Secondary | ICD-10-CM | POA: Diagnosis not present

## 2024-02-11 DIAGNOSIS — R531 Weakness: Secondary | ICD-10-CM | POA: Diagnosis not present

## 2024-02-12 DIAGNOSIS — R531 Weakness: Secondary | ICD-10-CM | POA: Diagnosis not present

## 2024-02-17 DIAGNOSIS — R531 Weakness: Secondary | ICD-10-CM | POA: Diagnosis not present

## 2024-02-18 DIAGNOSIS — F02818 Dementia in other diseases classified elsewhere, unspecified severity, with other behavioral disturbance: Secondary | ICD-10-CM | POA: Diagnosis not present

## 2024-02-18 DIAGNOSIS — R488 Other symbolic dysfunctions: Secondary | ICD-10-CM | POA: Diagnosis not present

## 2024-02-18 DIAGNOSIS — R4789 Other speech disturbances: Secondary | ICD-10-CM | POA: Diagnosis not present

## 2024-02-18 DIAGNOSIS — R531 Weakness: Secondary | ICD-10-CM | POA: Diagnosis not present

## 2024-02-19 DIAGNOSIS — R531 Weakness: Secondary | ICD-10-CM | POA: Diagnosis not present

## 2024-02-20 DIAGNOSIS — R488 Other symbolic dysfunctions: Secondary | ICD-10-CM | POA: Diagnosis not present

## 2024-02-20 DIAGNOSIS — R4789 Other speech disturbances: Secondary | ICD-10-CM | POA: Diagnosis not present

## 2024-02-20 DIAGNOSIS — F02818 Dementia in other diseases classified elsewhere, unspecified severity, with other behavioral disturbance: Secondary | ICD-10-CM | POA: Diagnosis not present

## 2024-02-20 DIAGNOSIS — R531 Weakness: Secondary | ICD-10-CM | POA: Diagnosis not present

## 2024-02-21 DIAGNOSIS — F02818 Dementia in other diseases classified elsewhere, unspecified severity, with other behavioral disturbance: Secondary | ICD-10-CM | POA: Diagnosis not present

## 2024-02-21 DIAGNOSIS — R4789 Other speech disturbances: Secondary | ICD-10-CM | POA: Diagnosis not present

## 2024-02-21 DIAGNOSIS — R488 Other symbolic dysfunctions: Secondary | ICD-10-CM | POA: Diagnosis not present

## 2024-02-24 DIAGNOSIS — R4789 Other speech disturbances: Secondary | ICD-10-CM | POA: Diagnosis not present

## 2024-02-24 DIAGNOSIS — R488 Other symbolic dysfunctions: Secondary | ICD-10-CM | POA: Diagnosis not present

## 2024-02-24 DIAGNOSIS — F02818 Dementia in other diseases classified elsewhere, unspecified severity, with other behavioral disturbance: Secondary | ICD-10-CM | POA: Diagnosis not present

## 2024-02-25 DIAGNOSIS — R488 Other symbolic dysfunctions: Secondary | ICD-10-CM | POA: Diagnosis not present

## 2024-02-25 DIAGNOSIS — R4789 Other speech disturbances: Secondary | ICD-10-CM | POA: Diagnosis not present

## 2024-02-25 DIAGNOSIS — R531 Weakness: Secondary | ICD-10-CM | POA: Diagnosis not present

## 2024-02-25 DIAGNOSIS — F02818 Dementia in other diseases classified elsewhere, unspecified severity, with other behavioral disturbance: Secondary | ICD-10-CM | POA: Diagnosis not present

## 2024-03-03 DIAGNOSIS — R4789 Other speech disturbances: Secondary | ICD-10-CM | POA: Diagnosis not present

## 2024-03-03 DIAGNOSIS — R488 Other symbolic dysfunctions: Secondary | ICD-10-CM | POA: Diagnosis not present

## 2024-03-03 DIAGNOSIS — R531 Weakness: Secondary | ICD-10-CM | POA: Diagnosis not present

## 2024-03-03 DIAGNOSIS — F02818 Dementia in other diseases classified elsewhere, unspecified severity, with other behavioral disturbance: Secondary | ICD-10-CM | POA: Diagnosis not present

## 2024-03-04 DIAGNOSIS — R531 Weakness: Secondary | ICD-10-CM | POA: Diagnosis not present

## 2024-03-05 DIAGNOSIS — R4789 Other speech disturbances: Secondary | ICD-10-CM | POA: Diagnosis not present

## 2024-03-05 DIAGNOSIS — F02818 Dementia in other diseases classified elsewhere, unspecified severity, with other behavioral disturbance: Secondary | ICD-10-CM | POA: Diagnosis not present

## 2024-03-05 DIAGNOSIS — R488 Other symbolic dysfunctions: Secondary | ICD-10-CM | POA: Diagnosis not present

## 2024-03-06 DIAGNOSIS — R531 Weakness: Secondary | ICD-10-CM | POA: Diagnosis not present

## 2024-03-09 DIAGNOSIS — R488 Other symbolic dysfunctions: Secondary | ICD-10-CM | POA: Diagnosis not present

## 2024-03-09 DIAGNOSIS — F02818 Dementia in other diseases classified elsewhere, unspecified severity, with other behavioral disturbance: Secondary | ICD-10-CM | POA: Diagnosis not present

## 2024-03-09 DIAGNOSIS — R4789 Other speech disturbances: Secondary | ICD-10-CM | POA: Diagnosis not present

## 2024-03-09 DIAGNOSIS — R531 Weakness: Secondary | ICD-10-CM | POA: Diagnosis not present

## 2024-03-10 DIAGNOSIS — R531 Weakness: Secondary | ICD-10-CM | POA: Diagnosis not present

## 2024-03-12 DIAGNOSIS — F02818 Dementia in other diseases classified elsewhere, unspecified severity, with other behavioral disturbance: Secondary | ICD-10-CM | POA: Diagnosis not present

## 2024-03-12 DIAGNOSIS — R488 Other symbolic dysfunctions: Secondary | ICD-10-CM | POA: Diagnosis not present

## 2024-03-12 DIAGNOSIS — R4789 Other speech disturbances: Secondary | ICD-10-CM | POA: Diagnosis not present

## 2024-03-13 DIAGNOSIS — R531 Weakness: Secondary | ICD-10-CM | POA: Diagnosis not present

## 2024-03-16 DIAGNOSIS — R531 Weakness: Secondary | ICD-10-CM | POA: Diagnosis not present
